# Patient Record
Sex: Male | Born: 1945 | Race: White | Hispanic: No | Marital: Married
Health system: Southern US, Community
[De-identification: ages and names within clinical notes are randomized; demographics above are authoritative.]

## PROBLEM LIST (undated history)

## (undated) DIAGNOSIS — N184 Chronic kidney disease, stage 4 (severe): Secondary | ICD-10-CM

## (undated) DIAGNOSIS — G4733 Obstructive sleep apnea (adult) (pediatric): Secondary | ICD-10-CM

## (undated) DIAGNOSIS — I2510 Atherosclerotic heart disease of native coronary artery without angina pectoris: Secondary | ICD-10-CM

## (undated) DIAGNOSIS — Z9989 Dependence on other enabling machines and devices: Secondary | ICD-10-CM

## (undated) DIAGNOSIS — I6529 Occlusion and stenosis of unspecified carotid artery: Secondary | ICD-10-CM

## (undated) DIAGNOSIS — I5032 Chronic diastolic (congestive) heart failure: Secondary | ICD-10-CM

## (undated) HISTORY — PX: CORONARY ARTERY BYPASS GRAFT: SHX141

## (undated) HISTORY — PX: TONSILLECTOMY: SUR1361

---

## 2015-02-28 DIAGNOSIS — H40013 Open angle with borderline findings, low risk, bilateral: Secondary | ICD-10-CM | POA: Diagnosis not present

## 2015-02-28 DIAGNOSIS — H47323 Drusen of optic disc, bilateral: Secondary | ICD-10-CM | POA: Diagnosis not present

## 2015-03-21 DIAGNOSIS — N189 Chronic kidney disease, unspecified: Secondary | ICD-10-CM | POA: Diagnosis not present

## 2015-03-21 DIAGNOSIS — G4733 Obstructive sleep apnea (adult) (pediatric): Secondary | ICD-10-CM | POA: Diagnosis not present

## 2015-03-21 DIAGNOSIS — Z6839 Body mass index (BMI) 39.0-39.9, adult: Secondary | ICD-10-CM | POA: Diagnosis not present

## 2015-03-21 DIAGNOSIS — I10 Essential (primary) hypertension: Secondary | ICD-10-CM | POA: Diagnosis not present

## 2015-03-21 DIAGNOSIS — R05 Cough: Secondary | ICD-10-CM | POA: Diagnosis not present

## 2015-04-13 DIAGNOSIS — Z79899 Other long term (current) drug therapy: Secondary | ICD-10-CM | POA: Diagnosis not present

## 2015-04-13 DIAGNOSIS — E7800 Pure hypercholesterolemia, unspecified: Secondary | ICD-10-CM | POA: Diagnosis not present

## 2015-04-13 DIAGNOSIS — J209 Acute bronchitis, unspecified: Secondary | ICD-10-CM | POA: Diagnosis not present

## 2015-04-13 DIAGNOSIS — Z7982 Long term (current) use of aspirin: Secondary | ICD-10-CM | POA: Diagnosis not present

## 2015-04-13 DIAGNOSIS — I10 Essential (primary) hypertension: Secondary | ICD-10-CM | POA: Diagnosis not present

## 2015-04-13 DIAGNOSIS — J40 Bronchitis, not specified as acute or chronic: Secondary | ICD-10-CM | POA: Diagnosis not present

## 2015-04-13 DIAGNOSIS — Z7901 Long term (current) use of anticoagulants: Secondary | ICD-10-CM | POA: Diagnosis not present

## 2015-04-13 DIAGNOSIS — R509 Fever, unspecified: Secondary | ICD-10-CM | POA: Diagnosis not present

## 2015-04-13 DIAGNOSIS — R05 Cough: Secondary | ICD-10-CM | POA: Diagnosis not present

## 2015-04-13 DIAGNOSIS — I252 Old myocardial infarction: Secondary | ICD-10-CM | POA: Diagnosis not present

## 2015-05-01 DIAGNOSIS — R9431 Abnormal electrocardiogram [ECG] [EKG]: Secondary | ICD-10-CM | POA: Diagnosis not present

## 2015-05-01 DIAGNOSIS — I2510 Atherosclerotic heart disease of native coronary artery without angina pectoris: Secondary | ICD-10-CM | POA: Diagnosis not present

## 2015-05-01 DIAGNOSIS — I209 Angina pectoris, unspecified: Secondary | ICD-10-CM | POA: Diagnosis not present

## 2015-05-01 DIAGNOSIS — I340 Nonrheumatic mitral (valve) insufficiency: Secondary | ICD-10-CM | POA: Diagnosis not present

## 2015-05-11 DIAGNOSIS — L400 Psoriasis vulgaris: Secondary | ICD-10-CM | POA: Diagnosis not present

## 2015-05-11 DIAGNOSIS — L4059 Other psoriatic arthropathy: Secondary | ICD-10-CM | POA: Diagnosis not present

## 2015-07-24 DIAGNOSIS — Z79899 Other long term (current) drug therapy: Secondary | ICD-10-CM | POA: Diagnosis not present

## 2015-07-24 DIAGNOSIS — E785 Hyperlipidemia, unspecified: Secondary | ICD-10-CM | POA: Diagnosis not present

## 2015-08-09 DIAGNOSIS — Z8669 Personal history of other diseases of the nervous system and sense organs: Secondary | ICD-10-CM | POA: Diagnosis not present

## 2015-08-09 DIAGNOSIS — L4059 Other psoriatic arthropathy: Secondary | ICD-10-CM | POA: Diagnosis not present

## 2015-08-09 DIAGNOSIS — H2513 Age-related nuclear cataract, bilateral: Secondary | ICD-10-CM | POA: Diagnosis not present

## 2015-08-09 DIAGNOSIS — H40013 Open angle with borderline findings, low risk, bilateral: Secondary | ICD-10-CM | POA: Diagnosis not present

## 2015-08-09 DIAGNOSIS — H47323 Drusen of optic disc, bilateral: Secondary | ICD-10-CM | POA: Diagnosis not present

## 2015-10-10 DIAGNOSIS — Z0000 Encounter for general adult medical examination without abnormal findings: Secondary | ICD-10-CM | POA: Diagnosis not present

## 2015-10-10 DIAGNOSIS — N184 Chronic kidney disease, stage 4 (severe): Secondary | ICD-10-CM | POA: Diagnosis not present

## 2015-10-10 DIAGNOSIS — I2510 Atherosclerotic heart disease of native coronary artery without angina pectoris: Secondary | ICD-10-CM | POA: Diagnosis not present

## 2015-10-10 DIAGNOSIS — I10 Essential (primary) hypertension: Secondary | ICD-10-CM | POA: Diagnosis not present

## 2015-10-17 DIAGNOSIS — N184 Chronic kidney disease, stage 4 (severe): Secondary | ICD-10-CM | POA: Diagnosis not present

## 2015-10-17 DIAGNOSIS — I129 Hypertensive chronic kidney disease with stage 1 through stage 4 chronic kidney disease, or unspecified chronic kidney disease: Secondary | ICD-10-CM | POA: Diagnosis not present

## 2015-10-17 DIAGNOSIS — R739 Hyperglycemia, unspecified: Secondary | ICD-10-CM | POA: Diagnosis not present

## 2015-10-17 DIAGNOSIS — I2510 Atherosclerotic heart disease of native coronary artery without angina pectoris: Secondary | ICD-10-CM | POA: Diagnosis not present

## 2015-11-05 DIAGNOSIS — R739 Hyperglycemia, unspecified: Secondary | ICD-10-CM | POA: Diagnosis not present

## 2015-11-05 DIAGNOSIS — N281 Cyst of kidney, acquired: Secondary | ICD-10-CM | POA: Diagnosis not present

## 2015-11-05 DIAGNOSIS — I10 Essential (primary) hypertension: Secondary | ICD-10-CM | POA: Diagnosis not present

## 2015-11-05 DIAGNOSIS — I2510 Atherosclerotic heart disease of native coronary artery without angina pectoris: Secondary | ICD-10-CM | POA: Diagnosis not present

## 2015-11-05 DIAGNOSIS — R339 Retention of urine, unspecified: Secondary | ICD-10-CM | POA: Diagnosis not present

## 2015-11-05 DIAGNOSIS — N184 Chronic kidney disease, stage 4 (severe): Secondary | ICD-10-CM | POA: Diagnosis not present

## 2015-11-05 DIAGNOSIS — Z0000 Encounter for general adult medical examination without abnormal findings: Secondary | ICD-10-CM | POA: Diagnosis not present

## 2015-11-06 DIAGNOSIS — B078 Other viral warts: Secondary | ICD-10-CM | POA: Diagnosis not present

## 2015-11-06 DIAGNOSIS — L538 Other specified erythematous conditions: Secondary | ICD-10-CM | POA: Diagnosis not present

## 2015-11-06 DIAGNOSIS — L821 Other seborrheic keratosis: Secondary | ICD-10-CM | POA: Diagnosis not present

## 2015-11-06 DIAGNOSIS — R238 Other skin changes: Secondary | ICD-10-CM | POA: Diagnosis not present

## 2015-11-06 DIAGNOSIS — L4059 Other psoriatic arthropathy: Secondary | ICD-10-CM | POA: Diagnosis not present

## 2015-11-07 DIAGNOSIS — N184 Chronic kidney disease, stage 4 (severe): Secondary | ICD-10-CM | POA: Diagnosis not present

## 2015-11-07 DIAGNOSIS — I2510 Atherosclerotic heart disease of native coronary artery without angina pectoris: Secondary | ICD-10-CM | POA: Diagnosis not present

## 2015-11-07 DIAGNOSIS — I129 Hypertensive chronic kidney disease with stage 1 through stage 4 chronic kidney disease, or unspecified chronic kidney disease: Secondary | ICD-10-CM | POA: Diagnosis not present

## 2015-11-30 DIAGNOSIS — N184 Chronic kidney disease, stage 4 (severe): Secondary | ICD-10-CM | POA: Diagnosis not present

## 2015-11-30 DIAGNOSIS — I10 Essential (primary) hypertension: Secondary | ICD-10-CM | POA: Diagnosis not present

## 2015-12-05 DIAGNOSIS — I10 Essential (primary) hypertension: Secondary | ICD-10-CM | POA: Diagnosis not present

## 2015-12-05 DIAGNOSIS — N184 Chronic kidney disease, stage 4 (severe): Secondary | ICD-10-CM | POA: Diagnosis not present

## 2015-12-05 DIAGNOSIS — I2510 Atherosclerotic heart disease of native coronary artery without angina pectoris: Secondary | ICD-10-CM | POA: Diagnosis not present

## 2015-12-16 DIAGNOSIS — Z951 Presence of aortocoronary bypass graft: Secondary | ICD-10-CM | POA: Diagnosis not present

## 2015-12-16 DIAGNOSIS — Z7902 Long term (current) use of antithrombotics/antiplatelets: Secondary | ICD-10-CM | POA: Diagnosis not present

## 2015-12-16 DIAGNOSIS — Z7982 Long term (current) use of aspirin: Secondary | ICD-10-CM | POA: Diagnosis not present

## 2015-12-16 DIAGNOSIS — I252 Old myocardial infarction: Secondary | ICD-10-CM | POA: Diagnosis not present

## 2015-12-16 DIAGNOSIS — Z79899 Other long term (current) drug therapy: Secondary | ICD-10-CM | POA: Diagnosis not present

## 2015-12-16 DIAGNOSIS — Z888 Allergy status to other drugs, medicaments and biological substances status: Secondary | ICD-10-CM | POA: Diagnosis not present

## 2015-12-16 DIAGNOSIS — Z8679 Personal history of other diseases of the circulatory system: Secondary | ICD-10-CM | POA: Diagnosis not present

## 2015-12-16 DIAGNOSIS — Z955 Presence of coronary angioplasty implant and graft: Secondary | ICD-10-CM | POA: Diagnosis not present

## 2015-12-16 DIAGNOSIS — F419 Anxiety disorder, unspecified: Secondary | ICD-10-CM | POA: Diagnosis not present

## 2015-12-16 DIAGNOSIS — E7800 Pure hypercholesterolemia, unspecified: Secondary | ICD-10-CM | POA: Diagnosis not present

## 2015-12-16 DIAGNOSIS — H578 Other specified disorders of eye and adnexa: Secondary | ICD-10-CM | POA: Diagnosis not present

## 2015-12-16 DIAGNOSIS — F329 Major depressive disorder, single episode, unspecified: Secondary | ICD-10-CM | POA: Diagnosis not present

## 2015-12-16 DIAGNOSIS — H1132 Conjunctival hemorrhage, left eye: Secondary | ICD-10-CM | POA: Diagnosis not present

## 2015-12-18 DIAGNOSIS — Z23 Encounter for immunization: Secondary | ICD-10-CM | POA: Diagnosis not present

## 2016-01-01 DIAGNOSIS — R9431 Abnormal electrocardiogram [ECG] [EKG]: Secondary | ICD-10-CM | POA: Diagnosis not present

## 2016-01-01 DIAGNOSIS — I25111 Atherosclerotic heart disease of native coronary artery with angina pectoris with documented spasm: Secondary | ICD-10-CM | POA: Diagnosis not present

## 2016-01-01 DIAGNOSIS — Z951 Presence of aortocoronary bypass graft: Secondary | ICD-10-CM | POA: Diagnosis not present

## 2016-01-01 DIAGNOSIS — I10 Essential (primary) hypertension: Secondary | ICD-10-CM | POA: Diagnosis not present

## 2016-01-22 DIAGNOSIS — R0989 Other specified symptoms and signs involving the circulatory and respiratory systems: Secondary | ICD-10-CM | POA: Diagnosis not present

## 2016-02-04 DIAGNOSIS — H2513 Age-related nuclear cataract, bilateral: Secondary | ICD-10-CM | POA: Diagnosis not present

## 2016-02-04 DIAGNOSIS — H47323 Drusen of optic disc, bilateral: Secondary | ICD-10-CM | POA: Diagnosis not present

## 2016-02-04 DIAGNOSIS — H40013 Open angle with borderline findings, low risk, bilateral: Secondary | ICD-10-CM | POA: Diagnosis not present

## 2016-02-04 DIAGNOSIS — H35039 Hypertensive retinopathy, unspecified eye: Secondary | ICD-10-CM | POA: Diagnosis not present

## 2016-02-07 DIAGNOSIS — N184 Chronic kidney disease, stage 4 (severe): Secondary | ICD-10-CM | POA: Diagnosis not present

## 2016-02-07 DIAGNOSIS — E782 Mixed hyperlipidemia: Secondary | ICD-10-CM | POA: Diagnosis not present

## 2016-02-07 DIAGNOSIS — I6523 Occlusion and stenosis of bilateral carotid arteries: Secondary | ICD-10-CM | POA: Diagnosis not present

## 2016-02-07 DIAGNOSIS — Z951 Presence of aortocoronary bypass graft: Secondary | ICD-10-CM | POA: Diagnosis not present

## 2016-02-22 DIAGNOSIS — Z951 Presence of aortocoronary bypass graft: Secondary | ICD-10-CM | POA: Diagnosis not present

## 2016-02-22 DIAGNOSIS — N184 Chronic kidney disease, stage 4 (severe): Secondary | ICD-10-CM | POA: Diagnosis not present

## 2016-02-22 DIAGNOSIS — I6523 Occlusion and stenosis of bilateral carotid arteries: Secondary | ICD-10-CM | POA: Diagnosis not present

## 2016-02-22 DIAGNOSIS — E782 Mixed hyperlipidemia: Secondary | ICD-10-CM | POA: Diagnosis not present

## 2016-02-27 DIAGNOSIS — I10 Essential (primary) hypertension: Secondary | ICD-10-CM | POA: Diagnosis not present

## 2016-03-05 DIAGNOSIS — I6529 Occlusion and stenosis of unspecified carotid artery: Secondary | ICD-10-CM | POA: Diagnosis not present

## 2016-03-05 DIAGNOSIS — R809 Proteinuria, unspecified: Secondary | ICD-10-CM | POA: Diagnosis not present

## 2016-03-05 DIAGNOSIS — I129 Hypertensive chronic kidney disease with stage 1 through stage 4 chronic kidney disease, or unspecified chronic kidney disease: Secondary | ICD-10-CM | POA: Diagnosis not present

## 2016-03-05 DIAGNOSIS — N184 Chronic kidney disease, stage 4 (severe): Secondary | ICD-10-CM | POA: Diagnosis not present

## 2016-03-05 DIAGNOSIS — I2510 Atherosclerotic heart disease of native coronary artery without angina pectoris: Secondary | ICD-10-CM | POA: Diagnosis not present

## 2016-03-18 DIAGNOSIS — L910 Hypertrophic scar: Secondary | ICD-10-CM | POA: Diagnosis not present

## 2016-03-18 DIAGNOSIS — L400 Psoriasis vulgaris: Secondary | ICD-10-CM | POA: Diagnosis not present

## 2016-05-14 DIAGNOSIS — Z7982 Long term (current) use of aspirin: Secondary | ICD-10-CM | POA: Diagnosis not present

## 2016-05-14 DIAGNOSIS — J069 Acute upper respiratory infection, unspecified: Secondary | ICD-10-CM | POA: Diagnosis not present

## 2016-05-14 DIAGNOSIS — N186 End stage renal disease: Secondary | ICD-10-CM | POA: Diagnosis not present

## 2016-05-14 DIAGNOSIS — R05 Cough: Secondary | ICD-10-CM | POA: Diagnosis not present

## 2016-05-14 DIAGNOSIS — Z7902 Long term (current) use of antithrombotics/antiplatelets: Secondary | ICD-10-CM | POA: Diagnosis not present

## 2016-05-14 DIAGNOSIS — Z888 Allergy status to other drugs, medicaments and biological substances status: Secondary | ICD-10-CM | POA: Diagnosis not present

## 2016-05-14 DIAGNOSIS — Z951 Presence of aortocoronary bypass graft: Secondary | ICD-10-CM | POA: Diagnosis not present

## 2016-05-14 DIAGNOSIS — I252 Old myocardial infarction: Secondary | ICD-10-CM | POA: Diagnosis not present

## 2016-05-14 DIAGNOSIS — Z955 Presence of coronary angioplasty implant and graft: Secondary | ICD-10-CM | POA: Diagnosis not present

## 2016-05-14 DIAGNOSIS — Z8679 Personal history of other diseases of the circulatory system: Secondary | ICD-10-CM | POA: Diagnosis not present

## 2016-05-14 DIAGNOSIS — I120 Hypertensive chronic kidney disease with stage 5 chronic kidney disease or end stage renal disease: Secondary | ICD-10-CM | POA: Diagnosis not present

## 2016-05-14 DIAGNOSIS — Z87891 Personal history of nicotine dependence: Secondary | ICD-10-CM | POA: Diagnosis not present

## 2016-05-14 DIAGNOSIS — Z79899 Other long term (current) drug therapy: Secondary | ICD-10-CM | POA: Diagnosis not present

## 2016-05-20 DIAGNOSIS — I2510 Atherosclerotic heart disease of native coronary artery without angina pectoris: Secondary | ICD-10-CM | POA: Diagnosis not present

## 2016-05-20 DIAGNOSIS — E785 Hyperlipidemia, unspecified: Secondary | ICD-10-CM | POA: Diagnosis not present

## 2016-05-20 DIAGNOSIS — I509 Heart failure, unspecified: Secondary | ICD-10-CM | POA: Diagnosis not present

## 2016-05-20 DIAGNOSIS — S299XXA Unspecified injury of thorax, initial encounter: Secondary | ICD-10-CM | POA: Diagnosis not present

## 2016-05-20 DIAGNOSIS — I252 Old myocardial infarction: Secondary | ICD-10-CM | POA: Diagnosis not present

## 2016-05-20 DIAGNOSIS — J069 Acute upper respiratory infection, unspecified: Secondary | ICD-10-CM | POA: Diagnosis not present

## 2016-05-20 DIAGNOSIS — R0600 Dyspnea, unspecified: Secondary | ICD-10-CM | POA: Diagnosis not present

## 2016-05-20 DIAGNOSIS — R05 Cough: Secondary | ICD-10-CM | POA: Diagnosis not present

## 2016-05-20 DIAGNOSIS — N189 Chronic kidney disease, unspecified: Secondary | ICD-10-CM | POA: Diagnosis not present

## 2016-05-20 DIAGNOSIS — I130 Hypertensive heart and chronic kidney disease with heart failure and stage 1 through stage 4 chronic kidney disease, or unspecified chronic kidney disease: Secondary | ICD-10-CM | POA: Diagnosis not present

## 2016-05-23 DIAGNOSIS — N184 Chronic kidney disease, stage 4 (severe): Secondary | ICD-10-CM | POA: Diagnosis not present

## 2016-05-28 DIAGNOSIS — Z7902 Long term (current) use of antithrombotics/antiplatelets: Secondary | ICD-10-CM | POA: Diagnosis not present

## 2016-05-28 DIAGNOSIS — R1012 Left upper quadrant pain: Secondary | ICD-10-CM | POA: Diagnosis not present

## 2016-05-28 DIAGNOSIS — R51 Headache: Secondary | ICD-10-CM | POA: Diagnosis not present

## 2016-05-28 DIAGNOSIS — Z79899 Other long term (current) drug therapy: Secondary | ICD-10-CM | POA: Diagnosis not present

## 2016-05-28 DIAGNOSIS — J40 Bronchitis, not specified as acute or chronic: Secondary | ICD-10-CM | POA: Diagnosis not present

## 2016-05-28 DIAGNOSIS — J90 Pleural effusion, not elsewhere classified: Secondary | ICD-10-CM | POA: Diagnosis not present

## 2016-05-28 DIAGNOSIS — Z7982 Long term (current) use of aspirin: Secondary | ICD-10-CM | POA: Diagnosis not present

## 2016-05-28 DIAGNOSIS — R05 Cough: Secondary | ICD-10-CM | POA: Diagnosis not present

## 2016-05-28 DIAGNOSIS — R1110 Vomiting, unspecified: Secondary | ICD-10-CM | POA: Diagnosis not present

## 2016-05-28 DIAGNOSIS — R0600 Dyspnea, unspecified: Secondary | ICD-10-CM | POA: Diagnosis not present

## 2016-06-09 DIAGNOSIS — J209 Acute bronchitis, unspecified: Secondary | ICD-10-CM | POA: Diagnosis not present

## 2016-06-10 DIAGNOSIS — I130 Hypertensive heart and chronic kidney disease with heart failure and stage 1 through stage 4 chronic kidney disease, or unspecified chronic kidney disease: Secondary | ICD-10-CM | POA: Diagnosis not present

## 2016-06-10 DIAGNOSIS — Z6841 Body Mass Index (BMI) 40.0 and over, adult: Secondary | ICD-10-CM | POA: Diagnosis not present

## 2016-06-10 DIAGNOSIS — I5022 Chronic systolic (congestive) heart failure: Secondary | ICD-10-CM | POA: Diagnosis not present

## 2016-06-10 DIAGNOSIS — I25700 Atherosclerosis of coronary artery bypass graft(s), unspecified, with unstable angina pectoris: Secondary | ICD-10-CM | POA: Diagnosis not present

## 2016-06-10 DIAGNOSIS — R079 Chest pain, unspecified: Secondary | ICD-10-CM | POA: Diagnosis not present

## 2016-06-10 DIAGNOSIS — I214 Non-ST elevation (NSTEMI) myocardial infarction: Secondary | ICD-10-CM | POA: Diagnosis not present

## 2016-06-10 DIAGNOSIS — I25110 Atherosclerotic heart disease of native coronary artery with unstable angina pectoris: Secondary | ICD-10-CM | POA: Diagnosis not present

## 2016-06-10 DIAGNOSIS — I2510 Atherosclerotic heart disease of native coronary artery without angina pectoris: Secondary | ICD-10-CM | POA: Diagnosis not present

## 2016-06-10 DIAGNOSIS — I10 Essential (primary) hypertension: Secondary | ICD-10-CM | POA: Diagnosis not present

## 2016-06-10 DIAGNOSIS — E782 Mixed hyperlipidemia: Secondary | ICD-10-CM | POA: Diagnosis not present

## 2016-06-10 DIAGNOSIS — N184 Chronic kidney disease, stage 4 (severe): Secondary | ICD-10-CM | POA: Diagnosis not present

## 2016-06-11 DIAGNOSIS — R002 Palpitations: Secondary | ICD-10-CM | POA: Diagnosis not present

## 2016-06-11 DIAGNOSIS — I10 Essential (primary) hypertension: Secondary | ICD-10-CM | POA: Diagnosis not present

## 2016-06-11 DIAGNOSIS — E669 Obesity, unspecified: Secondary | ICD-10-CM | POA: Diagnosis present

## 2016-06-11 DIAGNOSIS — I214 Non-ST elevation (NSTEMI) myocardial infarction: Secondary | ICD-10-CM | POA: Diagnosis present

## 2016-06-11 DIAGNOSIS — N184 Chronic kidney disease, stage 4 (severe): Secondary | ICD-10-CM | POA: Diagnosis present

## 2016-06-11 DIAGNOSIS — I25110 Atherosclerotic heart disease of native coronary artery with unstable angina pectoris: Secondary | ICD-10-CM | POA: Diagnosis not present

## 2016-06-11 DIAGNOSIS — J209 Acute bronchitis, unspecified: Secondary | ICD-10-CM | POA: Diagnosis present

## 2016-06-11 DIAGNOSIS — E785 Hyperlipidemia, unspecified: Secondary | ICD-10-CM | POA: Diagnosis present

## 2016-06-11 DIAGNOSIS — Z955 Presence of coronary angioplasty implant and graft: Secondary | ICD-10-CM | POA: Diagnosis not present

## 2016-06-11 DIAGNOSIS — I2510 Atherosclerotic heart disease of native coronary artery without angina pectoris: Secondary | ICD-10-CM | POA: Diagnosis present

## 2016-06-11 DIAGNOSIS — R001 Bradycardia, unspecified: Secondary | ICD-10-CM | POA: Diagnosis present

## 2016-06-11 DIAGNOSIS — I130 Hypertensive heart and chronic kidney disease with heart failure and stage 1 through stage 4 chronic kidney disease, or unspecified chronic kidney disease: Secondary | ICD-10-CM | POA: Diagnosis present

## 2016-06-11 DIAGNOSIS — G4733 Obstructive sleep apnea (adult) (pediatric): Secondary | ICD-10-CM | POA: Diagnosis present

## 2016-06-11 DIAGNOSIS — R771 Abnormality of globulin: Secondary | ICD-10-CM | POA: Diagnosis present

## 2016-06-11 DIAGNOSIS — E782 Mixed hyperlipidemia: Secondary | ICD-10-CM | POA: Diagnosis not present

## 2016-06-11 DIAGNOSIS — Z951 Presence of aortocoronary bypass graft: Secondary | ICD-10-CM | POA: Diagnosis not present

## 2016-06-11 DIAGNOSIS — R079 Chest pain, unspecified: Secondary | ICD-10-CM | POA: Diagnosis not present

## 2016-06-11 DIAGNOSIS — I5022 Chronic systolic (congestive) heart failure: Secondary | ICD-10-CM | POA: Diagnosis present

## 2016-06-11 DIAGNOSIS — I25700 Atherosclerosis of coronary artery bypass graft(s), unspecified, with unstable angina pectoris: Secondary | ICD-10-CM | POA: Diagnosis not present

## 2016-06-11 DIAGNOSIS — Z6841 Body Mass Index (BMI) 40.0 and over, adult: Secondary | ICD-10-CM | POA: Diagnosis not present

## 2016-06-19 DIAGNOSIS — Z6838 Body mass index (BMI) 38.0-38.9, adult: Secondary | ICD-10-CM | POA: Diagnosis not present

## 2016-06-19 DIAGNOSIS — I214 Non-ST elevation (NSTEMI) myocardial infarction: Secondary | ICD-10-CM | POA: Diagnosis not present

## 2016-06-23 DIAGNOSIS — I25110 Atherosclerotic heart disease of native coronary artery with unstable angina pectoris: Secondary | ICD-10-CM | POA: Diagnosis not present

## 2016-06-23 DIAGNOSIS — I068 Other rheumatic aortic valve diseases: Secondary | ICD-10-CM | POA: Diagnosis not present

## 2016-06-23 DIAGNOSIS — I340 Nonrheumatic mitral (valve) insufficiency: Secondary | ICD-10-CM | POA: Diagnosis not present

## 2016-06-27 DIAGNOSIS — I129 Hypertensive chronic kidney disease with stage 1 through stage 4 chronic kidney disease, or unspecified chronic kidney disease: Secondary | ICD-10-CM | POA: Diagnosis not present

## 2016-06-27 DIAGNOSIS — R739 Hyperglycemia, unspecified: Secondary | ICD-10-CM | POA: Diagnosis not present

## 2016-06-27 DIAGNOSIS — I6529 Occlusion and stenosis of unspecified carotid artery: Secondary | ICD-10-CM | POA: Diagnosis not present

## 2016-06-27 DIAGNOSIS — N184 Chronic kidney disease, stage 4 (severe): Secondary | ICD-10-CM | POA: Diagnosis not present

## 2016-06-27 DIAGNOSIS — I2510 Atherosclerotic heart disease of native coronary artery without angina pectoris: Secondary | ICD-10-CM | POA: Diagnosis not present

## 2016-07-01 DIAGNOSIS — E785 Hyperlipidemia, unspecified: Secondary | ICD-10-CM | POA: Diagnosis not present

## 2016-07-01 DIAGNOSIS — I10 Essential (primary) hypertension: Secondary | ICD-10-CM | POA: Diagnosis not present

## 2016-07-01 DIAGNOSIS — R600 Localized edema: Secondary | ICD-10-CM | POA: Diagnosis not present

## 2016-07-01 DIAGNOSIS — I252 Old myocardial infarction: Secondary | ICD-10-CM | POA: Diagnosis not present

## 2016-07-02 DIAGNOSIS — I2510 Atherosclerotic heart disease of native coronary artery without angina pectoris: Secondary | ICD-10-CM | POA: Diagnosis not present

## 2016-07-02 DIAGNOSIS — I10 Essential (primary) hypertension: Secondary | ICD-10-CM | POA: Diagnosis not present

## 2016-07-02 DIAGNOSIS — N184 Chronic kidney disease, stage 4 (severe): Secondary | ICD-10-CM | POA: Diagnosis not present

## 2016-07-02 DIAGNOSIS — I6529 Occlusion and stenosis of unspecified carotid artery: Secondary | ICD-10-CM | POA: Diagnosis not present

## 2016-07-02 DIAGNOSIS — E785 Hyperlipidemia, unspecified: Secondary | ICD-10-CM | POA: Diagnosis not present

## 2016-07-21 DIAGNOSIS — L309 Dermatitis, unspecified: Secondary | ICD-10-CM | POA: Diagnosis not present

## 2016-07-21 DIAGNOSIS — L4059 Other psoriatic arthropathy: Secondary | ICD-10-CM | POA: Diagnosis not present

## 2016-07-21 DIAGNOSIS — L400 Psoriasis vulgaris: Secondary | ICD-10-CM | POA: Diagnosis not present

## 2016-09-03 DIAGNOSIS — E782 Mixed hyperlipidemia: Secondary | ICD-10-CM | POA: Diagnosis not present

## 2016-09-03 DIAGNOSIS — Z951 Presence of aortocoronary bypass graft: Secondary | ICD-10-CM | POA: Diagnosis not present

## 2016-09-03 DIAGNOSIS — N184 Chronic kidney disease, stage 4 (severe): Secondary | ICD-10-CM | POA: Diagnosis not present

## 2016-09-03 DIAGNOSIS — I6523 Occlusion and stenosis of bilateral carotid arteries: Secondary | ICD-10-CM | POA: Diagnosis not present

## 2016-09-16 DIAGNOSIS — I6523 Occlusion and stenosis of bilateral carotid arteries: Secondary | ICD-10-CM | POA: Diagnosis not present

## 2016-09-17 HISTORY — PX: CAROTID ENDARTERECTOMY: SUR193

## 2016-09-24 DIAGNOSIS — E782 Mixed hyperlipidemia: Secondary | ICD-10-CM | POA: Diagnosis not present

## 2016-09-24 DIAGNOSIS — N184 Chronic kidney disease, stage 4 (severe): Secondary | ICD-10-CM | POA: Diagnosis not present

## 2016-09-24 DIAGNOSIS — I6523 Occlusion and stenosis of bilateral carotid arteries: Secondary | ICD-10-CM | POA: Diagnosis not present

## 2016-09-24 DIAGNOSIS — H40013 Open angle with borderline findings, low risk, bilateral: Secondary | ICD-10-CM | POA: Diagnosis not present

## 2016-09-24 DIAGNOSIS — H47323 Drusen of optic disc, bilateral: Secondary | ICD-10-CM | POA: Diagnosis not present

## 2016-09-24 DIAGNOSIS — Z951 Presence of aortocoronary bypass graft: Secondary | ICD-10-CM | POA: Diagnosis not present

## 2016-09-24 DIAGNOSIS — H40033 Anatomical narrow angle, bilateral: Secondary | ICD-10-CM | POA: Diagnosis not present

## 2016-10-07 DIAGNOSIS — F419 Anxiety disorder, unspecified: Secondary | ICD-10-CM | POA: Diagnosis not present

## 2016-10-07 DIAGNOSIS — Z951 Presence of aortocoronary bypass graft: Secondary | ICD-10-CM | POA: Diagnosis not present

## 2016-10-07 DIAGNOSIS — I129 Hypertensive chronic kidney disease with stage 1 through stage 4 chronic kidney disease, or unspecified chronic kidney disease: Secondary | ICD-10-CM | POA: Diagnosis not present

## 2016-10-07 DIAGNOSIS — Z8249 Family history of ischemic heart disease and other diseases of the circulatory system: Secondary | ICD-10-CM | POA: Diagnosis not present

## 2016-10-07 DIAGNOSIS — I2510 Atherosclerotic heart disease of native coronary artery without angina pectoris: Secondary | ICD-10-CM | POA: Diagnosis not present

## 2016-10-07 DIAGNOSIS — I6523 Occlusion and stenosis of bilateral carotid arteries: Secondary | ICD-10-CM | POA: Diagnosis not present

## 2016-10-07 DIAGNOSIS — N184 Chronic kidney disease, stage 4 (severe): Secondary | ICD-10-CM | POA: Diagnosis not present

## 2016-10-07 DIAGNOSIS — I25118 Atherosclerotic heart disease of native coronary artery with other forms of angina pectoris: Secondary | ICD-10-CM | POA: Diagnosis not present

## 2016-10-07 DIAGNOSIS — R0602 Shortness of breath: Secondary | ICD-10-CM | POA: Diagnosis not present

## 2016-10-07 DIAGNOSIS — L409 Psoriasis, unspecified: Secondary | ICD-10-CM | POA: Diagnosis not present

## 2016-10-07 DIAGNOSIS — Z7982 Long term (current) use of aspirin: Secondary | ICD-10-CM | POA: Diagnosis not present

## 2016-10-07 DIAGNOSIS — Z87891 Personal history of nicotine dependence: Secondary | ICD-10-CM | POA: Diagnosis not present

## 2016-10-07 DIAGNOSIS — M069 Rheumatoid arthritis, unspecified: Secondary | ICD-10-CM | POA: Diagnosis not present

## 2016-10-07 DIAGNOSIS — I6522 Occlusion and stenosis of left carotid artery: Secondary | ICD-10-CM | POA: Diagnosis not present

## 2016-10-07 DIAGNOSIS — I252 Old myocardial infarction: Secondary | ICD-10-CM | POA: Diagnosis not present

## 2016-10-07 DIAGNOSIS — F329 Major depressive disorder, single episode, unspecified: Secondary | ICD-10-CM | POA: Diagnosis not present

## 2016-10-07 DIAGNOSIS — Z8673 Personal history of transient ischemic attack (TIA), and cerebral infarction without residual deficits: Secondary | ICD-10-CM | POA: Diagnosis not present

## 2016-10-07 DIAGNOSIS — M109 Gout, unspecified: Secondary | ICD-10-CM | POA: Diagnosis not present

## 2016-10-07 DIAGNOSIS — E782 Mixed hyperlipidemia: Secondary | ICD-10-CM | POA: Diagnosis not present

## 2016-10-07 DIAGNOSIS — R001 Bradycardia, unspecified: Secondary | ICD-10-CM | POA: Diagnosis not present

## 2016-10-07 DIAGNOSIS — Z7902 Long term (current) use of antithrombotics/antiplatelets: Secondary | ICD-10-CM | POA: Diagnosis not present

## 2016-10-07 DIAGNOSIS — Z955 Presence of coronary angioplasty implant and graft: Secondary | ICD-10-CM | POA: Diagnosis not present

## 2016-10-14 DIAGNOSIS — Z7982 Long term (current) use of aspirin: Secondary | ICD-10-CM | POA: Diagnosis not present

## 2016-10-14 DIAGNOSIS — Z7902 Long term (current) use of antithrombotics/antiplatelets: Secondary | ICD-10-CM | POA: Diagnosis not present

## 2016-10-14 DIAGNOSIS — I2510 Atherosclerotic heart disease of native coronary artery without angina pectoris: Secondary | ICD-10-CM | POA: Diagnosis present

## 2016-10-14 DIAGNOSIS — Z87891 Personal history of nicotine dependence: Secondary | ICD-10-CM | POA: Diagnosis not present

## 2016-10-14 DIAGNOSIS — L409 Psoriasis, unspecified: Secondary | ICD-10-CM | POA: Diagnosis present

## 2016-10-14 DIAGNOSIS — N184 Chronic kidney disease, stage 4 (severe): Secondary | ICD-10-CM | POA: Diagnosis present

## 2016-10-14 DIAGNOSIS — M109 Gout, unspecified: Secondary | ICD-10-CM | POA: Diagnosis present

## 2016-10-14 DIAGNOSIS — I252 Old myocardial infarction: Secondary | ICD-10-CM | POA: Diagnosis not present

## 2016-10-14 DIAGNOSIS — I129 Hypertensive chronic kidney disease with stage 1 through stage 4 chronic kidney disease, or unspecified chronic kidney disease: Secondary | ICD-10-CM | POA: Diagnosis present

## 2016-10-14 DIAGNOSIS — M069 Rheumatoid arthritis, unspecified: Secondary | ICD-10-CM | POA: Diagnosis present

## 2016-10-14 DIAGNOSIS — F419 Anxiety disorder, unspecified: Secondary | ICD-10-CM | POA: Diagnosis present

## 2016-10-14 DIAGNOSIS — I6522 Occlusion and stenosis of left carotid artery: Secondary | ICD-10-CM | POA: Diagnosis present

## 2016-10-14 DIAGNOSIS — Z951 Presence of aortocoronary bypass graft: Secondary | ICD-10-CM | POA: Diagnosis not present

## 2016-10-14 DIAGNOSIS — Z955 Presence of coronary angioplasty implant and graft: Secondary | ICD-10-CM | POA: Diagnosis not present

## 2016-10-14 DIAGNOSIS — F329 Major depressive disorder, single episode, unspecified: Secondary | ICD-10-CM | POA: Diagnosis present

## 2016-10-14 DIAGNOSIS — E782 Mixed hyperlipidemia: Secondary | ICD-10-CM | POA: Diagnosis present

## 2016-10-14 DIAGNOSIS — Z8673 Personal history of transient ischemic attack (TIA), and cerebral infarction without residual deficits: Secondary | ICD-10-CM | POA: Diagnosis not present

## 2016-10-14 DIAGNOSIS — Z8249 Family history of ischemic heart disease and other diseases of the circulatory system: Secondary | ICD-10-CM | POA: Diagnosis not present

## 2016-10-22 DIAGNOSIS — N184 Chronic kidney disease, stage 4 (severe): Secondary | ICD-10-CM | POA: Diagnosis not present

## 2016-10-22 DIAGNOSIS — E782 Mixed hyperlipidemia: Secondary | ICD-10-CM | POA: Diagnosis not present

## 2016-10-22 DIAGNOSIS — I6523 Occlusion and stenosis of bilateral carotid arteries: Secondary | ICD-10-CM | POA: Diagnosis not present

## 2016-10-22 DIAGNOSIS — Z951 Presence of aortocoronary bypass graft: Secondary | ICD-10-CM | POA: Diagnosis not present

## 2016-10-29 DIAGNOSIS — I6529 Occlusion and stenosis of unspecified carotid artery: Secondary | ICD-10-CM | POA: Diagnosis not present

## 2016-10-29 DIAGNOSIS — I2510 Atherosclerotic heart disease of native coronary artery without angina pectoris: Secondary | ICD-10-CM | POA: Diagnosis not present

## 2016-10-29 DIAGNOSIS — N184 Chronic kidney disease, stage 4 (severe): Secondary | ICD-10-CM | POA: Diagnosis not present

## 2016-10-29 DIAGNOSIS — I10 Essential (primary) hypertension: Secondary | ICD-10-CM | POA: Diagnosis not present

## 2016-11-24 DIAGNOSIS — N184 Chronic kidney disease, stage 4 (severe): Secondary | ICD-10-CM | POA: Diagnosis not present

## 2016-11-24 DIAGNOSIS — I6523 Occlusion and stenosis of bilateral carotid arteries: Secondary | ICD-10-CM | POA: Diagnosis not present

## 2016-11-24 DIAGNOSIS — L400 Psoriasis vulgaris: Secondary | ICD-10-CM | POA: Diagnosis not present

## 2016-11-24 DIAGNOSIS — E782 Mixed hyperlipidemia: Secondary | ICD-10-CM | POA: Diagnosis not present

## 2016-11-24 DIAGNOSIS — L218 Other seborrheic dermatitis: Secondary | ICD-10-CM | POA: Diagnosis not present

## 2016-11-24 DIAGNOSIS — L4059 Other psoriatic arthropathy: Secondary | ICD-10-CM | POA: Diagnosis not present

## 2016-11-24 DIAGNOSIS — D692 Other nonthrombocytopenic purpura: Secondary | ICD-10-CM | POA: Diagnosis not present

## 2016-11-24 DIAGNOSIS — L84 Corns and callosities: Secondary | ICD-10-CM | POA: Diagnosis not present

## 2016-11-24 DIAGNOSIS — Z951 Presence of aortocoronary bypass graft: Secondary | ICD-10-CM | POA: Diagnosis not present

## 2016-12-02 DIAGNOSIS — R0602 Shortness of breath: Secondary | ICD-10-CM | POA: Diagnosis not present

## 2016-12-02 DIAGNOSIS — G4733 Obstructive sleep apnea (adult) (pediatric): Secondary | ICD-10-CM | POA: Diagnosis not present

## 2016-12-09 DIAGNOSIS — Z6839 Body mass index (BMI) 39.0-39.9, adult: Secondary | ICD-10-CM | POA: Diagnosis not present

## 2016-12-09 DIAGNOSIS — H81399 Other peripheral vertigo, unspecified ear: Secondary | ICD-10-CM | POA: Diagnosis not present

## 2016-12-18 DIAGNOSIS — R0602 Shortness of breath: Secondary | ICD-10-CM | POA: Diagnosis not present

## 2016-12-18 DIAGNOSIS — R0600 Dyspnea, unspecified: Secondary | ICD-10-CM | POA: Diagnosis not present

## 2017-01-13 DIAGNOSIS — E785 Hyperlipidemia, unspecified: Secondary | ICD-10-CM | POA: Diagnosis not present

## 2017-01-13 DIAGNOSIS — I340 Nonrheumatic mitral (valve) insufficiency: Secondary | ICD-10-CM | POA: Diagnosis not present

## 2017-01-13 DIAGNOSIS — I6523 Occlusion and stenosis of bilateral carotid arteries: Secondary | ICD-10-CM | POA: Diagnosis not present

## 2017-01-13 DIAGNOSIS — I10 Essential (primary) hypertension: Secondary | ICD-10-CM | POA: Diagnosis not present

## 2017-01-18 DIAGNOSIS — Z23 Encounter for immunization: Secondary | ICD-10-CM | POA: Diagnosis not present

## 2017-02-05 DIAGNOSIS — J431 Panlobular emphysema: Secondary | ICD-10-CM | POA: Diagnosis not present

## 2017-02-12 DIAGNOSIS — R739 Hyperglycemia, unspecified: Secondary | ICD-10-CM | POA: Diagnosis not present

## 2017-02-12 DIAGNOSIS — I2510 Atherosclerotic heart disease of native coronary artery without angina pectoris: Secondary | ICD-10-CM | POA: Diagnosis not present

## 2017-02-12 DIAGNOSIS — I10 Essential (primary) hypertension: Secondary | ICD-10-CM | POA: Diagnosis not present

## 2017-02-12 DIAGNOSIS — N184 Chronic kidney disease, stage 4 (severe): Secondary | ICD-10-CM | POA: Diagnosis not present

## 2017-02-12 DIAGNOSIS — I6529 Occlusion and stenosis of unspecified carotid artery: Secondary | ICD-10-CM | POA: Diagnosis not present

## 2017-02-12 DIAGNOSIS — Z0000 Encounter for general adult medical examination without abnormal findings: Secondary | ICD-10-CM | POA: Diagnosis not present

## 2017-02-25 DIAGNOSIS — I10 Essential (primary) hypertension: Secondary | ICD-10-CM | POA: Diagnosis not present

## 2017-02-25 DIAGNOSIS — N184 Chronic kidney disease, stage 4 (severe): Secondary | ICD-10-CM | POA: Diagnosis not present

## 2017-02-25 DIAGNOSIS — I2510 Atherosclerotic heart disease of native coronary artery without angina pectoris: Secondary | ICD-10-CM | POA: Diagnosis not present

## 2017-04-06 DIAGNOSIS — H2513 Age-related nuclear cataract, bilateral: Secondary | ICD-10-CM | POA: Diagnosis not present

## 2017-04-06 DIAGNOSIS — H35039 Hypertensive retinopathy, unspecified eye: Secondary | ICD-10-CM | POA: Diagnosis not present

## 2017-04-06 DIAGNOSIS — H40033 Anatomical narrow angle, bilateral: Secondary | ICD-10-CM | POA: Diagnosis not present

## 2017-04-06 DIAGNOSIS — H527 Unspecified disorder of refraction: Secondary | ICD-10-CM | POA: Diagnosis not present

## 2017-04-06 DIAGNOSIS — H5200 Hypermetropia, unspecified eye: Secondary | ICD-10-CM | POA: Diagnosis not present

## 2017-04-06 DIAGNOSIS — I2510 Atherosclerotic heart disease of native coronary artery without angina pectoris: Secondary | ICD-10-CM | POA: Diagnosis not present

## 2017-04-30 ENCOUNTER — Inpatient Hospital Stay (HOSPITAL_COMMUNITY)
Admission: EM | Admit: 2017-04-30 | Discharge: 2017-05-18 | DRG: 264 | Disposition: A | Discharge status: Home Health | Payer: Medicare Other | Attending: Family Medicine | Admitting: Family Medicine

## 2017-04-30 ENCOUNTER — Other Ambulatory Visit: Payer: Self-pay

## 2017-04-30 DIAGNOSIS — Z515 Encounter for palliative care: Secondary | ICD-10-CM

## 2017-04-30 DIAGNOSIS — N189 Chronic kidney disease, unspecified: Secondary | ICD-10-CM

## 2017-04-30 DIAGNOSIS — I248 Other forms of acute ischemic heart disease: Secondary | ICD-10-CM

## 2017-04-30 DIAGNOSIS — E876 Hypokalemia: Secondary | ICD-10-CM | POA: Diagnosis present

## 2017-04-30 DIAGNOSIS — Z7189 Other specified counseling: Secondary | ICD-10-CM

## 2017-04-30 DIAGNOSIS — J9601 Acute respiratory failure with hypoxia: Secondary | ICD-10-CM | POA: Diagnosis not present

## 2017-04-30 DIAGNOSIS — Z951 Presence of aortocoronary bypass graft: Secondary | ICD-10-CM

## 2017-04-30 DIAGNOSIS — Z9989 Dependence on other enabling machines and devices: Secondary | ICD-10-CM

## 2017-04-30 DIAGNOSIS — R079 Chest pain, unspecified: Secondary | ICD-10-CM | POA: Diagnosis not present

## 2017-04-30 DIAGNOSIS — N179 Acute kidney failure, unspecified: Secondary | ICD-10-CM | POA: Diagnosis present

## 2017-04-30 DIAGNOSIS — F329 Major depressive disorder, single episode, unspecified: Secondary | ICD-10-CM | POA: Diagnosis present

## 2017-04-30 DIAGNOSIS — Z6841 Body Mass Index (BMI) 40.0 and over, adult: Secondary | ICD-10-CM | POA: Diagnosis not present

## 2017-04-30 DIAGNOSIS — E785 Hyperlipidemia, unspecified: Secondary | ICD-10-CM | POA: Diagnosis present

## 2017-04-30 DIAGNOSIS — K5900 Constipation, unspecified: Secondary | ICD-10-CM | POA: Diagnosis present

## 2017-04-30 DIAGNOSIS — D696 Thrombocytopenia, unspecified: Secondary | ICD-10-CM | POA: Diagnosis present

## 2017-04-30 DIAGNOSIS — G4700 Insomnia, unspecified: Secondary | ICD-10-CM | POA: Diagnosis present

## 2017-04-30 DIAGNOSIS — I200 Unstable angina: Secondary | ICD-10-CM | POA: Diagnosis not present

## 2017-04-30 DIAGNOSIS — I5043 Acute on chronic combined systolic (congestive) and diastolic (congestive) heart failure: Secondary | ICD-10-CM | POA: Diagnosis not present

## 2017-04-30 DIAGNOSIS — E872 Acidosis: Secondary | ICD-10-CM | POA: Diagnosis present

## 2017-04-30 DIAGNOSIS — I480 Paroxysmal atrial fibrillation: Secondary | ICD-10-CM | POA: Diagnosis present

## 2017-04-30 DIAGNOSIS — R9389 Abnormal findings on diagnostic imaging of other specified body structures: Secondary | ICD-10-CM

## 2017-04-30 DIAGNOSIS — I25110 Atherosclerotic heart disease of native coronary artery with unstable angina pectoris: Principal | ICD-10-CM | POA: Diagnosis present

## 2017-04-30 DIAGNOSIS — N644 Mastodynia: Secondary | ICD-10-CM | POA: Diagnosis present

## 2017-04-30 DIAGNOSIS — R05 Cough: Secondary | ICD-10-CM

## 2017-04-30 DIAGNOSIS — F419 Anxiety disorder, unspecified: Secondary | ICD-10-CM

## 2017-04-30 DIAGNOSIS — F411 Generalized anxiety disorder: Secondary | ICD-10-CM | POA: Diagnosis present

## 2017-04-30 DIAGNOSIS — R0603 Acute respiratory distress: Secondary | ICD-10-CM

## 2017-04-30 DIAGNOSIS — N138 Other obstructive and reflux uropathy: Secondary | ICD-10-CM | POA: Diagnosis present

## 2017-04-30 DIAGNOSIS — I214 Non-ST elevation (NSTEMI) myocardial infarction: Secondary | ICD-10-CM

## 2017-04-30 DIAGNOSIS — I4892 Unspecified atrial flutter: Secondary | ICD-10-CM | POA: Diagnosis not present

## 2017-04-30 DIAGNOSIS — I5033 Acute on chronic diastolic (congestive) heart failure: Secondary | ICD-10-CM

## 2017-04-30 DIAGNOSIS — Z79899 Other long term (current) drug therapy: Secondary | ICD-10-CM

## 2017-04-30 DIAGNOSIS — J440 Chronic obstructive pulmonary disease with acute lower respiratory infection: Secondary | ICD-10-CM | POA: Diagnosis present

## 2017-04-30 DIAGNOSIS — N401 Enlarged prostate with lower urinary tract symptoms: Secondary | ICD-10-CM | POA: Diagnosis present

## 2017-04-30 DIAGNOSIS — I272 Pulmonary hypertension, unspecified: Secondary | ICD-10-CM | POA: Diagnosis present

## 2017-04-30 DIAGNOSIS — I130 Hypertensive heart and chronic kidney disease with heart failure and stage 1 through stage 4 chronic kidney disease, or unspecified chronic kidney disease: Secondary | ICD-10-CM | POA: Diagnosis not present

## 2017-04-30 DIAGNOSIS — D631 Anemia in chronic kidney disease: Secondary | ICD-10-CM | POA: Diagnosis present

## 2017-04-30 DIAGNOSIS — J101 Influenza due to other identified influenza virus with other respiratory manifestations: Secondary | ICD-10-CM

## 2017-04-30 DIAGNOSIS — I10 Essential (primary) hypertension: Secondary | ICD-10-CM | POA: Diagnosis present

## 2017-04-30 DIAGNOSIS — J1000 Influenza due to other identified influenza virus with unspecified type of pneumonia: Secondary | ICD-10-CM | POA: Diagnosis not present

## 2017-04-30 DIAGNOSIS — R059 Cough, unspecified: Secondary | ICD-10-CM

## 2017-04-30 DIAGNOSIS — Z7982 Long term (current) use of aspirin: Secondary | ICD-10-CM

## 2017-04-30 DIAGNOSIS — N184 Chronic kidney disease, stage 4 (severe): Secondary | ICD-10-CM | POA: Diagnosis present

## 2017-04-30 DIAGNOSIS — J811 Chronic pulmonary edema: Secondary | ICD-10-CM

## 2017-04-30 DIAGNOSIS — J209 Acute bronchitis, unspecified: Secondary | ICD-10-CM | POA: Diagnosis not present

## 2017-04-30 DIAGNOSIS — I252 Old myocardial infarction: Secondary | ICD-10-CM

## 2017-04-30 DIAGNOSIS — Z7902 Long term (current) use of antithrombotics/antiplatelets: Secondary | ICD-10-CM

## 2017-04-30 DIAGNOSIS — Z955 Presence of coronary angioplasty implant and graft: Secondary | ICD-10-CM

## 2017-04-30 DIAGNOSIS — G4733 Obstructive sleep apnea (adult) (pediatric): Secondary | ICD-10-CM | POA: Diagnosis present

## 2017-04-30 DIAGNOSIS — R0902 Hypoxemia: Secondary | ICD-10-CM

## 2017-04-30 HISTORY — DX: Obstructive sleep apnea (adult) (pediatric): G47.33

## 2017-04-30 HISTORY — DX: Chronic diastolic (congestive) heart failure: I50.32

## 2017-04-30 HISTORY — DX: Occlusion and stenosis of unspecified carotid artery: I65.29

## 2017-04-30 HISTORY — DX: Morbid (severe) obesity due to excess calories: E66.01

## 2017-04-30 HISTORY — DX: Chronic kidney disease, stage 4 (severe): N18.4

## 2017-04-30 HISTORY — DX: Atherosclerotic heart disease of native coronary artery without angina pectoris: I25.10

## 2017-04-30 HISTORY — DX: Dependence on other enabling machines and devices: Z99.89

## 2017-04-30 NOTE — ED Triage Notes (Signed)
Pt. From home via EMS reports left chest pain that feels like an ache that radiates up to jaw. Pt. Had 4 nitro at home and 1 en route. 324 aspirin.  Pt. Currently has no chest pain. A/O X4. Pt. Has had 2 open heart, 2 heart attacks- last one a year ago and multiple stents.

## 2017-05-01 ENCOUNTER — Other Ambulatory Visit: Payer: Self-pay

## 2017-05-01 ENCOUNTER — Inpatient Hospital Stay (HOSPITAL_COMMUNITY): Payer: Medicare Other

## 2017-05-01 ENCOUNTER — Encounter (HOSPITAL_COMMUNITY): Payer: Self-pay | Admitting: Emergency Medicine

## 2017-05-01 ENCOUNTER — Emergency Department (HOSPITAL_COMMUNITY): Payer: Medicare Other

## 2017-05-01 DIAGNOSIS — Z7982 Long term (current) use of aspirin: Secondary | ICD-10-CM | POA: Diagnosis not present

## 2017-05-01 DIAGNOSIS — I214 Non-ST elevation (NSTEMI) myocardial infarction: Secondary | ICD-10-CM

## 2017-05-01 DIAGNOSIS — I130 Hypertensive heart and chronic kidney disease with heart failure and stage 1 through stage 4 chronic kidney disease, or unspecified chronic kidney disease: Secondary | ICD-10-CM | POA: Diagnosis not present

## 2017-05-01 DIAGNOSIS — Z955 Presence of coronary angioplasty implant and graft: Secondary | ICD-10-CM | POA: Diagnosis not present

## 2017-05-01 DIAGNOSIS — N184 Chronic kidney disease, stage 4 (severe): Secondary | ICD-10-CM | POA: Diagnosis not present

## 2017-05-01 DIAGNOSIS — I200 Unstable angina: Secondary | ICD-10-CM

## 2017-05-01 DIAGNOSIS — Z7189 Other specified counseling: Secondary | ICD-10-CM | POA: Diagnosis not present

## 2017-05-01 DIAGNOSIS — J90 Pleural effusion, not elsewhere classified: Secondary | ICD-10-CM | POA: Diagnosis not present

## 2017-05-01 DIAGNOSIS — J811 Chronic pulmonary edema: Secondary | ICD-10-CM | POA: Diagnosis not present

## 2017-05-01 DIAGNOSIS — R0602 Shortness of breath: Secondary | ICD-10-CM | POA: Diagnosis not present

## 2017-05-01 DIAGNOSIS — I509 Heart failure, unspecified: Secondary | ICD-10-CM | POA: Diagnosis not present

## 2017-05-01 DIAGNOSIS — E872 Acidosis: Secondary | ICD-10-CM | POA: Diagnosis not present

## 2017-05-01 DIAGNOSIS — I10 Essential (primary) hypertension: Secondary | ICD-10-CM | POA: Diagnosis not present

## 2017-05-01 DIAGNOSIS — N189 Chronic kidney disease, unspecified: Secondary | ICD-10-CM | POA: Diagnosis not present

## 2017-05-01 DIAGNOSIS — N401 Enlarged prostate with lower urinary tract symptoms: Secondary | ICD-10-CM | POA: Diagnosis present

## 2017-05-01 DIAGNOSIS — J9601 Acute respiratory failure with hypoxia: Secondary | ICD-10-CM | POA: Diagnosis not present

## 2017-05-01 DIAGNOSIS — Z515 Encounter for palliative care: Secondary | ICD-10-CM | POA: Diagnosis not present

## 2017-05-01 DIAGNOSIS — Z9989 Dependence on other enabling machines and devices: Secondary | ICD-10-CM | POA: Diagnosis not present

## 2017-05-01 DIAGNOSIS — G4733 Obstructive sleep apnea (adult) (pediatric): Secondary | ICD-10-CM

## 2017-05-01 DIAGNOSIS — J440 Chronic obstructive pulmonary disease with acute lower respiratory infection: Secondary | ICD-10-CM | POA: Diagnosis not present

## 2017-05-01 DIAGNOSIS — E876 Hypokalemia: Secondary | ICD-10-CM | POA: Diagnosis present

## 2017-05-01 DIAGNOSIS — J810 Acute pulmonary edema: Secondary | ICD-10-CM | POA: Diagnosis not present

## 2017-05-01 DIAGNOSIS — N185 Chronic kidney disease, stage 5: Secondary | ICD-10-CM | POA: Diagnosis not present

## 2017-05-01 DIAGNOSIS — D631 Anemia in chronic kidney disease: Secondary | ICD-10-CM | POA: Diagnosis present

## 2017-05-01 DIAGNOSIS — E785 Hyperlipidemia, unspecified: Secondary | ICD-10-CM | POA: Diagnosis present

## 2017-05-01 DIAGNOSIS — Z7902 Long term (current) use of antithrombotics/antiplatelets: Secondary | ICD-10-CM | POA: Diagnosis not present

## 2017-05-01 DIAGNOSIS — R079 Chest pain, unspecified: Secondary | ICD-10-CM | POA: Diagnosis not present

## 2017-05-01 DIAGNOSIS — N179 Acute kidney failure, unspecified: Secondary | ICD-10-CM | POA: Diagnosis not present

## 2017-05-01 DIAGNOSIS — I21A1 Myocardial infarction type 2: Secondary | ICD-10-CM | POA: Diagnosis not present

## 2017-05-01 DIAGNOSIS — Z951 Presence of aortocoronary bypass graft: Secondary | ICD-10-CM | POA: Diagnosis not present

## 2017-05-01 DIAGNOSIS — Z6841 Body Mass Index (BMI) 40.0 and over, adult: Secondary | ICD-10-CM | POA: Diagnosis not present

## 2017-05-01 DIAGNOSIS — I6521 Occlusion and stenosis of right carotid artery: Secondary | ICD-10-CM | POA: Diagnosis not present

## 2017-05-01 DIAGNOSIS — J101 Influenza due to other identified influenza virus with other respiratory manifestations: Secondary | ICD-10-CM | POA: Diagnosis not present

## 2017-05-01 DIAGNOSIS — Z79899 Other long term (current) drug therapy: Secondary | ICD-10-CM | POA: Diagnosis not present

## 2017-05-01 DIAGNOSIS — Z01810 Encounter for preprocedural cardiovascular examination: Secondary | ICD-10-CM | POA: Diagnosis not present

## 2017-05-01 DIAGNOSIS — I252 Old myocardial infarction: Secondary | ICD-10-CM | POA: Diagnosis not present

## 2017-05-01 DIAGNOSIS — I5043 Acute on chronic combined systolic (congestive) and diastolic (congestive) heart failure: Secondary | ICD-10-CM | POA: Diagnosis not present

## 2017-05-01 DIAGNOSIS — I4892 Unspecified atrial flutter: Secondary | ICD-10-CM | POA: Diagnosis not present

## 2017-05-01 DIAGNOSIS — I480 Paroxysmal atrial fibrillation: Secondary | ICD-10-CM | POA: Diagnosis not present

## 2017-05-01 DIAGNOSIS — R05 Cough: Secondary | ICD-10-CM | POA: Diagnosis not present

## 2017-05-01 DIAGNOSIS — I361 Nonrheumatic tricuspid (valve) insufficiency: Secondary | ICD-10-CM | POA: Diagnosis not present

## 2017-05-01 DIAGNOSIS — F419 Anxiety disorder, unspecified: Secondary | ICD-10-CM | POA: Diagnosis not present

## 2017-05-01 DIAGNOSIS — N138 Other obstructive and reflux uropathy: Secondary | ICD-10-CM | POA: Diagnosis not present

## 2017-05-01 DIAGNOSIS — R9389 Abnormal findings on diagnostic imaging of other specified body structures: Secondary | ICD-10-CM | POA: Diagnosis not present

## 2017-05-01 DIAGNOSIS — I248 Other forms of acute ischemic heart disease: Secondary | ICD-10-CM | POA: Diagnosis not present

## 2017-05-01 DIAGNOSIS — I5033 Acute on chronic diastolic (congestive) heart failure: Secondary | ICD-10-CM | POA: Diagnosis not present

## 2017-05-01 DIAGNOSIS — J1000 Influenza due to other identified influenza virus with unspecified type of pneumonia: Secondary | ICD-10-CM | POA: Diagnosis not present

## 2017-05-01 DIAGNOSIS — I25110 Atherosclerotic heart disease of native coronary artery with unstable angina pectoris: Secondary | ICD-10-CM | POA: Diagnosis not present

## 2017-05-01 LAB — COMPREHENSIVE METABOLIC PANEL
ALT: 23 U/L (ref 17–63)
AST: 22 U/L (ref 15–41)
Albumin: 3.3 g/dL — ABNORMAL LOW (ref 3.5–5.0)
Alkaline Phosphatase: 32 U/L — ABNORMAL LOW (ref 38–126)
Anion gap: 9 (ref 5–15)
BUN: 65 mg/dL — ABNORMAL HIGH (ref 6–20)
CHLORIDE: 112 mmol/L — AB (ref 101–111)
CO2: 20 mmol/L — ABNORMAL LOW (ref 22–32)
Calcium: 8.5 mg/dL — ABNORMAL LOW (ref 8.9–10.3)
Creatinine, Ser: 2.84 mg/dL — ABNORMAL HIGH (ref 0.61–1.24)
GFR, EST AFRICAN AMERICAN: 24 mL/min — AB (ref >60)
GFR, EST NON AFRICAN AMERICAN: 21 mL/min — AB (ref >60)
Glucose, Bld: 116 mg/dL — ABNORMAL HIGH (ref 65–99)
Potassium: 3.4 mmol/L — ABNORMAL LOW (ref 3.5–5.1)
Sodium: 141 mmol/L (ref 135–145)
Total Bilirubin: 0.5 mg/dL (ref 0.3–1.2)
Total Protein: 6 g/dL — ABNORMAL LOW (ref 6.5–8.1)

## 2017-05-01 LAB — CBC WITH DIFFERENTIAL/PLATELET
Basophils Absolute: 0 10*3/uL (ref 0.0–0.1)
Basophils Relative: 0 %
EOS ABS: 0.5 10*3/uL (ref 0.0–0.7)
EOS PCT: 6 %
HCT: 37.2 % — ABNORMAL LOW (ref 39.0–52.0)
HEMOGLOBIN: 12.7 g/dL — AB (ref 13.0–17.0)
LYMPHS ABS: 1 10*3/uL (ref 0.7–4.0)
Lymphocytes Relative: 10 %
MCH: 33.3 pg (ref 26.0–34.0)
MCHC: 34.1 g/dL (ref 30.0–36.0)
MCV: 97.6 fL (ref 78.0–100.0)
MONOS PCT: 10 %
Monocytes Absolute: 0.9 10*3/uL (ref 0.1–1.0)
Neutro Abs: 7.1 10*3/uL (ref 1.7–7.7)
Neutrophils Relative %: 74 %
PLATELETS: 218 10*3/uL (ref 150–400)
RBC: 3.81 MIL/uL — ABNORMAL LOW (ref 4.22–5.81)
RDW: 13.8 % (ref 11.5–15.5)
WBC: 9.6 10*3/uL (ref 4.0–10.5)

## 2017-05-01 LAB — LIPID PANEL
CHOL/HDL RATIO: 7 ratio
CHOLESTEROL: 224 mg/dL — AB (ref 0–200)
Cholesterol: 232 mg/dL — ABNORMAL HIGH (ref 0–200)
HDL: 28 mg/dL — ABNORMAL LOW (ref >40)
HDL: 33 mg/dL — AB (ref >40)
LDL Cholesterol: 155 mg/dL — ABNORMAL HIGH (ref 0–99)
LDL Cholesterol: 170 mg/dL — ABNORMAL HIGH (ref 0–99)
TRIGLYCERIDES: 147 mg/dL (ref <150)
Total CHOL/HDL Ratio: 8 RATIO
Triglycerides: 207 mg/dL — ABNORMAL HIGH (ref <150)
VLDL: 41 mg/dL — ABNORMAL HIGH (ref 0–40)
VLDL: 29 mg/dL (ref 0–40)

## 2017-05-01 LAB — TROPONIN I
TROPONIN I: 0.04 ng/mL — AB (ref <0.03)
Troponin I: 1.57 ng/mL (ref <0.03)
Troponin I: 2.62 ng/mL (ref <0.03)

## 2017-05-01 LAB — I-STAT CHEM 8, ED
BUN: 60 mg/dL — AB (ref 6–20)
CALCIUM ION: 1.17 mmol/L (ref 1.15–1.40)
Chloride: 109 mmol/L (ref 101–111)
Creatinine, Ser: 3.4 mg/dL — ABNORMAL HIGH (ref 0.61–1.24)
GLUCOSE: 167 mg/dL — AB (ref 65–99)
HCT: 37 % — ABNORMAL LOW (ref 39.0–52.0)
Hemoglobin: 12.6 g/dL — ABNORMAL LOW (ref 13.0–17.0)
Potassium: 3.9 mmol/L (ref 3.5–5.1)
SODIUM: 143 mmol/L (ref 135–145)
TCO2: 22 mmol/L (ref 22–32)

## 2017-05-01 LAB — PROTIME-INR
INR: 1.18
PROTHROMBIN TIME: 14.9 s (ref 11.4–15.2)

## 2017-05-01 LAB — I-STAT TROPONIN, ED: Troponin i, poc: 0 ng/mL (ref 0.00–0.08)

## 2017-05-01 LAB — BRAIN NATRIURETIC PEPTIDE: B NATRIURETIC PEPTIDE 5: 366.5 pg/mL — AB (ref 0.0–100.0)

## 2017-05-01 LAB — MAGNESIUM: MAGNESIUM: 2.1 mg/dL (ref 1.7–2.4)

## 2017-05-01 LAB — HEPARIN LEVEL (UNFRACTIONATED): Heparin Unfractionated: 0.19 IU/mL — ABNORMAL LOW (ref 0.30–0.70)

## 2017-05-01 MED ORDER — CLOPIDOGREL BISULFATE 75 MG PO TABS
75.0000 mg | ORAL_TABLET | Freq: Every day | ORAL | Status: DC
  Administered 2017-05-01 – 2017-05-18 (×17): 75 mg via ORAL
  Filled 2017-05-01 (×18): qty 1

## 2017-05-01 MED ORDER — ISOSORBIDE MONONITRATE ER 30 MG PO TB24: 120.0000 mg | ORAL_TABLET | Freq: Every day | ORAL | Status: DC

## 2017-05-01 MED ORDER — NITROGLYCERIN IN D5W 200-5 MCG/ML-% IV SOLN
0.0000 ug/min | Freq: Once | INTRAVENOUS | Status: AC
  Administered 2017-05-01: 5 ug/min via INTRAVENOUS
  Filled 2017-05-01: qty 250

## 2017-05-01 MED ORDER — ALLOPURINOL 100 MG PO TABS
100.0000 mg | ORAL_TABLET | Freq: Every day | ORAL | Status: DC
  Administered 2017-05-01 – 2017-05-18 (×18): 100 mg via ORAL
  Filled 2017-05-01 (×20): qty 1

## 2017-05-01 MED ORDER — HYDRALAZINE HCL 25 MG PO TABS: 50.0000 mg | ORAL_TABLET | Freq: Two times a day (BID) | ORAL | Status: DC

## 2017-05-01 MED ORDER — POTASSIUM CHLORIDE CRYS ER 20 MEQ PO TBCR
20.0000 meq | EXTENDED_RELEASE_TABLET | Freq: Once | ORAL | Status: AC
  Administered 2017-05-01: 20 meq via ORAL
  Filled 2017-05-01: qty 1

## 2017-05-01 MED ORDER — FUROSEMIDE 20 MG PO TABS: 40.0000 mg | ORAL_TABLET | Freq: Every day | ORAL | Status: DC

## 2017-05-01 MED ORDER — HYDRALAZINE HCL 50 MG PO TABS
75.0000 mg | ORAL_TABLET | Freq: Two times a day (BID) | ORAL | Status: DC
  Administered 2017-05-01 – 2017-05-18 (×32): 75 mg via ORAL
  Filled 2017-05-01 (×32): qty 1

## 2017-05-01 MED ORDER — HEPARIN BOLUS VIA INFUSION
4000.0000 [IU] | Freq: Once | INTRAVENOUS | Status: AC
  Administered 2017-05-01: 4000 [IU] via INTRAVENOUS
  Filled 2017-05-01: qty 4000

## 2017-05-01 MED ORDER — ACETAMINOPHEN 325 MG PO TABS
650.0000 mg | ORAL_TABLET | ORAL | Status: DC | PRN
  Administered 2017-05-01 – 2017-05-05 (×5): 650 mg via ORAL
  Filled 2017-05-01 (×5): qty 2

## 2017-05-01 MED ORDER — ASPIRIN EC 81 MG PO TBEC
81.0000 mg | DELAYED_RELEASE_TABLET | Freq: Every day | ORAL | Status: DC
  Administered 2017-05-01 – 2017-05-14 (×14): 81 mg via ORAL
  Filled 2017-05-01 (×14): qty 1

## 2017-05-01 MED ORDER — DIPHENHYDRAMINE HCL 25 MG PO CAPS
25.0000 mg | ORAL_CAPSULE | Freq: Every evening | ORAL | Status: DC | PRN
  Administered 2017-05-03 – 2017-05-05 (×3): 25 mg via ORAL
  Filled 2017-05-01 (×3): qty 1

## 2017-05-01 MED ORDER — AMLODIPINE BESYLATE 5 MG PO TABS: 5.0000 mg | ORAL_TABLET | Freq: Every day | ORAL | Status: DC

## 2017-05-01 MED ORDER — SODIUM CHLORIDE 0.9 % IV SOLN
1.0000 g | INTRAVENOUS | Status: DC
  Administered 2017-05-02 – 2017-05-03 (×3): 1 g via INTRAVENOUS
  Filled 2017-05-01 (×4): qty 10

## 2017-05-01 MED ORDER — MORPHINE SULFATE (PF) 4 MG/ML IV SOLN
2.0000 mg | INTRAVENOUS | Status: DC | PRN
  Administered 2017-05-01 – 2017-05-07 (×9): 2 mg via INTRAVENOUS
  Filled 2017-05-01 (×9): qty 1

## 2017-05-01 MED ORDER — HEPARIN (PORCINE) IN NACL 100-0.45 UNIT/ML-% IJ SOLN
2100.0000 [IU]/h | INTRAMUSCULAR | Status: DC
  Administered 2017-05-01: 1200 [IU]/h via INTRAVENOUS
  Administered 2017-05-01: 1550 [IU]/h via INTRAVENOUS
  Administered 2017-05-02 – 2017-05-04 (×4): 1750 [IU]/h via INTRAVENOUS
  Administered 2017-05-05: 1800 [IU]/h via INTRAVENOUS
  Administered 2017-05-05: 1750 [IU]/h via INTRAVENOUS
  Administered 2017-05-06: 1800 [IU]/h via INTRAVENOUS
  Filled 2017-05-01 (×10): qty 250

## 2017-05-01 MED ORDER — CEFTRIAXONE SODIUM 1 G IJ SOLR: 1.0000 g | INTRAMUSCULAR | Status: DC

## 2017-05-01 MED ORDER — FLUOXETINE HCL 20 MG PO CAPS
20.0000 mg | ORAL_CAPSULE | Freq: Every day | ORAL | Status: DC
  Administered 2017-05-02 – 2017-05-18 (×17): 20 mg via ORAL
  Filled 2017-05-01 (×18): qty 1

## 2017-05-01 MED ORDER — MELATONIN 3 MG PO TABS
3.0000 mg | ORAL_TABLET | Freq: Every day | ORAL | Status: DC
  Administered 2017-05-01 – 2017-05-17 (×14): 3 mg via ORAL
  Filled 2017-05-01 (×20): qty 1

## 2017-05-01 MED ORDER — SODIUM CHLORIDE 0.9 % IV SOLN
INTRAVENOUS | Status: AC
  Administered 2017-05-01: 18:00:00 via INTRAVENOUS

## 2017-05-01 MED ORDER — FENOFIBRATE 160 MG PO TABS
160.0000 mg | ORAL_TABLET | Freq: Every day | ORAL | Status: DC
  Administered 2017-05-02 – 2017-05-13 (×12): 160 mg via ORAL
  Filled 2017-05-01 (×14): qty 1

## 2017-05-01 MED ORDER — DOXAZOSIN MESYLATE 2 MG PO TABS
4.0000 mg | ORAL_TABLET | Freq: Every day | ORAL | Status: DC
  Administered 2017-05-02 – 2017-05-18 (×17): 4 mg via ORAL
  Filled 2017-05-01 (×2): qty 1
  Filled 2017-05-01: qty 2
  Filled 2017-05-01 (×13): qty 1
  Filled 2017-05-01: qty 2
  Filled 2017-05-01: qty 1
  Filled 2017-05-01: qty 2

## 2017-05-01 MED ORDER — NITROGLYCERIN IN D5W 200-5 MCG/ML-% IV SOLN
0.0000 ug/min | INTRAVENOUS | Status: DC
  Administered 2017-05-01: 5 ug/min via INTRAVENOUS
  Administered 2017-05-02: 15 ug/min via INTRAVENOUS
  Filled 2017-05-01: qty 250

## 2017-05-01 MED ORDER — AMLODIPINE BESYLATE 10 MG PO TABS
10.0000 mg | ORAL_TABLET | Freq: Every day | ORAL | Status: DC
  Administered 2017-05-02 – 2017-05-16 (×15): 10 mg via ORAL
  Filled 2017-05-01 (×15): qty 1

## 2017-05-01 MED ORDER — HYDRALAZINE HCL 20 MG/ML IJ SOLN
10.0000 mg | INTRAMUSCULAR | Status: DC | PRN
Start: 2017-05-01 — End: 2017-05-18
  Administered 2017-05-01: 10 mg via INTRAVENOUS
  Filled 2017-05-01 (×2): qty 1

## 2017-05-01 MED ORDER — SODIUM CHLORIDE 0.9 % IV SOLN
500.0000 mg | INTRAVENOUS | Status: AC
  Administered 2017-05-02 – 2017-05-05 (×5): 500 mg via INTRAVENOUS
  Filled 2017-05-01 (×5): qty 500

## 2017-05-01 MED ORDER — EZETIMIBE 10 MG PO TABS
10.0000 mg | ORAL_TABLET | Freq: Every day | ORAL | Status: DC
  Administered 2017-05-02 – 2017-05-13 (×12): 10 mg via ORAL
  Filled 2017-05-01 (×13): qty 1

## 2017-05-01 MED ORDER — BRIMONIDINE TARTRATE 0.2 % OP SOLN
1.0000 [drp] | OPHTHALMIC | Status: DC
  Administered 2017-05-03 – 2017-05-17 (×6): 1 [drp] via OPHTHALMIC
  Filled 2017-05-01 (×5): qty 5

## 2017-05-01 MED ORDER — METOPROLOL TARTRATE 50 MG PO TABS
50.0000 mg | ORAL_TABLET | Freq: Two times a day (BID) | ORAL | Status: DC
  Administered 2017-05-01 – 2017-05-12 (×22): 50 mg via ORAL
  Filled 2017-05-01 (×21): qty 1
  Filled 2017-05-01: qty 2
  Filled 2017-05-01: qty 1

## 2017-05-01 MED ORDER — HEPARIN BOLUS VIA INFUSION
2500.0000 [IU] | Freq: Once | INTRAVENOUS | Status: AC
  Administered 2017-05-01: 2500 [IU] via INTRAVENOUS
  Filled 2017-05-01: qty 2500

## 2017-05-01 MED ORDER — ONDANSETRON HCL 4 MG/2ML IJ SOLN
4.0000 mg | Freq: Four times a day (QID) | INTRAMUSCULAR | Status: DC | PRN
  Administered 2017-05-06: 4 mg via INTRAVENOUS
  Filled 2017-05-01: qty 2

## 2017-05-01 MED ORDER — GI COCKTAIL ~~LOC~~: 30.0000 mL | Freq: Four times a day (QID) | ORAL | Status: DC | PRN

## 2017-05-01 NOTE — Progress Notes (Signed)
NTG to 20 mcg/min

## 2017-05-01 NOTE — ED Notes (Signed)
ORDERED DIET TRAY FOR PT  

## 2017-05-01 NOTE — ED Notes (Signed)
Cardiology at bedside.

## 2017-05-01 NOTE — Progress Notes (Signed)
Cards PA arrived, EKG repeated.

## 2017-05-01 NOTE — Progress Notes (Signed)
Chest pain continues though somewhat improved. Reports pain as 2.5/10. NTG increased to 15 mcg/min.

## 2017-05-01 NOTE — Progress Notes (Signed)
Patient arrived in room [id number] from the ED. Placed on unit monitoring system. Central tele called.

## 2017-05-01 NOTE — Progress Notes (Signed)
Stat EKG completed.  NTG titrated up to 10 mcg/min at 1725.  MD paged and events reported.

## 2017-05-01 NOTE — Progress Notes (Signed)
The patient has declined the CPAP for tonight stating that he will have his family bring his home CPAP for use.

## 2017-05-01 NOTE — ED Notes (Addendum)
Spoke with Dr. Tamala Julian about continuing nitro drip and recent troponin result. Was told cardizem drip should be titrated down to 5 mcg and to be continued. Will continue to monitor.

## 2017-05-01 NOTE — Progress Notes (Signed)
Patient C/O 3-4/10 which he reports as worse than the pain he presented with in the ED. Morphine 2 mg given IV and oxygen at 2 l/m per cannula begun.  Stat EKG requested.

## 2017-05-01 NOTE — ED Notes (Signed)
Pt placed on hospital bed

## 2017-05-01 NOTE — ED Notes (Signed)
Admitting informed of increased trop, EKG repeated, Cards paged.

## 2017-05-01 NOTE — Progress Notes (Signed)
Chest pain continues at 2/10.  NTG increased to 17.5 mcg/min

## 2017-05-01 NOTE — H&P (Signed)
History and Physical    Nathan Wolfe ZOX:[id number] DOB: [birth year] DOA: 04/30/2017  Referring MD/NP/PA: Dr. Denese Killings PCP: No primary care provider on file.  Patient coming from: home via EMS  Chief Complaint: Chest pain  I have personally briefly reviewed patient's old medical records in [place]   HPI: Nathan Wolfe is a 72 y.o. 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  Patient reports symptoms did not feel like acid reflux.  Reports pain radiating into his jaw similar to previous heart attacks.  Patient reports taking approximately 4 nitros with just temporary relief of symptoms before return of pain symptoms.  En route with EMS patient was given 324 mg.   Patient is from [place] but visiting his daughter for the last week and a half.  They traveled from [place] via car noting driving approximately 6 hours then resting one night and driving additional 6 hours to get here.  He has received all of his medical care at [facility] and is seen by Dr. Darcey Nora of cardiology.  He reports that he is unable to have any more cardiac caths due to poor kidney function and the fact that he does not want to be placed on dialysis.  Also reports that his cardiologist no longer does stress testing because they are always positive.  Since being here patient notes having some burning sensation in his left leg, like stiffness.  Associated symptoms include nonproductive cough and dyspnea on exertion.  He was seen at urgent care recently for his persistent cough and was started on azithromycin.   ED Course: Upon admission into the emergency  department patient was seen to be afebrile, pulse 68-69, respirations 20-26, blood pressure 147/83-177/82, O2 saturations 96% on room air.  Labs revealed WBC 9.6, hemoglobin 12.6, BUN 60, creatinine 3.4, glucose 167, and troponin 0.  Due to recurrent pain patient was started on nitroglycerin drip.  EKG was noted to have significant ST depressions and signs of ischemia.  Cardiology evaluated and started him on a heparin drip.  Patient currently reports being chest pain-free.  TRH called to admit.   Review of Systems  Constitutional: Positive for malaise/fatigue. Negative for fever.       Denies weight gain  HENT: Negative for congestion and sinus pain.   Eyes: Negative for pain and discharge.  Respiratory: Positive for cough, shortness of breath and wheezing. Negative for sputum production.   Cardiovascular: Positive for chest pain and leg swelling.  Gastrointestinal: Negative for abdominal pain, constipation, diarrhea, nausea and vomiting.  Genitourinary: Negative for dysuria and flank pain.  Musculoskeletal: Negative for joint pain and myalgias.  Skin: Negative for itching and rash.  Neurological: Negative for sensory change and seizures.  Psychiatric/Behavioral: Negative for substance abuse. The patient is not nervous/anxious.     Past Medical History:  Diagnosis Date  . CAD (coronary artery disease)   . Carotid artery stenosis   . Chronic kidney disease (CKD), stage IV (severe) ([place])   . Morbid obesity ([place])     Past Surgical History:  Procedure Laterality Date  . CAROTID ENDARTERECTOMY Left 09/2016  . CORONARY ARTERY BYPASS GRAFT       has no tobacco,  alcohol, and drug history on file.  Allergies  Allergen Reactions  . Statins Other (See Comments)    Full body muscle stuffiness/pain    Family history positive for heart disease  No current facility-administered medications on file prior to encounter.    Current Outpatient Medications on File Prior to Encounter    Medication Sig Dispense Refill  . allopurinol (ZYLOPRIM) 100 MG tablet Take 100 mg by mouth daily.    Marland Kitchen amLODipine (NORVASC) 5 MG tablet Take 5 mg by mouth daily.    Marland Kitchen aspirin EC 81 MG tablet Take 81 mg by mouth at bedtime.    Marland Kitchen azithromycin (ZITHROMAX) 250 MG tablet Take 250-500 mg by mouth daily. Take 500 mg for the first day then 250 mg for the next four days    . brimonidine (ALPHAGAN) 0.2 % ophthalmic solution Place 1 drop into both eyes every other day.    . clopidogrel (PLAVIX) 75 MG tablet Take 75 mg by mouth daily.    . diphenhydrAMINE (BENADRYL) 25 MG tablet Take 25 mg by mouth at bedtime.    Marland Kitchen doxazosin (CARDURA) 4 MG tablet Take 4 mg by mouth daily.    . fenofibrate (TRICOR) 145 MG tablet Take 145 mg by mouth daily.    Marland Kitchen FLUoxetine (PROZAC) 20 MG tablet Take 20 mg by mouth daily.    . furosemide (LASIX) 20 MG tablet Take 40 mg by mouth daily.    . hydrALAZINE (APRESOLINE) 50 MG tablet Take 50 mg by mouth 2 (two) times daily.    . isosorbide mononitrate (IMDUR) 60 MG 24 hr tablet Take 120 mg by mouth daily.    . Melatonin 5 MG TABS Take 5 mg by mouth at bedtime.    . metoprolol tartrate (LOPRESSOR) 50 MG tablet Take 50 mg by mouth 2 (two) times daily.    . nitroGLYCERIN (NITROSTAT) 0.4 MG SL tablet Place 0.4 mg under the tongue every 5 (five) minutes as needed for chest pain.    Marland Kitchen omega-3 acid ethyl esters (LOVAZA) 1 g capsule Take 1 g by mouth daily.    . Secukinumab (COSENTYX) 150 MG/ML SOSY Inject 300 mg into the skin every 30 (thirty) days.      Physical Exam:  Constitutional: Obese male in NAD, calm, comfortable Vitals:   04/30/17 2359  BP: (!) 177/82  Pulse: 68  Resp: 20  Temp: 99.2 F (37.3 C)  TempSrc: Oral  SpO2: 96%   Eyes: PERRL, lids and conjunctivae normal ENMT: Mucous membranes are moist. Posterior pharynx clear of any exudate or lesions.   Neck: normal, supple, no masses, no thyromegaly Respiratory: No significant wheezes noted mild crackles noted at  the bases.  Patient able to talk in complete sentences. Cardiovascular: Regular rate and rhythm, positive systolic murmurs.  No rubs / gallops.  Positive trace bilateral lower extremity edema. 2+ pedal pulses. No carotid bruits.  Abdomen: Protuberant abdomen, no tenderness, no masses palpated. No hepatosplenomegaly. Bowel sounds positive.  Musculoskeletal: no clubbing / cyanosis. No joint deformity upper and lower extremities. Good ROM, no contractures. Normal muscle tone.  Skin: no rashes, lesions, ulcers. No induration Neurologic: CN 2-12 grossly intact. Sensation intact, DTR normal. Strength 5/5 in all 4.  Psychiatric: Normal judgment and insight. Alert and oriented x 3. Normal mood.     Labs on Admission: I have personally reviewed following labs and imaging studies  CBC: Recent Labs  Lab 05/01/17 0004 05/01/17 0006  WBC 9.6  --   NEUTROABS 7.1  --  HGB 12.7* 12.6*  HCT 37.2* 37.0*  MCV 97.6  --   PLT 218  --    Basic Metabolic Panel: Recent Labs  Lab 05/01/17 0006  NA 143  K 3.9  CL 109  GLUCOSE 167*  BUN 60*  CREATININE 3.40*   GFR: CrCl cannot be calculated (Unknown ideal weight.). Liver Function Tests: No results for input(s): AST, ALT, ALKPHOS, BILITOT, PROT, ALBUMIN in the last 168 hours. No results for input(s): LIPASE, AMYLASE in the last 168 hours. No results for input(s): AMMONIA in the last 168 hours. Coagulation Profile: No results for input(s): INR, PROTIME in the last 168 hours. Cardiac Enzymes: No results for input(s): CKTOTAL, CKMB, CKMBINDEX, TROPONINI in the last 168 hours. BNP (last 3 results) No results for input(s): PROBNP in the last 8760 hours. HbA1C: No results for input(s): HGBA1C in the last 72 hours. CBG: No results for input(s): GLUCAP in the last 168 hours. Lipid Profile: No results for input(s): CHOL, HDL, LDLCALC, TRIG, CHOLHDL, LDLDIRECT in the last 72 hours. Thyroid Function Tests: No results for input(s): TSH, T4TOTAL,  FREET4, T3FREE, THYROIDAB in the last 72 hours. Anemia Panel: No results for input(s): VITAMINB12, FOLATE, FERRITIN, TIBC, IRON, RETICCTPCT in the last 72 hours. Urine analysis: No results found for: COLORURINE, APPEARANCEUR, LABSPEC, PHURINE, GLUCOSEU, HGBUR, BILIRUBINUR, KETONESUR, PROTEINUR, UROBILINOGEN, NITRITE, LEUKOCYTESUR Sepsis Labs: No results found for this or any previous visit (from the past 240 hour(s)).   Radiological Exams on Admission:  No results found.  EKG: Independently reviewed.  Sinus rhythm at 70 bpm with significant ST depressions concerning for ischemia.  Assessment/Plan Unstable angina: Acute.  Patient presents with left-sided chest pain radiating up to his trauma.  Known history of coronary artery disease.  Patient received 324 mg aspirin in route.  EKG showing significant signs of ischemia.  Cardiology consulted and started patient on heparin drip. - Admit to stepdown bed - Nitroglycerin drip - Heparin drip per pharmacy - Check chest x-ray - Trend cardiac troponins - Recheck EKG in a.m. - Will need to try and obtain records from previous cardiac catheterization - Appreciate cardiology consultative services will follow-up for further recommendations  Essential hypertension - Continue Coreg, hydralazine, isosorbide mononitrate, amlodipine, and furosemide  CKD stage IV: Patient presents with creatinine of 3.4 with BUN 60 is not exactly sure of his baseline creatinine, but notes that his GFR last was somewhere around 23.  He is followed by nephrology but does not want to be on dialysis. - Recheck kidney function in a.m.  Anemia of chronic disease: hemoglobin appears stable at 12.6. - Continue to monitor  BPH - Continue Cardura  Dyslipidemia - Continue home medication  OSA on CPAP - RT to supply CPAP at night  DVT prophylaxis: Heparin Code Status: full Family Communication: Discussed plan of care with the patient family present at  bedside Disposition Plan: To be determined Consults called: Cardiology Admission status: Observation  Norval Morton MD [facility] Pager [phone]   If 7PM-7AM, please contact night-coverage [email or web address] Password [facility]  05/01/2017, 12:56 AM

## 2017-05-01 NOTE — Progress Notes (Signed)
PROGRESS NOTE    Nathan Wolfe  OXB:[id number] DOB: October 31, 1945 DOA: 04/30/2017 PCP: Patient, No Pcp Per   Brief Narrative:  Nathan Wolfe is a 72 y.o. 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  Patient reports symptoms did not feel like acid reflux.  Reports pain radiating into his jaw similar to previous heart attacks.  Patient reports taking approximately 4 nitros with just temporary relief of symptoms before return of pain symptoms.  En route with EMS patient was given 324 mg.   Assessment & Plan:   Active Problems:   Unstable angina (HCC)   Anemia due to chronic kidney disease   Essential hypertension   OSA on CPAP   Unstable angina  NSTEMI:  Acute.  Patient presents with left-sided chest pain radiating up to his trauma.  Known history of coronary artery disease.  Patient received 324 mg aspirin in route.  EKG showing significant signs of ischemia.  Cardiology consulted and started patient on heparin drip. - Nitroglycerin drip - Heparin drip per pharmacy - Check chest x-ray (atelectasis) - Trend cardiac troponins (trending up) - Continue to follow repeat EKG's prn  - Will need to try and obtain records from previous cardiac catheterization - Appreciate cardiology consultative services will follow-up for further recommendations - Of note, pt developed worsening CP during transport.  Cardiology was called and evaluated pt.  Pt with resolved CP at time of their evaluation.  He had ST depression in lateral leads which improved when CP resolved. Recommending to continue uptitrate nitro until pt CP free and transfer patient to 63M.  Discussed  possible transfer to cardiology as primary.    Essential hypertension - Continue Coreg, hydralazine, amlodipine - hold lasix and isosorbide  CKD stage IV: Patient presents with creatinine of 3.4 with BUN 60 is not exactly sure of his baseline creatinine, but notes that his GFR last was somewhere around 23.  He is followed by nephrology but does not want to be on dialysis. - gentle hydration in preparation for cath  Anemia of chronic disease: hemoglobin appears stable at 12.6. - Continue to monitor  BPH - Continue Cardura  Dyslipidemia - Continue home medication  OSA on CPAP - RT to supply CPAP at night  DVT prophylaxis: heparin gtt Code Status: full  Family Communication: none at bedside Disposition Plan: pending cardiology w/u   Consultants:   cardiology  Procedures:   none  Antimicrobials:  none    Subjective: Pain resolved after nitro gtt.  Had L sided constant CP that was partially relieved with nitro x4 attempts then came to ED.  Some radiation to jaw and arm as well, but not as bad as he's had before. Coughing past few days.   Objective: Vitals:   05/01/17 0400 05/01/17 0430 05/01/17 0630 05/01/17 0700  BP: (!) 136/56 (!) 142/61 (!) 177/72 (!) 174/75  Pulse: 62 64 63 63  Resp: 20 19 (!) 23 19  Temp:      TempSrc:      SpO2: 97% 94% 95% 98%    Intake/Output Summary (Last 24 hours) at 05/01/2017 0748 Last data filed at 05/01/2017 0344 Gross per 24 hour  Intake -  Output 450 ml  Net -450 ml   There were no vitals filed for this visit.  Examination:  General exam: Appears calm and comfortable  Respiratory system: Clear to auscultation. Respiratory effort normal. Cardiovascular system: S1 & S2 heard, RRR. No JVD, murmurs, rubs, gallops or clicks. Sternotomy scar.  Gastrointestinal system: Abdomen is nondistended, soft and nontender. No organomegaly or masses felt. Normal bowel sounds heard. Central nervous system: Alert and oriented. No  focal neurological deficits. Extremities: trace edema Skin: No rashes, lesions or ulcers Psychiatry: Judgement and insight appear normal. Mood & affect appropriate.     Data Reviewed: I have personally reviewed following labs and imaging studies  CBC: Recent Labs  Lab 05/01/17 0004 05/01/17 0006  WBC 9.6  --   NEUTROABS 7.1  --   HGB 12.7* 12.6*  HCT 37.2* 37.0*  MCV 97.6  --   PLT 218  --    Basic Metabolic Panel: Recent Labs  Lab 05/01/17 0006 05/01/17 0316  NA 143 141  K 3.9 3.4*  CL 109 112*  CO2  --  20*  GLUCOSE 167* 116*  BUN 60* 65*  CREATININE 3.40* 2.84*  CALCIUM  --  8.5*   GFR: CrCl cannot be calculated (Unknown ideal weight.). Liver Function Tests: Recent Labs  Lab 05/01/17 0316  AST 22  ALT 23  ALKPHOS 32*  BILITOT 0.5  PROT 6.0*  ALBUMIN 3.3*   No results for input(s): LIPASE, AMYLASE in the last 168 hours. No results for input(s): AMMONIA in the last 168 hours. Coagulation Profile: Recent Labs  Lab 05/01/17 0157  INR 1.18   Cardiac Enzymes: Recent Labs  Lab 05/01/17 0316  TROPONINI 0.04*   BNP (last 3 results) No results for input(s): PROBNP in the last 8760 hours. HbA1C: No results for input(s): HGBA1C in the last 72 hours. CBG: No results for input(s): GLUCAP in the last 168 hours. Lipid Profile: Recent Labs    05/01/17 0316  CHOL 224*  HDL 28*  LDLCALC 155*  TRIG 207*  CHOLHDL 8.0   Thyroid Function Tests: No results for input(s): TSH, T4TOTAL, FREET4, T3FREE, THYROIDAB in the last 72 hours. Anemia Panel: No results for input(s): VITAMINB12, FOLATE, FERRITIN, TIBC, IRON, RETICCTPCT in the last 72 hours. Sepsis Labs: No results for input(s): PROCALCITON, LATICACIDVEN in the last 168 hours.  No results found for this or any previous visit (from the past 240 hour(s)).       Radiology Studies: Dg Chest 2 View  Result Date: 05/01/2017 CLINICAL DATA:  Acute onset of left-sided chest pain, radiating to the jaw.  EXAM: CHEST - 2 VIEW COMPARISON:  None. FINDINGS: The lungs are well-aerated and clear. There is no evidence of focal opacification, pleural effusion or pneumothorax. The heart is borderline enlarged. The patient is status post median sternotomy. No acute osseous abnormalities are seen. Chronic left-sided rib deformities are noted. IMPRESSION: Borderline cardiomegaly.  Lungs remain grossly clear. Electronically Signed   By: Garald Balding M.D.   On: 05/01/2017 01:04   Dg Chest Port 1 View  Result Date: 05/01/2017 CLINICAL DATA:  Acute onset of left central chest pain. EXAM: PORTABLE CHEST 1 VIEW COMPARISON:  Chest radiograph performed earlier today at 12:23 a.m. FINDINGS: The lungs are well-aerated. Mild bibasilar atelectasis is noted. There is no evidence of pleural effusion or pneumothorax. The cardiomediastinal silhouette is borderline normal in size. The patient is status post median sternotomy. No acute osseous abnormalities are seen. Chronic left-sided rib deformities are noted. IMPRESSION: Mild bibasilar atelectasis.  Lungs otherwise clear. Electronically Signed   By: Garald Balding M.D.   On: 05/01/2017 05:27        Scheduled Meds: . allopurinol  100 mg Oral Daily  . amLODipine  5 mg Oral Daily  . aspirin EC  81 mg Oral QHS  . brimonidine  1 drop Both Eyes QODAY  . clopidogrel  75 mg Oral Daily  . doxazosin  4 mg Oral Daily  . fenofibrate  160 mg Oral Daily  . FLUoxetine  20 mg Oral Daily  . hydrALAZINE  50 mg Oral BID  . Melatonin  3 mg Oral QHS  . metoprolol tartrate  50 mg Oral BID   Continuous Infusions: . sodium chloride    . heparin 1,200 Units/hr (05/01/17 0309)  . nitroGLYCERIN       LOS: 0 days    Time spent: over 30 min    Fayrene Helper, MD [facility] Pager [phone]   If 7PM-7AM, please contact night-coverage [email or web address] Password [facility] 05/01/2017, 7:48 AM

## 2017-05-01 NOTE — Progress Notes (Signed)
Upon arrival to unit pt expressing difficulty going to restroom.  Dr. Rhae Hammock at bedside with orders to place foley catheter.  49 French foley catheter placed without difficulty.  350 ml out immediately upon placement.  [place] notified of axillary temp of 101.7.  Orders received.

## 2017-05-01 NOTE — ED Notes (Signed)
Pt. To XRAY via stretcher. 

## 2017-05-01 NOTE — Progress Notes (Addendum)
Progress Note  Patient Name: Nathan Wolfe Date of Encounter: 05/01/2017  Primary Cardiologist: No primary care provider on file.  Subjective   No chest pain this morning, remains on IV nitro drip.   Inpatient Medications    Scheduled Meds: . allopurinol  100 mg Oral Daily  . amLODipine  5 mg Oral Daily  . aspirin EC  81 mg Oral QHS  . brimonidine  1 drop Both Eyes QODAY  . clopidogrel  75 mg Oral Daily  . doxazosin  4 mg Oral Daily  . fenofibrate  160 mg Oral Daily  . FLUoxetine  20 mg Oral Daily  . hydrALAZINE  50 mg Oral BID  . Melatonin  3 mg Oral QHS  . metoprolol tartrate  50 mg Oral BID  . potassium chloride  20 mEq Oral Once   Continuous Infusions: . sodium chloride    . heparin 1,200 Units/hr (05/01/17 0309)  . nitroGLYCERIN 5 mcg/min (05/01/17 0821)   PRN Meds: acetaminophen, diphenhydrAMINE, gi cocktail, hydrALAZINE, morphine injection, ondansetron (ZOFRAN) IV   Vital Signs    Vitals:   05/01/17 0730 05/01/17 0800 05/01/17 0830 05/01/17 0831  BP: (!) 174/83 (!) 180/86 (!) 179/74   Pulse: 64 66 66 61  Resp: (!) 25 (!) 21 (!) 23 (!) 24  Temp:      TempSrc:      SpO2: 96% 96%  99%    Intake/Output Summary (Last 24 hours) at 05/01/2017 1038 Last data filed at 05/01/2017 0344 Gross per 24 hour  Intake -  Output 450 ml  Net -450 ml   There were no vitals filed for this visit.  Telemetry    SR - Personally Reviewed  ECG    SR with ST depression in lateral leads (no old to compare) - Personally Reviewed  Physical Exam   General: Well developed, well nourished, obese older W male appearing in no acute distress. Head: Normocephalic, atraumatic.  Neck: Supple, no JVD. Lungs:  Resp regular and unlabored, CTA. Heart: RRR, S1, S2, no S3, S4, or murmur; no rub. Abdomen: Soft, non-tender, non-distended with normoactive bowel sounds.  Extremities: No clubbing, cyanosis, edema. Distal pedal pulses are 2+ bilaterally. Neuro: Alert and oriented X 3.  Moves all extremities spontaneously. Psych: Normal affect.  Labs    Chemistry Recent Labs  Lab 05/01/17 0006 05/01/17 0316  NA 143 141  K 3.9 3.4*  CL 109 112*  CO2  --  20*  GLUCOSE 167* 116*  BUN 60* 65*  CREATININE 3.40* 2.84*  CALCIUM  --  8.5*  PROT  --  6.0*  ALBUMIN  --  3.3*  AST  --  22  ALT  --  23  ALKPHOS  --  32*  BILITOT  --  0.5  GFRNONAA  --  21*  GFRAA  --  24*  ANIONGAP  --  9     Hematology Recent Labs  Lab 05/01/17 0004 05/01/17 0006  WBC 9.6  --   RBC 3.81*  --   HGB 12.7* 12.6*  HCT 37.2* 37.0*  MCV 97.6  --   MCH 33.3  --   MCHC 34.1  --   RDW 13.8  --   PLT 218  --     Cardiac Enzymes Recent Labs  Lab 05/01/17 0316 05/01/17 0803  TROPONINI 0.04* 1.57*    Recent Labs  Lab 05/01/17 0004  TROPIPOC 0.00     BNP Recent Labs  Lab 05/01/17 0316  BNP 366.5*  DDimer No results for input(s): DDIMER in the last 168 hours.    Radiology    Dg Chest 2 View  Result Date: 05/01/2017 CLINICAL DATA:  Acute onset of left-sided chest pain, radiating to the jaw. EXAM: CHEST - 2 VIEW COMPARISON:  None. FINDINGS: The lungs are well-aerated and clear. There is no evidence of focal opacification, pleural effusion or pneumothorax. The heart is borderline enlarged. The patient is status post median sternotomy. No acute osseous abnormalities are seen. Chronic left-sided rib deformities are noted. IMPRESSION: Borderline cardiomegaly.  Lungs remain grossly clear. Electronically Signed   By: Garald Balding M.D.   On: 05/01/2017 01:04   Dg Chest Port 1 View  Result Date: 05/01/2017 CLINICAL DATA:  Acute onset of left central chest pain. EXAM: PORTABLE CHEST 1 VIEW COMPARISON:  Chest radiograph performed earlier today at 12:23 a.m. FINDINGS: The lungs are well-aerated. Mild bibasilar atelectasis is noted. There is no evidence of pleural effusion or pneumothorax. The cardiomediastinal silhouette is borderline normal in size. The patient is status  post median sternotomy. No acute osseous abnormalities are seen. Chronic left-sided rib deformities are noted. IMPRESSION: Mild bibasilar atelectasis.  Lungs otherwise clear. Electronically Signed   By: Garald Balding M.D.   On: 05/01/2017 05:27    Cardiac Studies   N/a   Patient Profile     72 y.o. male with PMH of CAD s/p 3v CABG ('00), then redo bypass 2011, with multiple PCIs, CKD stage IV who presented with unstable angina and positive troponin.   Assessment & Plan    1. Unstable Angina/NSTEMI: he has a very complicated cardiac hx with 3v CABG in 2000 then redo in 2011. Reports his last cath was about 3 years ago and had a stent placed at that time. States he was told only one of his grafts was working at that time. His Cardiologist Dr. Jori Moll stated he was not going to do any more testing on him, and told he would have to have another bypass. He had onset of chest pain last night around 10pm with 3L nitro taken without relief. Called EMS given ASA. Did finally have relief with IV nitro. Now currently pain free. Situation further complicated by his CKD stage IV. Trop 0.04>>1.57.  -- check echo -- continue to cycle troponins -- will need to get records given his complex hx. Hesitant to proceed with invasive procedure given his renal function.  -- continue IV nitro, and heparin for now as he is pain free  2. HTN: Blood pressure is uncontrolled. States he has had significant issues with this in past.  -- further increase amlodipine, and hydralazine--> consider increasing BB if HR remains stable.   3. HL: has not been on statin 2/2 myalgia in the past. LDL 170. Suspect he would be a good candidate for PCSK9s as an outpatient. -- add Zetia  4. CKD stage IV: Cr 3.40>>2.84 today. Unclear baseline. Hopefully able to determine from records.  -- follow BMET  5. Hypokalemia: 3.4 this morning. Repleted per primary   Signed, Reino Bellis, NP  05/01/2017, 10:38 AM  Pager # [phone]   For  questions or updates, please contact La[facility] Please consult [email or web address] for contact info under Cardiology/STEMI.   Agree with note by Reino Bellis NP-C  Nathan Wolfe was admitted last night with unstable angina/acute coronary syndrome/non-STEMI. 3 pieces of pizza. He is visiting his family from [place]. He has a complex past medical history remarkable for remote bypass surgery and subsequent redo  surgery with intervention since that time. He has chronic kidney disease stage IV with a creatinine in the mid 3 range. He had also chest pain requiring forcep and glycerin with improvement in symptoms and a return of chest pain. His troponins have risen to 1.57.His EKG shows lateral ST segment depression. A long discussion regarding cardiac catheterization and potential radiocontrast nephropathy. I have actually spoken to his cardiologist in [place], Dr. Darcey Nora [phone]) and agreed with performing cardiac catheterization to define his anatomy given his current clinical situation. I have discussed the risk of acute renal failure despite using minimal contrast. We will admit him, cycle his enzymes, continue IV heparin and nitroglycerin and hydrate him. We'll follow his renal function closely arranged for him to undergo cardiac catheterization on Monday.   Last catheterization was March 2016. His EF is normal. He has a patent LIMA to the LAD with an occluded LAD beyond LIMA insertion. He has a patent vein graft to the right status post stenting of the PDA with a 2.5 mm Synergy drug-eluting stent to the vein graft and a diseased ramus branch was with not intervened on.   Lorretta Harp, M.D., [place], [facility], [place] [place] [address]. [place], [place]  [id number]  [phone] 05/01/2017 11:40 AM

## 2017-05-01 NOTE — Consult Note (Signed)
Cardiology Consultation Note    Patient ID: Keldan Eplin, MRN: [id number], DOB/AGE: 1945/10/18 72 y.o. Admit date: 04/30/2017   Date of Consult: 05/01/2017 Primary Physician: No primary care provider on file.  Chief Complaint: Chest pain  Requesting MD: Dr. Randal Buba  HPI: Nathan Wolfe is a 72 y.o. male with a history of complex coronary artery disease, status post three-vessel bypass in 2000 and a redo bypass surgery in 2011, multiple PCIs, CKD stage IV, who presents with chest pain at rest this evening.  The patient lives in [place] with his wife, but is here visiting his daughter.  Over the last 1-2 nights, he developed a cough and some associated sputum production that he was unable to expectorate fully.  He went to an urgent care earlier today and was prescribed a Z-Pak; a chest x-ray was done and showed no evidence of an ill infiltrate.  Otherwise, he was doing well until this evening following dinner, when he was sitting down watching TV and noticed the onset of substernal chest pressure with mild radiation to the jaw and down the left arm.  Over the course of the next hour or so the patient took 4 nitroglycerin with improvement but not complete resolution of the pain following the first 3 doses.  After he took the fourth dose, his pain resolved.  However, given the stuttering symptoms he presented to the [facility] ED for further evaluation.  In the ED, his initial vital signs were notable for significant hypertension but were otherwise within normal limits.  Initial ED ECG showed 1-2 mm horizontal ST depressions in V4 through V6 (no baseline for comparison).  Labs are notable for creatinine of 3.4, and normal initial troponin.  Chest x-ray showed no evidence of infiltrate or other acute abnormalities.  The patient had been chest pain-free throughout most of his stay in the ED, however following return from x-ray he did notice some chest discomfort with movement that is  persistent upon my interview at about 1 out of 10.  IV nitroglycerin drip was ordered but not yet started at the time I saw the patient.  History reviewed. No pertinent past medical history.    Surgical History: History reviewed. No pertinent surgical history.   Home Meds: Prior to Admission medications   Not on File  Pertinent medications reviewed and include furosemide (uncertain dose), Plavix 75 mg daily, aspirin 81 mg daily, isosorbide mononitrate 120 mg daily.  Full medication list to follow-up on pharmacy review.  Inpatient Medications:   . nitroGLYCERIN      Allergies: Not on File  Social History   Socioeconomic History  . Marital status: Not on file    Spouse name: Not on file  . Number of children: Not on file  . Years of education: Not on file  . Highest education level: Not on file  Social Needs  . Financial resource strain: Not on file  . Food insecurity - worry: Not on file  . Food insecurity - inability: Not on file  . Transportation needs - medical: Not on file  . Transportation needs - non-medical: Not on file  Occupational History  . Not on file  Tobacco Use  . Smoking status: Not on file  Substance and Sexual Activity  . Alcohol use: Not on file  . Drug use: Not on file  . Sexual activity: Not on file  Other Topics Concern  . Not on file  Social History Narrative  . Not on file  No family history on file.   Review of Systems: All other systems reviewed and are otherwise negative except as noted above.  Labs: No results for input(s): CKTOTAL, CKMB, TROPONINI in the last 72 hours. Lab Results  Component Value Date   WBC 9.6 05/01/2017   HGB 12.6 (L) 05/01/2017   HCT 37.0 (L) 05/01/2017   MCV 97.6 05/01/2017   PLT 218 05/01/2017    Recent Labs  Lab 05/01/17 0006  NA 143  K 3.9  CL 109  BUN 60*  CREATININE 3.40*  GLUCOSE 167*   No results found for: CHOL, HDL, LDLCALC, TRIG No results found for: DDIMER  Radiology/Studies:  Dg  Chest 2 View  Result Date: 05/01/2017 CLINICAL DATA:  Acute onset of left-sided chest pain, radiating to the jaw. EXAM: CHEST - 2 VIEW COMPARISON:  None. FINDINGS: The lungs are well-aerated and clear. There is no evidence of focal opacification, pleural effusion or pneumothorax. The heart is borderline enlarged. The patient is status post median sternotomy. No acute osseous abnormalities are seen. Chronic left-sided rib deformities are noted. IMPRESSION: Borderline cardiomegaly.  Lungs remain grossly clear. Electronically Signed   By: Garald Balding M.D.   On: 05/01/2017 01:04    Wt Readings from Last 3 Encounters:  No data found for Wt    EKG: Normal sinus rhythm, horizontal 1-2 mm ST depressions in V4 through V6.  Physical Exam: Blood pressure (!) 147/83, pulse 69, temperature 99.2 F (37.3 C), temperature source Oral, resp. rate (!) 26, SpO2 96 %. There is no height or weight on file to calculate BMI. General: Well developed, well nourished, in no acute distress. Head: Normocephalic, atraumatic, sclera non-icteric, no xanthomas, nares are without discharge.  Neck: JVD not elevated. Lungs: Clear bilaterally to auscultation without wheezes, rales, or rhonchi. Breathing is unlabored. Heart: RRR with S1 S2. No murmurs, rubs, or gallops appreciated. Abdomen: Soft, non-tender, non-distended with normoactive bowel sounds. No hepatomegaly. No rebound/guarding. No obvious abdominal masses. Msk:  Strength and tone appear normal for age. Extremities: No clubbing or cyanosis. Trace edema.  Distal pedal pulses are 2+ and equal bilaterally. Neuro: Alert and oriented X 3. No facial asymmetry. No focal deficit. Moves all extremities spontaneously. Psych:  Responds to questions appropriately with a normal affect.     Assessment and Plan  72 year old male with a complex coronary history, and his anatomy is not known at present as all of his prior care was at an outside hospital in [place].  He  presents this evening with symptoms concerning for unstable angina.  His initial troponin was within normal limits, however his ECG shows ischemic changes although there is no baseline for comparison.  He continues to endorse mild chest discomfort upon my interview.  Given his advanced CKD and complex anatomy, it would be important to obtain his most recent cath films prior to proceeding with coronary angiography.  For now, would recommend IV heparin drip and IV nitroglycerin drip for management of presumed unstable coronary syndrome.  Plan to continue home aspirin and Plavix, and hold home isosorbide while he is on a nitroglycerin infusion.  Given his advanced CKD, would recommend hydration with gentle IV fluids and holding his furosemide for now.  Would plan to keep him n.p.o. in anticipation of possible cath, although it will be vital to obtain prior cath films before proceeding, especially if his biomarkers remain negative and his pain is well controlled.  If he has chest pain refractory to medical management and rising  biomarkers, would plan to proceed more urgently to the Cath Lab.  Cardiology will continue to follow.  Signed, Doylene Canning, MD 05/01/2017, 1:13 AM

## 2017-05-01 NOTE — Progress Notes (Signed)
ANTICOAGULATION CONSULT NOTE  Pharmacy Consult:  Heparin  Indication: chest pain/ACS  Allergies  Allergen Reactions  . Statins Other (See Comments)    Full body muscle stuffiness/pain    Patient Measurements: Weight = 118.8 kg Height = 67 inches  Vital Signs: BP: 180/85 (03/15 1602) Pulse Rate: 73 (03/15 1630)  Labs: Recent Labs    05/01/17 0004 05/01/17 0006 05/01/17 0157 05/01/17 0316 05/01/17 0803 05/01/17 1055  HGB 12.7* 12.6*  --   --   --   --   HCT 37.2* 37.0*  --   --   --   --   PLT 218  --   --   --   --   --   LABPROT  --   --  14.9  --   --   --   INR  --   --  1.18  --   --   --   HEPARINUNFRC  --   --   --   --   --  0.19*  CREATININE  --  3.40*  --  2.84*  --   --   TROPONINI  --   --   --  0.04* 1.57*  --     Assessment: 71 YOM continues on IV heparin for ACS.  Heparin level was sub-therapeutic; no bleeding reported.   Goal of Therapy:  Heparin level 0.3-0.7 units/ml Monitor platelets by anticoagulation protocol: Yes    Plan:  Rebolus with heparin 2500 units, then Increase heparin gtt to 1550 units/hr Check 8 hr heparin level Daily heparin level and CBC   Alleigh Mollica D. Mina Marble, PharmD, BCPS Pager:  [phone] 05/01/2017, 5:35 PM

## 2017-05-01 NOTE — Progress Notes (Signed)
Patient pain free. Fell asleep.

## 2017-05-01 NOTE — Progress Notes (Signed)
ANTICOAGULATION CONSULT NOTE - Initial Consult  Pharmacy Consult for Heparin  Indication: chest pain/ACS  Allergies  Allergen Reactions  . Statins Other (See Comments)    Full body muscle stuffiness/pain    Patient Measurements: ~120 kg (per pt)  Vital Signs: Temp: 99.2 F (37.3 C) (03/14 2359) Temp Source: Oral (03/14 2359) BP: 162/56 (03/15 0124) Pulse Rate: 67 (03/15 0124)  Labs: Recent Labs    05/01/17 0004 05/01/17 0006  HGB 12.7* 12.6*  HCT 37.2* 37.0*  PLT 218  --   CREATININE  --  3.40*   Assessment: 72 y/o M with chest pain to start heparin, Hgb 12.6, plts OK, noted renal dysfunction, PTA meds reviewed.   Goal of Therapy:  Heparin level 0.3-0.7 units/ml Monitor platelets by anticoagulation protocol: Yes   Plan:  Heparin 4000 units BOLUS Start heparin drip at 1200 units/hr 1100 HL Daily CBC/HL Monitor for bleeding   Narda Bonds 05/01/2017,2:23 AM

## 2017-05-01 NOTE — Progress Notes (Signed)
Patient has home CPAP unit at bedside on home settings. Water chamber filled with sterile water. Patient tolerating well at this time.

## 2017-05-01 NOTE — ED Notes (Signed)
Cardiologist at bedside.  

## 2017-05-01 NOTE — Progress Notes (Signed)
Seen the patient, had 1 hour of chest pain during transportation to [place]. EKG obtained on 5:42 PM showed ST depression in lateral leads. By the time I saw the patient, he was chest pain free. 0/10 chest pain. Advised to increase IV nitro. Repeat EKG showed improving ST depression in lateral leads. Reviewed with Dr. Radford Pax as well. As long as he is chest pain free, would delay cath given stage IV CKD and risk of contrast induced nephropathy. Continue uptitration of IV nitro. Continue BB and IV heparin  Discussed with Hospitalist group, plan to transfer the patient to cardiac ICU. Low threshold for urgent cath.    Note, hospitalist prefer cardiology service to take over as primary service, will defer the decision to rounding cardiologist in AM.   Signed, Almyra Deforest PA Pager: [phone]

## 2017-05-01 NOTE — ED Provider Notes (Addendum)
[place] EMERGENCY DEPARTMENT Provider Note   CSN: [id number] Arrival date & time: 04/30/17  2349     History   Chief Complaint Chief Complaint  Patient presents with  . Chest Pain    HPI Nathan Wolfe is a 72 y.o. male.  The history is provided by the patient.  Chest Pain   This is a recurrent problem. The current episode started 1 to 2 hours ago. The problem occurs constantly. The problem has been resolved (and returning). The pain is associated with rest. Pain location: left of center. The pain is at a severity of 1/10. The pain is mild. Quality: aching. The pain radiates to the left jaw. Pertinent negatives include no abdominal pain, no fever and no palpitations. He has tried nitroglycerin for the symptoms. The treatment provided significant relief. Risk factors include being elderly, male gender and obesity.  His past medical history is significant for MI.  Pertinent negatives for family medical history include: no Marfan's syndrome.  Procedure history is positive for cardiac catheterization and echocardiogram.    History reviewed. No pertinent past medical history.  There are no active problems to display for this patient.   History reviewed. No pertinent surgical history.     Home Medications    Prior to Admission medications   Not on File    Family History No family history on file.  Social History Social History   Tobacco Use  . Smoking status: Not on file  Substance Use Topics  . Alcohol use: Not on file  . Drug use: Not on file     Allergies   Patient has no allergy information on record.   Review of Systems Review of Systems  Constitutional: Negative for fever.  HENT: Negative for ear pain.   Cardiovascular: Positive for chest pain. Negative for palpitations and leg swelling.  Gastrointestinal: Negative for abdominal pain.  All other systems reviewed and are negative.    Physical Exam Updated Vital Signs BP  (!) 177/82 (BP Location: Left Arm)   Pulse 68   Temp 99.2 F (37.3 C) (Oral)   Resp 20   SpO2 96%   Physical Exam  Constitutional: He is oriented to person, place, and time. He appears well-developed and well-nourished. No distress.  HENT:  Head: Normocephalic and atraumatic.  Mouth/Throat: No oropharyngeal exudate.  Eyes: Conjunctivae are normal. Pupils are equal, round, and reactive to light.  Neck: Normal range of motion.  Cardiovascular: Normal rate, regular rhythm, normal heart sounds and intact distal pulses.  Pulmonary/Chest: Effort normal and breath sounds normal. No stridor. He has no wheezes. He has no rales.  Abdominal: Soft. Bowel sounds are normal. He exhibits no mass. There is no tenderness. There is no rebound and no guarding.  Musculoskeletal: Normal range of motion. He exhibits no deformity.  Lymphadenopathy:    He has no cervical adenopathy.  Neurological: He is alert and oriented to person, place, and time. He displays normal reflexes.  Skin: Skin is warm. Capillary refill takes less than 2 seconds.     ED Treatments / Results  Labs (all labs ordered are listed, but only abnormal results are displayed)  Results for orders placed or performed during the hospital encounter of 04/30/17  CBC with Differential/Platelet  Result Value Ref Range   WBC 9.6 4.0 - 10.5 K/uL   RBC 3.81 (L) 4.22 - 5.81 MIL/uL   Hemoglobin 12.7 (L) 13.0 - 17.0 g/dL   HCT 37.2 (L) 39.0 - 52.0 %  MCV 97.6 78.0 - 100.0 fL   MCH 33.3 26.0 - 34.0 pg   MCHC 34.1 30.0 - 36.0 g/dL   RDW 13.8 11.5 - 15.5 %   Platelets 218 150 - 400 K/uL   Neutrophils Relative % 74 %   Neutro Abs 7.1 1.7 - 7.7 K/uL   Lymphocytes Relative 10 %   Lymphs Abs 1.0 0.7 - 4.0 K/uL   Monocytes Relative 10 %   Monocytes Absolute 0.9 0.1 - 1.0 K/uL   Eosinophils Relative 6 %   Eosinophils Absolute 0.5 0.0 - 0.7 K/uL   Basophils Relative 0 %   Basophils Absolute 0.0 0.0 - 0.1 K/uL  I-Stat Chem 8, ED  Result Value  Ref Range   Sodium 143 135 - 145 mmol/L   Potassium 3.9 3.5 - 5.1 mmol/L   Chloride 109 101 - 111 mmol/L   BUN 60 (H) 6 - 20 mg/dL   Creatinine, Ser 3.40 (H) 0.61 - 1.24 mg/dL   Glucose, Bld 167 (H) 65 - 99 mg/dL   Calcium, Ion 1.17 1.15 - 1.40 mmol/L   TCO2 22 22 - 32 mmol/L   Hemoglobin 12.6 (L) 13.0 - 17.0 g/dL   HCT 37.0 (L) 39.0 - 52.0 %  I-stat troponin, ED  Result Value Ref Range   Troponin i, poc 0.00 0.00 - 0.08 ng/mL   Comment 3           No results found.  EKG  EKG Interpretation  Date/Time:  Thursday April 30 2017 23:54:03 EDT Ventricular Rate:  70 PR Interval:  166 QRS Duration: 96 QT Interval:  444 QTC Calculation: 479 R Axis:   29 Text Interpretation:  Normal sinus rhythm ST depression, consider subendocardial injury Nonspecific T wave abnormality Confirmed by Randal Buba, Joetta Delprado ([id number]) on 04/30/2017 11:57:32 PM       Radiology No results found.  Procedures Procedures (including critical care time)  Medications Ordered in ED Medications  nitroGLYCERIN 50 mg in dextrose 5 % 250 mL (0.2 mg/mL) infusion (not administered)     Final Clinical Impressions(s) / ED Diagnoses    Given patient's heart score will require inpatient admission  Seen by cardiology in the ED    Kaeleigh Westendorf, MD 05/01/17 9326

## 2017-05-02 DIAGNOSIS — N184 Chronic kidney disease, stage 4 (severe): Secondary | ICD-10-CM

## 2017-05-02 DIAGNOSIS — Z9989 Dependence on other enabling machines and devices: Secondary | ICD-10-CM

## 2017-05-02 DIAGNOSIS — G4733 Obstructive sleep apnea (adult) (pediatric): Secondary | ICD-10-CM

## 2017-05-02 LAB — MAGNESIUM: Magnesium: 1.9 mg/dL (ref 1.7–2.4)

## 2017-05-02 LAB — BASIC METABOLIC PANEL
Anion gap: 13 (ref 5–15)
BUN: 54 mg/dL — AB (ref 6–20)
CHLORIDE: 108 mmol/L (ref 101–111)
CO2: 17 mmol/L — AB (ref 22–32)
CREATININE: 2.97 mg/dL — AB (ref 0.61–1.24)
Calcium: 9.2 mg/dL (ref 8.9–10.3)
GFR calc Af Amer: 23 mL/min — ABNORMAL LOW (ref >60)
GFR calc non Af Amer: 19 mL/min — ABNORMAL LOW (ref >60)
Glucose, Bld: 128 mg/dL — ABNORMAL HIGH (ref 65–99)
POTASSIUM: 4.2 mmol/L (ref 3.5–5.1)
Sodium: 138 mmol/L (ref 135–145)

## 2017-05-02 LAB — CBC
HCT: 35 % — ABNORMAL LOW (ref 39.0–52.0)
HEMOGLOBIN: 11.6 g/dL — AB (ref 13.0–17.0)
MCH: 32.8 pg (ref 26.0–34.0)
MCHC: 33.1 g/dL (ref 30.0–36.0)
MCV: 98.9 fL (ref 78.0–100.0)
Platelets: 172 10*3/uL (ref 150–400)
RBC: 3.54 MIL/uL — AB (ref 4.22–5.81)
RDW: 14.4 % (ref 11.5–15.5)
WBC: 9 10*3/uL (ref 4.0–10.5)

## 2017-05-02 LAB — TROPONIN I
Troponin I: 2.61 ng/mL (ref <0.03)
Troponin I: 3.21 ng/mL (ref <0.03)
Troponin I: 2.8 ng/mL (ref <0.03)
Troponin I: 2.63 ng/mL (ref <0.03)

## 2017-05-02 LAB — INFLUENZA PANEL BY PCR (TYPE A & B)
INFLBPCR: NEGATIVE
Influenza A By PCR: POSITIVE — AB

## 2017-05-02 LAB — GLUCOSE, CAPILLARY: Glucose-Capillary: 133 mg/dL — ABNORMAL HIGH (ref 65–99)

## 2017-05-02 LAB — MRSA PCR SCREENING: MRSA BY PCR: NEGATIVE

## 2017-05-02 LAB — HEPARIN LEVEL (UNFRACTIONATED)
Heparin Unfractionated: 0.31 IU/mL (ref 0.30–0.70)
Heparin Unfractionated: 0.24 IU/mL — ABNORMAL LOW (ref 0.30–0.70)

## 2017-05-02 MED ORDER — WHITE PETROLATUM EX OINT
TOPICAL_OINTMENT | CUTANEOUS | Status: AC
  Administered 2017-05-02: 10:00:00
  Filled 2017-05-02: qty 28.35

## 2017-05-02 MED ORDER — OSELTAMIVIR PHOSPHATE 30 MG PO CAPS
30.0000 mg | ORAL_CAPSULE | Freq: Every day | ORAL | Status: AC
  Administered 2017-05-02 – 2017-05-06 (×5): 30 mg via ORAL
  Filled 2017-05-02 (×5): qty 1

## 2017-05-02 MED ORDER — BENZONATATE 100 MG PO CAPS
100.0000 mg | ORAL_CAPSULE | Freq: Three times a day (TID) | ORAL | Status: DC | PRN
  Administered 2017-05-02 – 2017-05-08 (×5): 100 mg via ORAL
  Filled 2017-05-02 (×5): qty 1

## 2017-05-02 MED ORDER — IPRATROPIUM-ALBUTEROL 0.5-2.5 (3) MG/3ML IN SOLN
3.0000 mL | RESPIRATORY_TRACT | Status: DC | PRN
  Administered 2017-05-02 – 2017-05-03 (×4): 3 mL via RESPIRATORY_TRACT
  Filled 2017-05-02 (×4): qty 3

## 2017-05-02 MED ORDER — SODIUM CHLORIDE 0.9 % IV SOLN
INTRAVENOUS | Status: DC
  Administered 2017-05-02 – 2017-05-03 (×3): via INTRAVENOUS

## 2017-05-02 MED ORDER — ORAL CARE MOUTH RINSE
15.0000 mL | Freq: Two times a day (BID) | OROMUCOSAL | Status: DC
  Administered 2017-05-02 – 2017-05-18 (×16): 15 mL via OROMUCOSAL

## 2017-05-02 MED ORDER — CHLORHEXIDINE GLUCONATE 0.12 % MT SOLN
15.0000 mL | Freq: Two times a day (BID) | OROMUCOSAL | Status: DC
  Administered 2017-05-02 – 2017-05-16 (×24): 15 mL via OROMUCOSAL
  Filled 2017-05-02 (×23): qty 15

## 2017-05-02 MED ORDER — DOCUSATE SODIUM 100 MG PO CAPS
100.0000 mg | ORAL_CAPSULE | Freq: Every day | ORAL | Status: DC
  Administered 2017-05-02 – 2017-05-07 (×6): 100 mg via ORAL
  Filled 2017-05-02 (×6): qty 1

## 2017-05-02 NOTE — Progress Notes (Signed)
ANTICOAGULATION CONSULT NOTE  Pharmacy Consult: Heparin  Indication: chest pain/ACS  Allergies  Allergen Reactions  . Statins Other (See Comments)    Full body muscle stuffiness/pain    Patient Measurements: Weight = 118.8 kg Height = 67 inches  Vital Signs: Temp: 100.5 F (38.1 C) (03/16 1207) Temp Source: Oral (03/16 1207) BP: 91/36 (03/16 1207)  Labs: Recent Labs    05/01/17 0004 05/01/17 0006 05/01/17 0157 05/01/17 0316  05/01/17 1055  05/02/17 0106 05/02/17 0236 05/02/17 4656 05/02/17 1102  HGB 12.7* 12.6*  --   --   --   --   --   --   --  11.6*  --   HCT 37.2* 37.0*  --   --   --   --   --   --   --  35.0*  --   PLT 218  --   --   --   --   --   --   --   --  172  --   LABPROT  --   --  14.9  --   --   --   --   --   --   --   --   INR  --   --  1.18  --   --   --   --   --   --   --   --   HEPARINUNFRC  --   --   --   --   --  0.19*  --   --  0.31  --  0.24*  CREATININE  --  3.40*  --  2.84*  --   --   --   --   --  2.97*  --   TROPONINI  --   --   --  0.04*   < >  --    < > 2.61*  --  3.21* 2.80*   < > = values in this interval not displayed.    Assessment: 4 YOM continues on IV heparin for ACS.   Heparin level low this morning at 0.24, no issues noted, hgb down to 11.6. Will increase rate and recheck in am.  Goal of Therapy:  Heparin level 0.3-0.7 units/ml Monitor platelets by anticoagulation protocol: Yes   Plan:  Increase heparin to 1750 units/hr Daily heparin level, cbc  Erin Hearing PharmD., BCPS Clinical Pharmacist 05/02/2017 1:12 PM

## 2017-05-02 NOTE — Progress Notes (Signed)
Progress Note  Patient Name: Nathan Wolfe Date of Encounter: 05/02/2017  Primary Cardiologist: No primary care provider on file.   Subjective   Feeling OK.  No chest pain since transferring units yesterday.  Continues to cough heavily.   Inpatient Medications    Scheduled Meds: . allopurinol  100 mg Oral Daily  . amLODipine  10 mg Oral Daily  . aspirin EC  81 mg Oral QHS  . brimonidine  1 drop Both Eyes QODAY  . chlorhexidine  15 mL Mouth Rinse BID  . clopidogrel  75 mg Oral Daily  . doxazosin  4 mg Oral Daily  . ezetimibe  10 mg Oral Daily  . fenofibrate  160 mg Oral Daily  . FLUoxetine  20 mg Oral Daily  . hydrALAZINE  75 mg Oral BID  . mouth rinse  15 mL Mouth Rinse q12n4p  . Melatonin  3 mg Oral QHS  . metoprolol tartrate  50 mg Oral BID  . oseltamivir  30 mg Oral Daily   Continuous Infusions: . azithromycin Stopped (05/02/17 0339)  . cefTRIAXone (ROCEPHIN)  IV Stopped (05/02/17 0504)  . heparin 1,550 Units/hr (05/02/17 0500)  . nitroGLYCERIN 10 mcg/min (05/02/17 0715)   PRN Meds: acetaminophen, diphenhydrAMINE, gi cocktail, hydrALAZINE, ipratropium-albuterol, morphine injection, ondansetron (ZOFRAN) IV   Vital Signs    Vitals:   05/02/17 0600 05/02/17 0700 05/02/17 0807 05/02/17 0919  BP: (!) 109/39 (!) 124/39 (!) 113/53   Pulse:      Resp: (!) 22 20 17    Temp:   (!) 100.7 F (38.2 C)   TempSrc:   Oral   SpO2:   100% 92%  Weight:      Height:        Intake/Output Summary (Last 24 hours) at 05/02/2017 0958 Last data filed at 05/02/2017 0800 Gross per 24 hour  Intake 1255.73 ml  Output 2375 ml  Net -1119.27 ml   Filed Weights   05/01/17 1700 05/02/17 0231  Weight: 262 lb (118.8 kg) 261 lb 14.5 oz (118.8 kg)    Telemetry    Sinus rhythm.  Up to 3 beats of NSVT.  PVCs.- Personally Reviewed  ECG    Sinus rhythm.  Rate 67 bpm.  Lateral ST depression.- Personally Reviewed  Physical Exam   VS:  BP (!) 113/53 (BP Location: Right Arm)    Pulse 78   Temp (!) 100.7 F (38.2 C) (Oral) Comment: Pt had 5 blankets on him--removed  Resp 17   Ht 5\' 7"  (1.702 m)   Wt 261 lb 14.5 oz (118.8 kg)   SpO2 92%   BMI 41.02 kg/m   , BMI Body mass index is 41.02 kg/m. GENERAL:  Ill-appearing.  Warm to touch.  Frequent cough HEENT: Pupils equal round and reactive, fundi not visualized, oral mucosa unremarkable NECK:  No jugular venous distention, waveform within normal limits, carotid upstroke brisk and symmetric, no bruits LUNGS: Scattered rhonchi bilaterally HEART:  RRR.  PMI not displaced or sustained,S1 and S2 within normal limits, no S3, no S4, no clicks, no rubs, no murmurs ABD:  Flat, positive bowel sounds normal in frequency in pitch, no bruits, no rebound, no guarding, no midline pulsatile mass, no hepatomegaly, no splenomegaly EXT:  2 plus pulses throughout, no edema, no cyanosis no clubbing SKIN:  No rashes no nodules NEURO:  Cranial nerves II through XII grossly intact, motor grossly intact throughout PSYCH:  Cognitively intact, oriented to person place and time   Labs  Chemistry Recent Labs  Lab 05/01/17 0006 05/01/17 0316  NA 143 141  K 3.9 3.4*  CL 109 112*  CO2  --  20*  GLUCOSE 167* 116*  BUN 60* 65*  CREATININE 3.40* 2.84*  CALCIUM  --  8.5*  PROT  --  6.0*  ALBUMIN  --  3.3*  AST  --  22  ALT  --  23  ALKPHOS  --  32*  BILITOT  --  0.5  GFRNONAA  --  21*  GFRAA  --  24*  ANIONGAP  --  9     Hematology Recent Labs  Lab 05/01/17 0004 05/01/17 0006 05/02/17 0642  WBC 9.6  --  9.0  RBC 3.81*  --  3.54*  HGB 12.7* 12.6* 11.6*  HCT 37.2* 37.0* 35.0*  MCV 97.6  --  98.9  MCH 33.3  --  32.8  MCHC 34.1  --  33.1  RDW 13.8  --  14.4  PLT 218  --  172    Cardiac Enzymes Recent Labs  Lab 05/01/17 0803 05/01/17 1918 05/02/17 0106 05/02/17 0642  TROPONINI 1.57* 2.62* 2.61* 3.21*    Recent Labs  Lab 05/01/17 0004  TROPIPOC 0.00     BNP Recent Labs  Lab 05/01/17 0316  BNP 366.5*      DDimer No results for input(s): DDIMER in the last 168 hours.   Radiology    Dg Chest 2 View  Result Date: 05/01/2017 CLINICAL DATA:  Acute onset of left-sided chest pain, radiating to the jaw. EXAM: CHEST - 2 VIEW COMPARISON:  None. FINDINGS: The lungs are well-aerated and clear. There is no evidence of focal opacification, pleural effusion or pneumothorax. The heart is borderline enlarged. The patient is status post median sternotomy. No acute osseous abnormalities are seen. Chronic left-sided rib deformities are noted. IMPRESSION: Borderline cardiomegaly.  Lungs remain grossly clear. Electronically Signed   By: Garald Balding M.D.   On: 05/01/2017 01:04   Dg Chest Port 1 View  Result Date: 05/01/2017 CLINICAL DATA:  Acute onset of left central chest pain. EXAM: PORTABLE CHEST 1 VIEW COMPARISON:  Chest radiograph performed earlier today at 12:23 a.m. FINDINGS: The lungs are well-aerated. Mild bibasilar atelectasis is noted. There is no evidence of pleural effusion or pneumothorax. The cardiomediastinal silhouette is borderline normal in size. The patient is status post median sternotomy. No acute osseous abnormalities are seen. Chronic left-sided rib deformities are noted. IMPRESSION: Mild bibasilar atelectasis.  Lungs otherwise clear. Electronically Signed   By: Garald Balding M.D.   On: 05/01/2017 05:27    Cardiac Studies   Echo pending  Patient Profile     72 y.o. male with CAD s/p CABG and PCI, hypertension, hyperlipidemia, carotid stenosis s/p L CEA 09/2016, CKD IV, OSA on CPAP, morbid obesity, here with NSTEMI and influenza.    Assessment & Plan    # NSTEMI: # CAD s/p CABG/PCI: # Hyperlipidemia:  Status post CABG in 2000 and redo in 2011.  Troponin up to 3.21 today.  Lateral ST depressions on EKG.  He reports chest pain with exertion.  Continue aspirin, clopidogrel, heparin, nitro, and metoprolol.  Continue fenofibrate and ezetimibe. Consider PCSK9 inhibitor at discharge.   Cath  Monday.  He is very nervous about the possible need for dialysis but willing to undergo cath.  # Influenza:  # Possible CAP: Still with low grade fever on tamiflu, ceftriazone and azithromycin  # Hypertension: Blood pressure controlled.  Continue metoprolol, amlodipine, hydralazine, and  doxazosin.  # CKD IV: Creatinine stable.  Baseline unknown.  Possible need for HD after cath as above.  For questions or updates, please contact [place] Please consult [email or web address] for contact info under Cardiology/STEMI.      Signed, Skeet Latch, MD  05/02/2017, 9:58 AM

## 2017-05-02 NOTE — Progress Notes (Signed)
Pt continues to have paroxysmal coughing fits which leave him exhausted and significantly decrease his SPO2. Dr. Marcelline Deist paged to discuss treatment options.

## 2017-05-02 NOTE — Progress Notes (Signed)
ANTICOAGULATION CONSULT NOTE  Pharmacy Consult: Heparin  Indication: chest pain/ACS  Allergies  Allergen Reactions  . Statins Other (See Comments)    Full body muscle stuffiness/pain    Patient Measurements: Weight = 118.8 kg Height = 67 inches  Vital Signs: Temp: 99.4 F (37.4 C) (03/16 0100) Temp Source: Axillary (03/16 0100) BP: 112/58 (03/16 0200) Pulse Rate: 78 (03/15 2300)  Labs: Recent Labs    05/01/17 0004 05/01/17 0006 05/01/17 0157  05/01/17 0316 05/01/17 0803 05/01/17 1055 05/01/17 1918 05/02/17 0106 05/02/17 0236  HGB 12.7* 12.6*  --   --   --   --   --   --   --   --   HCT 37.2* 37.0*  --   --   --   --   --   --   --   --   PLT 218  --   --   --   --   --   --   --   --   --   LABPROT  --   --  14.9  --   --   --   --   --   --   --   INR  --   --  1.18  --   --   --   --   --   --   --   HEPARINUNFRC  --   --   --   --   --   --  0.19*  --   --  0.31  CREATININE  --  3.40*  --   --  2.84*  --   --   --   --   --   TROPONINI  --   --   --    < > 0.04* 1.57*  --  2.62* 2.61*  --    < > = values in this interval not displayed.    Assessment: 30 YOM continues on IV heparin for ACS.  Heparin level therapeutic x 1 after rate increase  Goal of Therapy:  Heparin level 0.3-0.7 units/ml Monitor platelets by anticoagulation protocol: Yes   Plan:  Cont heparin 1550 units/hr 1000 HL  Narda Bonds, PharmD, BCPS Clinical Pharmacist Phone: [phone]

## 2017-05-02 NOTE — Progress Notes (Signed)
PROGRESS NOTE    Nathan Wolfe  VOH:[id number] DOB: [birth year] DOA: 04/30/2017 PCP: Patient, No Pcp Per   Brief Narrative:  Nathan Wolfe is Nathan Wolfe 72 y.o. 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  Patient reports symptoms did not feel like acid reflux.  Reports pain radiating into his jaw similar to previous heart attacks.  Patient reports taking approximately 4 nitros with just temporary relief of symptoms before return of pain symptoms.  En route with EMS patient was given 324 mg.   Assessment & Plan:   Active Problems:   Unstable angina (HCC)   Anemia due to chronic kidney disease   Essential hypertension   OSA on CPAP   Unstable angina  NSTEMI:   Known history of coronary artery disease.  Patient received 324 mg aspirin in route.  EKG showing signs of ischemia (ST depression in lateral leads).  Cardiology consulted and started patient on heparin drip. Last CP occurred last night when he was changing rooms.  No CP since.   EKG this AM with persistent ST depression in lateral leads (improved from yesterday when he had worsening CP) - Nitroglycerin drip (currently at 10) - Heparin drip per pharmacy - Check chest x-ray (atelectasis) - Trend cardiac troponins (trending up) - Echo pending - Continue to follow repeat EKG's prn  - Dr. Gwenlyn Found spoke to cardiologist Dr. Darcey Nora (see note regarding cath hx) - Appreciate cardiology consultative services will follow-up for further recommendations - planning for cath Monday  Influenza Payten Beaumier  Fever:  Likely reason for fever and cough.  Started on tamiflu last night as well as ceftriazone and azithromycin  for possible CAP.   Follow blood cx x 2 pending CXR with mild biasilar atelectasis    Essential hypertension - Continue metoprolol (50 BID), hydralazine (increased to 75 BID), amlodipine (increased to 10) - hold lasix and isosorbide  CKD stage IV: Patient presents with creatinine of 3.4 with BUN 60 is not exactly sure of his baseline creatinine, but notes that his GFR last was somewhere around 23.  He is followed by nephrology but does not want to be on dialysis. - gentle hydration in preparation for cath, ctm closely  Anemia of chronic disease: downtrending slightly with hydration - Continue to monitor  BPH - Continue Cardura - foley placed last night, will plan for trial of void tomorrow if possible   Dyslipidemia - Continue home medication - zetia added - consider PCSK9 as outpatient  OSA on CPAP - RT to supply CPAP at night  DVT prophylaxis: heparin gtt Code Status: full  Family Communication: daugthers at bedside Disposition Plan: pending cardiology w/u   Consultants:   cardiology  Procedures:   none  Antimicrobials:  none    Subjective: Frustrated and concerned. Nervous about potential for dialysis. Frustrated about long stay in ED and changing rooms. No CP currently, last last night when moved last. Persistent cough.   Objective: Vitals:   05/02/17 0520 05/02/17 0600 05/02/17 0700 05/02/17 0807  BP:  (!) 109/39 (!) 124/39 (!) 113/53  Pulse:      Resp:  (!) 22 20 17   Temp:    (!) 100.7 F (38.2 C)  TempSrc:  Oral  SpO2: 93%   100%  Weight:      Height:        Intake/Output Summary (Last 24 hours) at 05/02/2017 0827 Last data filed at 05/02/2017 0800 Gross per 24 hour  Intake 1255.73 ml  Output 2375 ml  Net -1119.27 ml   Filed Weights   05/01/17 1700 05/02/17 0231  Weight: 118.8 kg (262 lb) 118.8 kg (261 lb 14.5 oz)    Examination:  General: No acute distress. Cardiovascular: Heart sounds show Kylena Mole regular rate, and rhythm. No  gallops or rubs. No murmurs. No JVD. Lungs: Clear to auscultation bilaterally with good air movement. No rales, rhonchi or wheezes.  Frequent cough.  Abdomen: Soft, nontender, nondistended with normal active bowel sounds. No masses. No hepatosplenomegaly. Neurological: Alert and oriented 3. Moves all extremities 4. Cranial nerves II through XII grossly intact. Skin: Warm and dry. No rashes or lesions. Extremities: trace edema to LE, some noted to ue as well Psychiatric: Mood and affect are normal. Insight and judgment are appropriate.    Data Reviewed: I have personally reviewed following labs and imaging studies  CBC: Recent Labs  Lab 05/01/17 0004 05/01/17 0006 05/02/17 0642  WBC 9.6  --  9.0  NEUTROABS 7.1  --   --   HGB 12.7* 12.6* 11.6*  HCT 37.2* 37.0* 35.0*  MCV 97.6  --  98.9  PLT 218  --  536   Basic Metabolic Panel: Recent Labs  Lab 05/01/17 0006 05/01/17 0316 05/01/17 0803  NA 143 141  --   K 3.9 3.4*  --   CL 109 112*  --   CO2  --  20*  --   GLUCOSE 167* 116*  --   BUN 60* 65*  --   CREATININE 3.40* 2.84*  --   CALCIUM  --  8.5*  --   MG  --   --  2.1   GFR: Estimated Creatinine Clearance: 28.6 mL/min (Kerrin Markman) (by C-G formula based on SCr of 2.84 mg/dL (H)). Liver Function Tests: Recent Labs  Lab 05/01/17 0316  AST 22  ALT 23  ALKPHOS 32*  BILITOT 0.5  PROT 6.0*  ALBUMIN 3.3*   No results for input(s): LIPASE, AMYLASE in the last 168 hours. No results for input(s): AMMONIA in the last 168 hours. Coagulation Profile: Recent Labs  Lab 05/01/17 0157  INR 1.18   Cardiac Enzymes: Recent Labs  Lab 05/01/17 0316 05/01/17 0803 05/01/17 1918 05/02/17 0106 05/02/17 0642  TROPONINI 0.04* 1.57* 2.62* 2.61* 3.21*   BNP (last 3 results) No results for input(s): PROBNP in the last 8760 hours. HbA1C: No results for input(s): HGBA1C in the last 72 hours. CBG: Recent Labs  Lab 05/02/17 0755  GLUCAP 133*   Lipid Profile: Recent Labs     05/01/17 0316 05/01/17 0803  CHOL 224* 232*  HDL 28* 33*  LDLCALC 155* 170*  TRIG 207* 147  CHOLHDL 8.0 7.0   Thyroid Function Tests: No results for input(s): TSH, T4TOTAL, FREET4, T3FREE, THYROIDAB in the last 72 hours. Anemia Panel: No results for input(s): VITAMINB12, FOLATE, FERRITIN, TIBC, IRON, RETICCTPCT in the last 72 hours. Sepsis Labs: No results for input(s): PROCALCITON, LATICACIDVEN in the last 168 hours.  No results found for this or any previous visit (from the past 240 hour(s)).       Radiology Studies: Dg Chest 2 View  Result Date: 05/01/2017 CLINICAL DATA:  Acute onset of left-sided chest pain, radiating to the jaw. EXAM: CHEST -  2 VIEW COMPARISON:  None. FINDINGS: The lungs are well-aerated and clear. There is no evidence of focal opacification, pleural effusion or pneumothorax. The heart is borderline enlarged. The patient is status post median sternotomy. No acute osseous abnormalities are seen. Chronic left-sided rib deformities are noted. IMPRESSION: Borderline cardiomegaly.  Lungs remain grossly clear. Electronically Signed   By: Garald Balding M.D.   On: 05/01/2017 01:04   Dg Chest Port 1 View  Result Date: 05/01/2017 CLINICAL DATA:  Acute onset of left central chest pain. EXAM: PORTABLE CHEST 1 VIEW COMPARISON:  Chest radiograph performed earlier today at 12:23 Valborg Friar.m. FINDINGS: The lungs are well-aerated. Mild bibasilar atelectasis is noted. There is no evidence of pleural effusion or pneumothorax. The cardiomediastinal silhouette is borderline normal in size. The patient is status post median sternotomy. No acute osseous abnormalities are seen. Chronic left-sided rib deformities are noted. IMPRESSION: Mild bibasilar atelectasis.  Lungs otherwise clear. Electronically Signed   By: Garald Balding M.D.   On: 05/01/2017 05:27        Scheduled Meds: . allopurinol  100 mg Oral Daily  . amLODipine  10 mg Oral Daily  . aspirin EC  81 mg Oral QHS  . brimonidine   1 drop Both Eyes QODAY  . clopidogrel  75 mg Oral Daily  . doxazosin  4 mg Oral Daily  . ezetimibe  10 mg Oral Daily  . fenofibrate  160 mg Oral Daily  . FLUoxetine  20 mg Oral Daily  . hydrALAZINE  75 mg Oral BID  . Melatonin  3 mg Oral QHS  . metoprolol tartrate  50 mg Oral BID  . oseltamivir  30 mg Oral Daily   Continuous Infusions: . azithromycin Stopped (05/02/17 0339)  . cefTRIAXone (ROCEPHIN)  IV Stopped (05/02/17 0504)  . heparin 1,550 Units/hr (05/02/17 0500)  . nitroGLYCERIN 10 mcg/min (05/02/17 0715)     LOS: 1 day    Time spent: over 32 min    Fayrene Helper, MD [facility] Pager [phone]   If 7PM-7AM, please contact night-coverage [email or web address] Password [facility] 05/02/2017, 8:27 AM

## 2017-05-03 ENCOUNTER — Inpatient Hospital Stay (HOSPITAL_COMMUNITY): Payer: Medicare Other

## 2017-05-03 DIAGNOSIS — D631 Anemia in chronic kidney disease: Secondary | ICD-10-CM

## 2017-05-03 DIAGNOSIS — I361 Nonrheumatic tricuspid (valve) insufficiency: Secondary | ICD-10-CM

## 2017-05-03 LAB — BASIC METABOLIC PANEL
ANION GAP: 11 (ref 5–15)
BUN: 57 mg/dL — AB (ref 6–20)
CO2: 18 mmol/L — ABNORMAL LOW (ref 22–32)
Calcium: 8.4 mg/dL — ABNORMAL LOW (ref 8.9–10.3)
Chloride: 104 mmol/L (ref 101–111)
Creatinine, Ser: 2.94 mg/dL — ABNORMAL HIGH (ref 0.61–1.24)
GFR calc Af Amer: 23 mL/min — ABNORMAL LOW (ref >60)
GFR calc non Af Amer: 20 mL/min — ABNORMAL LOW (ref >60)
Glucose, Bld: 130 mg/dL — ABNORMAL HIGH (ref 65–99)
POTASSIUM: 4.1 mmol/L (ref 3.5–5.1)
SODIUM: 133 mmol/L — AB (ref 135–145)

## 2017-05-03 LAB — MAGNESIUM: MAGNESIUM: 2 mg/dL (ref 1.7–2.4)

## 2017-05-03 LAB — CBC
HCT: 33.1 % — ABNORMAL LOW (ref 39.0–52.0)
HEMOGLOBIN: 10.7 g/dL — AB (ref 13.0–17.0)
MCH: 31.9 pg (ref 26.0–34.0)
MCHC: 32.3 g/dL (ref 30.0–36.0)
MCV: 98.8 fL (ref 78.0–100.0)
Platelets: 161 10*3/uL (ref 150–400)
RBC: 3.35 MIL/uL — AB (ref 4.22–5.81)
RDW: 14.5 % (ref 11.5–15.5)
WBC: 7.9 10*3/uL (ref 4.0–10.5)

## 2017-05-03 LAB — HEPARIN LEVEL (UNFRACTIONATED): Heparin Unfractionated: 0.4 IU/mL (ref 0.30–0.70)

## 2017-05-03 LAB — TROPONIN I: Troponin I: 2.97 ng/mL (ref <0.03)

## 2017-05-03 MED ORDER — SODIUM CHLORIDE 0.9 % IV SOLN: INTRAVENOUS | Status: DC

## 2017-05-03 MED ORDER — SODIUM CHLORIDE 0.9 % IV SOLN: 250.0000 mL | INTRAVENOUS | Status: DC | PRN

## 2017-05-03 MED ORDER — SODIUM CHLORIDE 0.9% FLUSH: 3.0000 mL | INTRAVENOUS | Status: DC | PRN

## 2017-05-03 MED ORDER — FLUOXETINE HCL 20 MG PO CAPS: 20.0000 mg | ORAL_CAPSULE | Freq: Every day | ORAL | Status: DC

## 2017-05-03 MED ORDER — ASPIRIN 81 MG PO CHEW
81.0000 mg | CHEWABLE_TABLET | ORAL | Status: AC
  Administered 2017-05-04: 81 mg via ORAL
  Filled 2017-05-03: qty 1

## 2017-05-03 MED ORDER — SODIUM CHLORIDE 0.9% FLUSH
3.0000 mL | Freq: Two times a day (BID) | INTRAVENOUS | Status: DC
  Administered 2017-05-04 – 2017-05-13 (×15): 3 mL via INTRAVENOUS

## 2017-05-03 MED ORDER — POLYETHYLENE GLYCOL 3350 17 G PO PACK
17.0000 g | PACK | Freq: Two times a day (BID) | ORAL | Status: DC
  Administered 2017-05-04 – 2017-05-18 (×22): 17 g via ORAL
  Filled 2017-05-03 (×26): qty 1

## 2017-05-03 MED ORDER — ALPRAZOLAM 0.25 MG PO TABS
0.2500 mg | ORAL_TABLET | Freq: Every day | ORAL | Status: DC | PRN
  Administered 2017-05-03 (×2): 0.25 mg via ORAL
  Filled 2017-05-03 (×3): qty 1

## 2017-05-03 NOTE — Plan of Care (Signed)
Patient has progressed well today. He has been up to the bedside commode and urinated post foley removal as well as having a bowel movement. His pain level has remained a zero throughout the day. The only PRN medication that he has needed has been the tessalon pearls for his cough which have been effective. Patient is currently sleeping with CPAP on.

## 2017-05-03 NOTE — Progress Notes (Signed)
Lab attempting to draw pt blood work at this time. Pt increasingly agitated at multiple attempts to get blood. Morphine given. Pt asked multiple times for phlebotomist to stop. Pt educated on importance of lab draw. Lab unable to draw 0000 labs, told RN they will try again on their morning rounds.

## 2017-05-03 NOTE — Progress Notes (Signed)
ANTICOAGULATION CONSULT NOTE  Pharmacy Consult: Heparin  Indication: chest pain/ACS  Allergies  Allergen Reactions  . Statins Other (See Comments)    Full body muscle stuffiness/pain    Patient Measurements: Weight = 118.8 kg Height = 67 inches  Vital Signs: Temp: 99.8 F (37.7 C) (03/17 0543) Temp Source: Oral (03/17 0543) BP: 123/47 (03/17 0700)  Labs: Recent Labs    05/01/17 0004  05/01/17 0006 05/01/17 0157 05/01/17 0316  05/02/17 0236 05/02/17 1610 05/02/17 1102 05/02/17 1852 05/03/17 0255  HGB 12.7*  --  12.6*  --   --   --   --  11.6*  --   --  10.7*  HCT 37.2*  --  37.0*  --   --   --   --  35.0*  --   --  33.1*  PLT 218  --   --   --   --   --   --  172  --   --  161  LABPROT  --   --   --  14.9  --   --   --   --   --   --   --   INR  --   --   --  1.18  --   --   --   --   --   --   --   HEPARINUNFRC  --   --   --   --   --    < > 0.31  --  0.24*  --  0.40  CREATININE  --    < > 3.40*  --  2.84*  --   --  2.97*  --   --  2.94*  TROPONINI  --   --   --   --  0.04*   < >  --  3.21* 2.80* 2.63* 2.97*   < > = values in this interval not displayed.    Assessment: 43 YOM continues on IV heparin for ACS.   Heparin level low this morning at goal, no issues noted, hgb down to 10.7. No rate changes indicated, will recheck levels in the morning.   Goal of Therapy:  Heparin level 0.3-0.7 units/ml Monitor platelets by anticoagulation protocol: Yes   Plan:  Continue heparin at 1750 units/hr Daily heparin level, cbc  Erin Hearing PharmD., BCPS Clinical Pharmacist 05/03/2017 7:49 AM

## 2017-05-03 NOTE — Progress Notes (Signed)
Progress Note  Patient Name: Nathan Wolfe Date of Encounter: 05/03/2017  Primary Cardiologist: Quay Burow, MD   Subjective   Feeling OK.  Had chest pain 30 minutes overnight.  None today despite stopping nitroglycerin drip.   Inpatient Medications    Scheduled Meds: . allopurinol  100 mg Oral Daily  . amLODipine  10 mg Oral Daily  . aspirin EC  81 mg Oral QHS  . brimonidine  1 drop Both Eyes QODAY  . chlorhexidine  15 mL Mouth Rinse BID  . clopidogrel  75 mg Oral Daily  . docusate sodium  100 mg Oral Daily  . doxazosin  4 mg Oral Daily  . ezetimibe  10 mg Oral Daily  . fenofibrate  160 mg Oral Daily  . FLUoxetine  20 mg Oral Daily  . hydrALAZINE  75 mg Oral BID  . mouth rinse  15 mL Mouth Rinse q12n4p  . Melatonin  3 mg Oral QHS  . metoprolol tartrate  50 mg Oral BID  . oseltamivir  30 mg Oral Daily   Continuous Infusions: . sodium chloride 75 mL/hr at 05/03/17 0700  . azithromycin Stopped (05/03/17 0128)  . cefTRIAXone (ROCEPHIN)  IV Stopped (05/03/17 0128)  . heparin 1,750 Units/hr (05/03/17 0600)  . nitroGLYCERIN 4 mcg/min (05/03/17 1125)   PRN Meds: acetaminophen, ALPRAZolam, benzonatate, diphenhydrAMINE, gi cocktail, hydrALAZINE, ipratropium-albuterol, morphine injection, ondansetron (ZOFRAN) IV   Vital Signs    Vitals:   05/03/17 1000 05/03/17 1100 05/03/17 1119 05/03/17 1200  BP: 108/76 (!) 96/39 (!) 112/47 (!) 111/48  Pulse:      Resp: 18 18 16 18   Temp:      TempSrc:      SpO2:      Weight:      Height:        Intake/Output Summary (Last 24 hours) at 05/03/2017 1231 Last data filed at 05/03/2017 1200 Gross per 24 hour  Intake 2097.96 ml  Output 2100 ml  Net -2.04 ml   Filed Weights   05/01/17 1700 05/02/17 0231 05/03/17 0500  Weight: 262 lb (118.8 kg) 261 lb 14.5 oz (118.8 kg) 273 lb 5.9 oz (124 kg)    Telemetry    Sinus rhythm.  Up to 3 beats of NSVT.  PVCs.- Personally Reviewed  ECG    Sinus rhythm.  Rate 67 bpm.  Lateral ST  depression.- Personally Reviewed  Physical Exam   VS:  BP (!) 111/48   Pulse 78   Temp 98.6 F (37 C) (Oral)   Resp 18   Ht 5\' 7"  (1.702 m)   Wt 273 lb 5.9 oz (124 kg)   SpO2 97%   BMI 42.82 kg/m  , BMI Body mass index is 42.82 kg/m. GENERAL:  Well-appearing. No acute distress.  HEENT: Pupils equal round and reactive, fundi not visualized, oral mucosa unremarkable NECK:  No jugular venous distention, waveform within normal limits, carotid upstroke brisk and symmetric, no bruits LUNGS: CTAB.  No crackles, wheezes or rhonchi.  HEART:  RRR.  PMI not displaced or sustained,S1 and S2 within normal limits, no S3, no S4, no clicks, no rubs, no murmurs ABD:  Flat, positive bowel sounds normal in frequency in pitch, no bruits, no rebound, no guarding, no midline pulsatile mass, no hepatomegaly, no splenomegaly EXT:  2 plus pulses throughout, no edema, no cyanosis no clubbing SKIN:  No rashes no nodules NEURO:  Cranial nerves II through XII grossly intact, motor grossly intact throughout PSYCH:  Cognitively intact, oriented  to person place and time   Labs    Chemistry Recent Labs  Lab 05/01/17 0316 05/02/17 0642 05/03/17 0255  NA 141 138 133*  K 3.4* 4.2 4.1  CL 112* 108 104  CO2 20* 17* 18*  GLUCOSE 116* 128* 130*  BUN 65* 54* 57*  CREATININE 2.84* 2.97* 2.94*  CALCIUM 8.5* 9.2 8.4*  PROT 6.0*  --   --   ALBUMIN 3.3*  --   --   AST 22  --   --   ALT 23  --   --   ALKPHOS 32*  --   --   BILITOT 0.5  --   --   GFRNONAA 21* 19* 20*  GFRAA 24* 23* 23*  ANIONGAP 9 13 11      Hematology Recent Labs  Lab 05/01/17 0004 05/01/17 0006 05/02/17 0642 05/03/17 0255  WBC 9.6  --  9.0 7.9  RBC 3.81*  --  3.54* 3.35*  HGB 12.7* 12.6* 11.6* 10.7*  HCT 37.2* 37.0* 35.0* 33.1*  MCV 97.6  --  98.9 98.8  MCH 33.3  --  32.8 31.9  MCHC 34.1  --  33.1 32.3  RDW 13.8  --  14.4 14.5  PLT 218  --  172 161    Cardiac Enzymes Recent Labs  Lab 05/02/17 0642 05/02/17 1102  05/02/17 1852 05/03/17 0255  TROPONINI 3.21* 2.80* 2.63* 2.97*    Recent Labs  Lab 05/01/17 0004  TROPIPOC 0.00     BNP Recent Labs  Lab 05/01/17 0316  BNP 366.5*     DDimer No results for input(s): DDIMER in the last 168 hours.   Radiology    No results found.  Cardiac Studies   Echo 05/03/17: Study Conclusions  - Left ventricle: The cavity size was normal. Wall thickness was   increased in a pattern of mild LVH. Features are consistent with   a pseudonormal left ventricular filling pattern, with concomitant   abnormal relaxation and increased filling pressure (grade 2   diastolic dysfunction). Doppler parameters are consistent with   high ventricular filling pressure. - Aortic valve: Transvalvular velocity was within the normal range.   There was no stenosis. There was no regurgitation. - Mitral valve: Transvalvular velocity was within the normal range.   There was no evidence for stenosis. There was trivial   regurgitation. - Left atrium: The atrium was severely dilated. - Right ventricle: The cavity size was normal. Wall thickness was   normal. Systolic function was normal. - Atrial septum: No defect or patent foramen ovale was identified   by color flow Doppler. - Tricuspid valve: There was mild regurgitation. - Pulmonary arteries: Systolic pressure was moderately to severely   increased. PA peak pressure: 59 mm Hg (S).   Patient Profile     72 y.o. male with CAD s/p CABG and PCI, hypertension, hyperlipidemia, carotid stenosis s/p L CEA 09/2016, CKD IV, OSA on CPAP, morbid obesity, here with NSTEMI and influenza.    Assessment & Plan    # NSTEMI: # CAD s/p CABG/PCI: # Hyperlipidemia:  Status post CABG in 2000 and redo in 2011.  Troponin peaked at 3.21, went down to 2.6 then up to 2.97.  He had an episodes of chest pain overnight.  Nitroglycerin drip was weaned.  He continues to have dynamic lateral EKG changes.   Continue aspirin, clopidogrel, heparin,  and metoprolol.  Continue fenofibrate and ezetimibe. Consider PCSK9 inhibitor at discharge.   Cath Monday with Dr. Gwenlyn Found.  He  understand the risk of worsening renal function and HD.    # Influenza:  # Possible CAP: Still with low grade fever on tamiflu, ceftriazone and azithromycin.  # Hypertension: Blood pressure controlled.  Continue metoprolol, amlodipine, hydralazine, and doxazosin.  # CKD IV: Creatinine stable.  Baseline unknown.  Possible need for HD after cath as above.  For questions or updates, please contact [place] Please consult [email or web address] for contact info under Cardiology/STEMI.      Signed, Skeet Latch, MD  05/03/2017, 12:31 PM

## 2017-05-03 NOTE — Progress Notes (Addendum)
PROGRESS NOTE    Nathan Wolfe  VQM:[id number] DOB: [birth year] DOA: 04/30/2017 PCP: Patient, No Pcp Per   Brief Narrative:  Nathan Wolfe is Nathan Wolfe 72 y.o. 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  Patient reports symptoms did not feel like acid reflux.  Reports pain radiating into his jaw similar to previous heart attacks.  Patient reports taking approximately 4 nitros with just temporary relief of symptoms before return of pain symptoms.  En route with EMS patient was given 324 mg.   Assessment & Plan:   Active Problems:   Unstable angina (HCC)   Anemia due to chronic kidney disease   Essential hypertension   OSA on CPAP   Unstable angina  NSTEMI:   Known history of coronary artery disease.  Patient received 324 mg aspirin in route.  EKG showing signs of ischemia (ST depression in lateral leads).  Cardiology consulted and started patient on heparin drip. Last CP occurred last night after eating.  No CP since.   EKG yesterday poor quality, but persistent ST depressions  - Nitroglycerin drip off - Heparin drip per pharmacy - Check chest x-ray (atelectasis) - Trend cardiac troponins (trending up again this AM) - Echo with grade 2 diastolic dysfunction (see report) - Continue to follow repeat EKG's prn  - Dr. Gwenlyn Found spoke to cardiologist Dr. Darcey Nora (see note regarding cath hx) - Appreciate cardiology consultative services will follow-up for further recommendations - planning for cath Monday  Influenza Nathan Wolfe  Fever:  Likely reason for fever and cough.  Started on tamiflu 3/15 PM as well as ceftriazone and azithromycin for possible CAP.   Follow  blood cx x 2 pending CXR with mild biasilar atelectasis    Essential hypertension - Continue metoprolol (50 BID), hydralazine (increased to 75 BID), amlodipine (increased to 10) - hold lasix and isosorbide  CKD stage IV: Patient presents with creatinine of 3.4 with BUN 60 is not exactly sure of his baseline creatinine, but notes that his GFR last was somewhere around 23.  He is followed by nephrology but does not want to be on dialysis. - creatinine 2.94 today - gentle hydration in preparation for cath, ctm closely  Anemia of chronic disease: downtrending slightly with hydration - Continue to monitor  BPH - Continue Cardura - foley placed last night, will plan for trial of void today   Dyslipidemia - Continue home medication - zetia added - consider PCSK9 as outpatient  OSA on CPAP - RT to supply CPAP at night  NAGMA: mild, continue to monitor   Constipation - miralax BID  DVT prophylaxis: heparin gtt Code Status: full  Family Communication: daugthers at bedside Disposition Plan: pending cardiology w/u   Consultants:   cardiology  Procedures:  Study Conclusions  - Left ventricle: The cavity size was normal. Wall thickness was   increased in Nathan Wolfe pattern of mild LVH. Features are consistent with   Nathan Wolfe left ventricular filling pattern, with concomitant   abnormal relaxation and increased filling pressure (grade 2   diastolic dysfunction). Doppler parameters are consistent with   high ventricular filling pressure. - Aortic valve: Transvalvular velocity was within the normal range.   There was no stenosis. There  was no regurgitation. - Mitral valve: Transvalvular velocity was within the normal range.   There was no evidence for stenosis. There was trivial   regurgitation. - Left atrium: The atrium was severely dilated. - Right ventricle: The cavity size was normal. Wall thickness was   normal. Systolic function was normal. - Atrial septum: No defect  or patent foramen ovale was identified   by color flow Doppler. - Tricuspid valve: There was mild regurgitation. - Pulmonary arteries: Systolic pressure was moderately to severely   increased. PA peak pressure: 59 mm Hg (S).   Antimicrobials:  none    Subjective: Coughing better. CP last night after eating. None since.  Constipated.  Objective: Vitals:   05/03/17 1119 05/03/17 1200 05/03/17 1245 05/03/17 1300  BP: (!) 112/47 (!) 111/48  (!) 108/49  Pulse:      Resp: 16 18  (!) 21  Temp:   98.1 F (36.7 C)   TempSrc:   Oral   SpO2:      Weight:      Height:        Intake/Output Summary (Last 24 hours) at 05/03/2017 1405 Last data filed at 05/03/2017 1300 Gross per 24 hour  Intake 2417.26 ml  Output 2100 ml  Net 317.26 ml   Filed Weights   05/01/17 1700 05/02/17 0231 05/03/17 0500  Weight: 118.8 kg (262 lb) 118.8 kg (261 lb 14.5 oz) 124 kg (273 lb 5.9 oz)    Examination:  General: No acute distress. Cardiovascular: Heart sounds show Nathan Wolfe regular rate, and rhythm. No gallops or rubs. No murmurs. No JVD. Lungs: Clear to auscultation bilaterally with good air movement. No rales, rhonchi or wheezes. Abdomen: Soft, nontender, nondistended with normal active bowel sounds. No masses. No hepatosplenomegaly. Neurological: Alert and oriented 3. Moves all extremities 4. Cranial nerves II through XII grossly intact. Skin: Warm and dry. No rashes or lesions. Extremities: No clubbing or cyanosis. No edema.  Psychiatric: Mood and affect are normal. Insight and judgment are appropriate.     Data Reviewed: I have personally reviewed following labs and imaging studies  CBC: Recent Labs  Lab 05/01/17 0004 05/01/17 0006 05/02/17 0642 05/03/17 0255  WBC 9.6  --  9.0 7.9  NEUTROABS 7.1  --   --   --   HGB 12.7* 12.6* 11.6* 10.7*  HCT 37.2* 37.0* 35.0* 33.1*  MCV 97.6  --  98.9 98.8  PLT 218  --  172 710   Basic Metabolic Panel: Recent Labs  Lab 05/01/17 0006  05/01/17 0316 05/01/17 0803 05/02/17 0642 05/02/17 1102 05/03/17 0255  NA 143 141  --  138  --  133*  K 3.9 3.4*  --  4.2  --  4.1  CL 109 112*  --  108  --  104  CO2  --  20*  --  17*  --  18*  GLUCOSE 167* 116*  --  128*  --  130*  BUN 60* 65*  --  54*  --  57*  CREATININE 3.40* 2.84*  --  2.97*  --  2.94*  CALCIUM  --  8.5*  --  9.2  --  8.4*  MG  --   --  2.1  --  1.9 2.0   GFR: Estimated Creatinine Clearance: 28.3 mL/min (Cerys Winget) (by C-G formula based on SCr of 2.94 mg/dL (H)). Liver Function Tests: Recent Labs  Lab 05/01/17 0316  AST 22  ALT 23  ALKPHOS 32*  BILITOT 0.5  PROT 6.0*  ALBUMIN 3.3*   No results for input(s): LIPASE, AMYLASE in the last 168 hours. No results for input(s): AMMONIA in the last 168 hours. Coagulation Profile: Recent Labs  Lab 05/01/17 0157  INR 1.18   Cardiac Enzymes: Recent Labs  Lab 05/02/17 0106 05/02/17 0642 05/02/17 1102 05/02/17 1852 05/03/17 0255  TROPONINI 2.61* 3.21* 2.80* 2.63* 2.97*   BNP (last 3 results) No results for input(s): PROBNP in the last 8760 hours. HbA1C: No results for input(s): HGBA1C in the last 72 hours. CBG: Recent Labs  Lab 05/02/17 0755  GLUCAP 133*   Lipid Profile: Recent Labs    05/01/17 0316 05/01/17 0803  CHOL 224* 232*  HDL 28* 33*  LDLCALC 155* 170*  TRIG 207* 147  CHOLHDL 8.0 7.0   Thyroid Function Tests: No results for input(s): TSH, T4TOTAL, FREET4, T3FREE, THYROIDAB in the last 72 hours. Anemia Panel: No results for input(s): VITAMINB12, FOLATE, FERRITIN, TIBC, IRON, RETICCTPCT in the last 72 hours. Sepsis Labs: No results for input(s): PROCALCITON, LATICACIDVEN in the last 168 hours.  Recent Results (from the past 240 hour(s))  MRSA PCR Screening     Status: None   Collection Time: 05/02/17  9:45 AM  Result Value Ref Range Status   MRSA by PCR NEGATIVE NEGATIVE Final    Comment:        The GeneXpert MRSA Assay (FDA approved for NASAL specimens only), is one component  of [facility] comprehensive MRSA colonization surveillance program. It is not intended to diagnose MRSA infection nor to guide or monitor treatment for MRSA infections. Performed at [facility], [place] [address], [place], [place] [address]          Radiology Studies: No results found.      Scheduled Meds: . allopurinol  100 mg Oral Daily  . amLODipine  10 mg Oral Daily  . aspirin EC  81 mg Oral QHS  . brimonidine  1 drop Both Eyes QODAY  . chlorhexidine  15 mL Mouth Rinse BID  . clopidogrel  75 mg Oral Daily  . docusate sodium  100 mg Oral Daily  . doxazosin  4 mg Oral Daily  . ezetimibe  10 mg Oral Daily  . fenofibrate  160 mg Oral Daily  . FLUoxetine  20 mg Oral Daily  . hydrALAZINE  75 mg Oral BID  . mouth rinse  15 mL Mouth Rinse q12n4p  . Melatonin  3 mg Oral QHS  . metoprolol tartrate  50 mg Oral BID  . oseltamivir  30 mg Oral Daily  . polyethylene glycol  17 g Oral BID   Continuous Infusions: . sodium chloride 75 mL/hr at 05/03/17 0700  . azithromycin Stopped (05/03/17 0128)  . cefTRIAXone (ROCEPHIN)  IV Stopped (05/03/17 0128)  . heparin 1,750 Units/hr (05/03/17 0600)  . nitroGLYCERIN Stopped (05/03/17 1200)     LOS: 2 days    Time spent: over 30 min    Fayrene Helper, MD [facility] Pager [phone]   If 7PM-7AM, please contact night-coverage [email or web address] Password [email or web address] 05/03/2017, 2:05 PM

## 2017-05-03 NOTE — Progress Notes (Signed)
  Echocardiogram 2D Echocardiogram has been performed.  Nathan Wolfe 05/03/2017, 8:53 AM

## 2017-05-04 ENCOUNTER — Encounter (HOSPITAL_COMMUNITY)
Admission: EM | Disposition: A | Discharge status: Home Health | Payer: Self-pay | Source: Home / Self Care | Attending: Family Medicine

## 2017-05-04 ENCOUNTER — Inpatient Hospital Stay (HOSPITAL_COMMUNITY): Payer: Medicare Other

## 2017-05-04 DIAGNOSIS — R05 Cough: Secondary | ICD-10-CM

## 2017-05-04 DIAGNOSIS — I248 Other forms of acute ischemic heart disease: Secondary | ICD-10-CM

## 2017-05-04 DIAGNOSIS — J101 Influenza due to other identified influenza virus with other respiratory manifestations: Secondary | ICD-10-CM

## 2017-05-04 LAB — CBC
HCT: 34.4 % — ABNORMAL LOW (ref 39.0–52.0)
HEMOGLOBIN: 11.4 g/dL — AB (ref 13.0–17.0)
MCH: 32.7 pg (ref 26.0–34.0)
MCHC: 33.1 g/dL (ref 30.0–36.0)
MCV: 98.6 fL (ref 78.0–100.0)
PLATELETS: 144 10*3/uL — AB (ref 150–400)
RBC: 3.49 MIL/uL — AB (ref 4.22–5.81)
RDW: 14.4 % (ref 11.5–15.5)
WBC: 6.9 10*3/uL (ref 4.0–10.5)

## 2017-05-04 LAB — COMPREHENSIVE METABOLIC PANEL
ALT: 29 U/L (ref 17–63)
ANION GAP: 12 (ref 5–15)
AST: 42 U/L — ABNORMAL HIGH (ref 15–41)
Albumin: 3.1 g/dL — ABNORMAL LOW (ref 3.5–5.0)
Alkaline Phosphatase: 24 U/L — ABNORMAL LOW (ref 38–126)
BUN: 54 mg/dL — ABNORMAL HIGH (ref 6–20)
CHLORIDE: 107 mmol/L (ref 101–111)
CO2: 16 mmol/L — AB (ref 22–32)
Calcium: 8.8 mg/dL — ABNORMAL LOW (ref 8.9–10.3)
Creatinine, Ser: 2.72 mg/dL — ABNORMAL HIGH (ref 0.61–1.24)
GFR calc non Af Amer: 22 mL/min — ABNORMAL LOW (ref >60)
GFR, EST AFRICAN AMERICAN: 25 mL/min — AB (ref >60)
GLUCOSE: 105 mg/dL — AB (ref 65–99)
Potassium: 4.8 mmol/L (ref 3.5–5.1)
SODIUM: 135 mmol/L (ref 135–145)
Total Bilirubin: 0.9 mg/dL (ref 0.3–1.2)
Total Protein: 6.5 g/dL (ref 6.5–8.1)

## 2017-05-04 LAB — EXPECTORATED SPUTUM ASSESSMENT W REFEX TO RESP CULTURE

## 2017-05-04 LAB — EXPECTORATED SPUTUM ASSESSMENT W GRAM STAIN, RFLX TO RESP C

## 2017-05-04 LAB — HEPARIN LEVEL (UNFRACTIONATED): HEPARIN UNFRACTIONATED: 0.42 [IU]/mL (ref 0.30–0.70)

## 2017-05-04 LAB — MAGNESIUM: Magnesium: 2.1 mg/dL (ref 1.7–2.4)

## 2017-05-04 LAB — MRSA PCR SCREENING: MRSA by PCR: NEGATIVE

## 2017-05-04 LAB — TROPONIN I: TROPONIN I: 1.95 ng/mL — AB (ref <0.03)

## 2017-05-04 SURGERY — LEFT HEART CATH AND CORS/GRAFTS ANGIOGRAPHY
Anesthesia: LOCAL

## 2017-05-04 MED ORDER — IPRATROPIUM-ALBUTEROL 0.5-2.5 (3) MG/3ML IN SOLN: 3.0000 mL | RESPIRATORY_TRACT | Status: DC

## 2017-05-04 MED ORDER — IPRATROPIUM-ALBUTEROL 0.5-2.5 (3) MG/3ML IN SOLN
3.0000 mL | RESPIRATORY_TRACT | Status: DC
  Administered 2017-05-04 – 2017-05-06 (×10): 3 mL via RESPIRATORY_TRACT
  Filled 2017-05-04: qty 9
  Filled 2017-05-04 (×8): qty 3

## 2017-05-04 MED ORDER — FUROSEMIDE 10 MG/ML IJ SOLN
40.0000 mg | Freq: Two times a day (BID) | INTRAMUSCULAR | Status: DC
  Administered 2017-05-04 – 2017-05-09 (×10): 40 mg via INTRAVENOUS
  Filled 2017-05-04 (×10): qty 4

## 2017-05-04 MED ORDER — VANCOMYCIN HCL 10 G IV SOLR
2000.0000 mg | Freq: Once | INTRAVENOUS | Status: AC
  Administered 2017-05-04: 2000 mg via INTRAVENOUS
  Filled 2017-05-04: qty 2000

## 2017-05-04 MED ORDER — ALPRAZOLAM 0.5 MG PO TABS
0.5000 mg | ORAL_TABLET | Freq: Three times a day (TID) | ORAL | Status: DC | PRN
  Administered 2017-05-04 – 2017-05-18 (×21): 0.5 mg via ORAL
  Filled 2017-05-04 (×22): qty 1

## 2017-05-04 MED ORDER — SODIUM CHLORIDE 0.9 % IV SOLN: 1750.0000 mg | INTRAVENOUS | Status: DC

## 2017-05-04 MED ORDER — NITROGLYCERIN IN D5W 200-5 MCG/ML-% IV SOLN
INTRAVENOUS | Status: AC
  Filled 2017-05-04: qty 250

## 2017-05-04 MED ORDER — IPRATROPIUM-ALBUTEROL 0.5-2.5 (3) MG/3ML IN SOLN
3.0000 mL | Freq: Four times a day (QID) | RESPIRATORY_TRACT | Status: DC | PRN
  Administered 2017-05-06: 3 mL via RESPIRATORY_TRACT
  Filled 2017-05-04: qty 3

## 2017-05-04 MED ORDER — CEFEPIME-DEXTROSE 2-5 GM-%(50ML) IV SOLR
2.0000 g | INTRAVENOUS | Status: DC
  Filled 2017-05-04: qty 50

## 2017-05-04 MED ORDER — SODIUM CHLORIDE 0.9 % IV SOLN
2.0000 g | INTRAVENOUS | Status: DC
  Administered 2017-05-04 – 2017-05-05 (×2): 2 g via INTRAVENOUS
  Filled 2017-05-04 (×3): qty 2

## 2017-05-04 MED ORDER — ISOSORBIDE MONONITRATE ER 60 MG PO TB24
120.0000 mg | ORAL_TABLET | Freq: Every day | ORAL | Status: DC
  Administered 2017-05-04 – 2017-05-18 (×15): 120 mg via ORAL
  Filled 2017-05-04 (×15): qty 2

## 2017-05-04 MED ORDER — LACTATED RINGERS IV SOLN
INTRAVENOUS | Status: DC
Start: 2017-05-04 — End: 2017-05-04
  Administered 2017-05-04: 75 mL/h via INTRAVENOUS

## 2017-05-04 MED ORDER — NITROGLYCERIN IN D5W 200-5 MCG/ML-% IV SOLN
2.0000 ug/min | INTRAVENOUS | Status: DC
  Administered 2017-05-04: 5 ug/min via INTRAVENOUS

## 2017-05-04 MED ORDER — NITROGLYCERIN 0.4 MG SL SUBL
0.4000 mg | SUBLINGUAL_TABLET | SUBLINGUAL | Status: DC | PRN
  Administered 2017-05-05 – 2017-05-15 (×8): 0.4 mg via SUBLINGUAL
  Filled 2017-05-04 (×2): qty 1

## 2017-05-04 NOTE — Progress Notes (Signed)
Progress Note  Patient Name: Nathan Wolfe Date of Encounter: 05/04/2017  Primary Cardiologist: Quay Burow, MD   Subjective   The patient was lying in his bed today upon entering the room.  He stated that he was unable to sleep last night due to the amount of anxiety with the potential heart cath.  In addition, he has been coughing and has experienced generalized discomfort to the bed lack of pillow on his initial presentation.  Inpatient Medications    Scheduled Meds: . allopurinol  100 mg Oral Daily  . amLODipine  10 mg Oral Daily  . aspirin EC  81 mg Oral QHS  . brimonidine  1 drop Both Eyes QODAY  . chlorhexidine  15 mL Mouth Rinse BID  . clopidogrel  75 mg Oral Daily  . docusate sodium  100 mg Oral Daily  . doxazosin  4 mg Oral Daily  . ezetimibe  10 mg Oral Daily  . fenofibrate  160 mg Oral Daily  . FLUoxetine  20 mg Oral Daily  . hydrALAZINE  75 mg Oral BID  . ipratropium-albuterol  3 mL Nebulization Q4H  . isosorbide mononitrate  120 mg Oral Daily  . mouth rinse  15 mL Mouth Rinse q12n4p  . Melatonin  3 mg Oral QHS  . metoprolol tartrate  50 mg Oral BID  . oseltamivir  30 mg Oral Daily  . polyethylene glycol  17 g Oral BID  . sodium chloride flush  3 mL Intravenous Q12H   Continuous Infusions: . sodium chloride 75 mL/hr at 05/03/17 1900  . sodium chloride    . sodium chloride    . azithromycin 500 mg (05/03/17 2341)  . cefTRIAXone (ROCEPHIN)  IV Stopped (05/03/17 2319)  . heparin 1,750 Units/hr (05/04/17 0930)   PRN Meds: sodium chloride, acetaminophen, ALPRAZolam, benzonatate, diphenhydrAMINE, gi cocktail, hydrALAZINE, morphine injection, ondansetron (ZOFRAN) IV, sodium chloride flush   Vital Signs    Vitals:   05/04/17 0600 05/04/17 0700 05/04/17 0800 05/04/17 0806  BP:  (!) 155/54 (!) 141/57   Pulse:      Resp: (!) 22 (!) 21 (!) 21   Temp:    98.4 F (36.9 C)  TempSrc:    Oral  SpO2:      Weight:      Height:        Intake/Output  Summary (Last 24 hours) at 05/04/2017 1035 Last data filed at 05/04/2017 0800 Gross per 24 hour  Intake 2753.01 ml  Output 1486 ml  Net 1267.01 ml   Filed Weights   05/02/17 0231 05/03/17 0500 05/04/17 0456  Weight: 118.8 kg (261 lb 14.5 oz) 124 kg (273 lb 5.9 oz) 124.1 kg (273 lb 9.5 oz)    Telemetry    NSR- Personally Reviewed  ECG    Normal sinus rhythm, missing with 1, 2, and 3.  There is T wave inversion in leads V3 through V6-possibly consistent with anterolateral ischemia.  Personally Reviewed  Physical Exam   GEN: No acute distress.   Neck: No JVD. Unable to auscultate the right carotid. No bruit in left Cardiac: RRR, no murmurs, rubs, or gallops auscultated.  Distal pulses intact Respiratory:  Mild wheezing in the upper lung fields.  No rhonchi or rales appreciated GI: Soft, nontender, non-distended  MS: No edema; No deformity. Neuro:  Nonfocal  Psych: Normal affect   Labs    Chemistry Recent Labs  Lab 05/01/17 0316 05/02/17 0642 05/03/17 0255 05/04/17 0303  NA 141 138 133* 135  K 3.4* 4.2 4.1 4.8  CL 112* 108 104 107  CO2 20* 17* 18* 16*  GLUCOSE 116* 128* 130* 105*  BUN 65* 54* 57* 54*  CREATININE 2.84* 2.97* 2.94* 2.72*  CALCIUM 8.5* 9.2 8.4* 8.8*  PROT 6.0*  --   --  6.5  ALBUMIN 3.3*  --   --  3.1*  AST 22  --   --  42*  ALT 23  --   --  29  ALKPHOS 32*  --   --  24*  BILITOT 0.5  --   --  0.9  GFRNONAA 21* 19* 20* 22*  GFRAA 24* 23* 23* 25*  ANIONGAP 9 13 11 12      Hematology Recent Labs  Lab 05/02/17 0642 05/03/17 0255 05/04/17 0303  WBC 9.0 7.9 6.9  RBC 3.54* 3.35* 3.49*  HGB 11.6* 10.7* 11.4*  HCT 35.0* 33.1* 34.4*  MCV 98.9 98.8 98.6  MCH 32.8 31.9 32.7  MCHC 33.1 32.3 33.1  RDW 14.4 14.5 14.4  PLT 172 161 144*    Cardiac Enzymes Recent Labs  Lab 05/02/17 1102 05/02/17 1852 05/03/17 0255 05/04/17 0303  TROPONINI 2.80* 2.63* 2.97* 1.95*    Recent Labs  Lab 05/01/17 0004  TROPIPOC 0.00     BNP Recent Labs  Lab  05/01/17 0316  BNP 366.5*     DDimer No results for input(s): DDIMER in the last 168 hours.   Radiology    No results found.  Cardiac Studies   Echo 05/03/17: Study Conclusions  - Left ventricle: The cavity size was normal. Wall thickness was increased in a pattern of mild LVH. Features are consistent with a pseudonormal left ventricular filling pattern, with concomitant abnormal relaxation and increased filling pressure (grade 2 diastolic dysfunction). Doppler parameters are consistent with high ventricular filling pressure. - Aortic valve: Transvalvular velocity was within the normal range. There was no stenosis. There was no regurgitation. - Mitral valve: Transvalvular velocity was within the normal range. There was no evidence for stenosis. There was trivial regurgitation. - Left atrium: The atrium was severely dilated. - Right ventricle: The cavity size was normal. Wall thickness was normal. Systolic function was normal. - Atrial septum: No defect or patent foramen ovale was identified by color flow Doppler. - Tricuspid valve: There was mild regurgitation. - Pulmonary arteries: Systolic pressure was moderately to severely increased. PA peak pressure: 59 mm Hg (S).  Cardiac cath in March 2016 as per chart review from outside facility.  Last catheterization was March 2016. His EF is normal. He has a patent LIMA to the LAD with an occluded LAD beyond LIMA insertion. He has a patent vein graft to the right status post stenting of the PDA with a 2.5 mm Synergy drug-eluting stent to the vein graft and a diseased ramus branch was with not intervened on.  Patient Profile     72 y.o. male with past medical history notable for CAD status post three-vessel CABG in 2000 with redo bypass in 2011 with multiple PCI's, CKD stage IV.  He presented with unstable angina, cough was noted to have an elevated troponin without corresponding EKG changes.  Patient is  being treated for influenza A vs CAP pneumonia with associated cough, dyspnea, and generalized malaise.   Assessment & Plan    Unstable change angina/NSTEMI: The patient has a complex cardiac history with 2 CABGs in the past as well as multiple PCI's with stent placement.  His last cath was reported approximately 3 years prior  with stent placement at that time he was informed by his primary cardiologist Dr. Jori Moll that he was not a candidate for additional CABG nor stent placement at that time.  He was told for any intervention was necessary that he would require an additional bypass but did not believe he was a candidate for such.  Upon the conversation between the on-call STEMI cardiologist and the patient's primary cardiologist it was determined that heart cath may be needed given elevated troponins from 0.04-1.57 eventually 2.97 but this was complicated by his underlying CKD.   It is likely that the patient's underlying condition of influenza may be inducing his current symptoms.  As the patient's renal function is of concern with potential need for HD following a cath and as his chest pain has resolved times 36 hours with medical management, it is recommended that the patient undergo treatment for his influenza cardiac condition and reevaluation for potential cardiac catheterization at a later date. Echocardiogram indicating normal left ventricular cavity size with mildly increased wall thickness.  Grade 2 diastolic dysfunction no EF noted. -Troponins peaked at 2.97 back down to 1.95 no need to continue to trend at this time -Discontinue IV nitroglycerin -Initiate home dose Imdur 120 daily -Continue metoprolol tartrate 50 mg twice daily -Continue ASA, and Plavix  Hypertension: Patient is mildly hypertensive 141/57. This appears to be consistent with his pressures over there past few days. -Continue amlodipine 10mg  daily -Continue hydralazine 75mg  BID -Continue beta-blocker and  doxazosin  Influenza/ CAP: Continue Tamiflu, ceftriaxone, azithromycin.  Rest as per medical team.  Initial CXR on 3/15 failed to demonstrate focal opacity or pleural edema.  CKD 4:  Creatinine is slightly improved today with IV hydration.  We will continue this in anticipation of potential cardiac cath if patient's test fails to improve. -BMP in a.m.   For questions or updates, please contact [place] Please consult [email or web address] for contact info under Cardiology/STEMI.   Please see attending note/attestation for current assessment and plan.  Signed, Kathi Ludwig, MD  05/04/2017, 10:35 AM

## 2017-05-04 NOTE — Progress Notes (Addendum)
RT accessed patient per RT protocol. Scheduled nebulizer treatments not indicated per protocol. Upper airway wheeze noted. RT changed patient to PRN treatments. RT called to place patient on scheduled treatments per patient's family request. RT will continue to monitor.

## 2017-05-04 NOTE — Progress Notes (Signed)
ANTICOAGULATION CONSULT NOTE  Pharmacy Consult: Heparin  Indication: chest pain/ACS  Allergies  Allergen Reactions  . Statins Other (See Comments)    Full body muscle stuffiness/pain    Patient Measurements: Weight = 118.8 kg Height = 67 inches  Vital Signs: Temp: 98.4 F (36.9 C) (03/18 0806) Temp Source: Oral (03/18 0806) BP: 141/57 (03/18 0800)  Labs: Recent Labs    05/02/17 0642 05/02/17 1102 05/02/17 1852 05/03/17 0255 05/04/17 0303  HGB 11.6*  --   --  10.7* 11.4*  HCT 35.0*  --   --  33.1* 34.4*  PLT 172  --   --  161 144*  HEPARINUNFRC  --  0.24*  --  0.40 0.42  CREATININE 2.97*  --   --  2.94* 2.72*  TROPONINI 3.21* 2.80* 2.63* 2.97* 1.95*    Assessment: 71 YOM continues on IV heparin for ACS. Plan for cath postponed, evaluate 3/19 for CP in setting of influenza A.    Heparin level remains at goal at 0.42 on 1750 units/hr.  CBC low but stable, no bleeding observed.  Goal of Therapy:  Heparin level 0.3-0.7 units/ml Monitor platelets by anticoagulation protocol: Yes   Plan:  Continue heparin gtt at 1750 units/hr Monitor daily heparin level, CBC, s/s bleeding  Bertis Ruddy, PharmD Pharmacy Resident Pager #: [phone] 05/04/2017 10:39 AM

## 2017-05-04 NOTE — Progress Notes (Addendum)
PROGRESS NOTE    Nathan Wolfe  ZOX:[id number] DOB: [birth year] DOA: 04/30/2017 PCP: Patient, No Pcp Per   Brief Narrative:  Nathan Wolfe is Nathan Wolfe 72 y.o. 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  Patient reports symptoms did not feel like acid reflux.  Reports pain radiating into his jaw similar to previous heart attacks.  Patient reports taking approximately 4 nitros with just temporary relief of symptoms before return of pain symptoms.  En route with EMS patient was given 324 mg.   Assessment & Plan:   Active Problems:   Unstable angina (HCC)   Anemia due to chronic kidney disease   Essential hypertension   OSA on CPAP   Unstable angina  NSTEMI:   Known history of coronary artery disease.  Patient received 324 mg aspirin in route.  EKG showing signs of ischemia (ST depression in lateral leads).  Cardiology consulted and started patient on heparin drip. Last CP occurred 2 nights ago after eating.  No CP since.   - Nitroglycerin drip off - Heparin drip per pharmacy - ASA, plavix - Trend cardiac troponins (troponin downtrending) - Echo with grade 2 diastolic dysfunction (see report) - Continue to follow repeat EKG's prn  - Dr. Gwenlyn Found spoke to cardiologist Dr. Darcey Nora (see note regarding cath hx) - Appreciate cardiology consultative services will follow-up for further recommendations - Initially planning for catheterization 3/18, but planning to treat influenza and reevaluate for potential catheterization at Nathan Wolfe later date  Influenza Nathan Wolfe  Fever:  Likely reason for fever and cough.  Started on tamiflu 3/15 PM as well as ceftriazone and  azithromycin for possible CAP.   Will repeat CXR today  Follow blood cx x 2 NGTD x 1 CXR with mild biasilar atelectasis Addendum, with recurrent fever today.  CXR with new bilateral airspace disease (edema vs pneumonia).  Will broaden abx to include vanc/cefepime with worsening CXR.  Follow MRSA PCR, as well as sputum cx, repeat blood cx, and procalcitonin.  Narrow abx as able.      Addendum:  HFpEF Exacerbation: CXR as noted above with bilateral airspace disease, possibly edema (vs pneumonia).  Pt with orthopnea.  His lasix has been held since presentation in preparation for possible cath and he's received continuous IVF's in preparation for this.  Will diurese with 40 mg lasix IV BID.  Follow repeat CXR tomorrow  Essential hypertension - Continue metoprolol (50 BID), hydralazine (increased to 75 BID), amlodipine (increased to 10) - resume imdur - hold lasix   CKD stage IV: Patient presents with creatinine of 3.4 with BUN 60 is not exactly sure of his baseline creatinine, but notes that his GFR last was somewhere around 23.  He is followed by nephrology but does not want to be on dialysis. - creatinine 2.72 today - gentle hydration in preparation for cath, ctm closely  NAGMA: likely 2/2 NS administration and CKD.  Will switch to LR.   Anemia of chronic disease: stable, follow - Continue to monitor  BPH - Continue Cardura - had foley placed with retention, foley now removed  Dyslipidemia - Continue home medication - zetia added - consider PCSK9 as outpatient  OSA on CPAP - RT to supply  CPAP at night  NAGMA: mild, continue to monitor   Thrombocytopenia: mild, follow   Elevated AST: mild, follow   Constipation - miralax BID  DVT prophylaxis: heparin gtt Code Status: full  Family Communication: daugthers at bedside Disposition Plan: pending cardiology w/u   Consultants:   cardiology  Procedures:  Study Conclusions  - Left ventricle: The cavity size was  normal. Wall thickness was   increased in Nathan Wolfe pattern of mild LVH. Features are consistent with   Nathan Wolfe pseudonormal left ventricular filling pattern, with concomitant   abnormal relaxation and increased filling pressure (grade 2   diastolic dysfunction). Doppler parameters are consistent with   high ventricular filling pressure. - Aortic valve: Transvalvular velocity was within the normal range.   There was no stenosis. There was no regurgitation. - Mitral valve: Transvalvular velocity was within the normal range.   There was no evidence for stenosis. There was trivial   regurgitation. - Left atrium: The atrium was severely dilated. - Right ventricle: The cavity size was normal. Wall thickness was   normal. Systolic function was normal. - Atrial septum: No defect or patent foramen ovale was identified   by color flow Doppler. - Tricuspid valve: There was mild regurgitation. - Pulmonary arteries: Systolic pressure was moderately to severely   increased. PA peak pressure: 59 mm Hg (S).   Antimicrobials:  none    Subjective: Coughing this morning.  Seems Nathan Wolfe bit worse. No CP since 2 nights ago. Anxious, asking for more xanax.   Objective: Vitals:   05/04/17 0700 05/04/17 0800 05/04/17 0806 05/04/17 1208  BP: (!) 155/54 (!) 141/57  (!) 139/56  Pulse:      Resp: (!) 21 (!) 21    Temp:   98.4 F (36.9 C) 98.3 F (36.8 C)  TempSrc:   Oral Oral  SpO2:      Weight:      Height:        Intake/Output Summary (Last 24 hours) at 05/04/2017 1400 Last data filed at 05/04/2017 0800 Gross per 24 hour  Intake 2138.91 ml  Output 1036 ml  Net 1102.91 ml   Filed Weights   05/02/17 0231 05/03/17 0500 05/04/17 0456  Weight: 118.8 kg (261 lb 14.5 oz) 124 kg (273 lb 5.9 oz) 124.1 kg (273 lb 9.5 oz)    Examination:  General: No acute distress. Cardiovascular: Heart sounds show Nathan Wolfe regular rate, and rhythm. No gallops or rubs. No murmurs. No JVD. Lungs: Good air movement, some scattered  wheezing on anterior exam on R Abdomen: Soft, nontender, nondistended with normal active bowel sounds. No masses. No hepatosplenomegaly. Neurological: Alert and oriented 3. Moves all extremities 4. Cranial nerves II through XII grossly intact. Skin: Warm and dry. No rashes or lesions. Extremities: No clubbing or cyanosis. No edema. Nathan Wolfe Kitchen Psychiatric: Mood and affect are normal. Insight and judgment are appropirat.    Data Reviewed: I have personally reviewed following labs and imaging studies  CBC: Recent Labs  Lab 05/01/17 0004 05/01/17 0006 05/02/17 0642 05/03/17 0255 05/04/17 0303  WBC 9.6  --  9.0 7.9 6.9  NEUTROABS 7.1  --   --   --   --   HGB 12.7* 12.6* 11.6* 10.7* 11.4*  HCT 37.2* 37.0* 35.0* 33.1* 34.4*  MCV 97.6  --  98.9 98.8 98.6  PLT 218  --  172 161 962*   Basic Metabolic Panel: Recent Labs  Lab 05/01/17 0006 05/01/17 0316 05/01/17 0803 05/02/17 2297 05/02/17 1102 05/03/17 0255 05/04/17 0303  NA 143 141  --  138  --  133* 135  K 3.9 3.4*  --  4.2  --  4.1 4.8  CL 109 112*  --  108  --  104 107  CO2  --  20*  --  17*  --  18* 16*  GLUCOSE 167* 116*  --  128*  --  130* 105*  BUN 60* 65*  --  54*  --  57* 54*  CREATININE 3.40* 2.84*  --  2.97*  --  2.94* 2.72*  CALCIUM  --  8.5*  --  9.2  --  8.4* 8.8*  MG  --   --  2.1  --  1.9 2.0 2.1   GFR: Estimated Creatinine Clearance: 30.6 mL/min (Nathan Wolfe) (by C-G formula based on SCr of 2.72 mg/dL (H)). Liver Function Tests: Recent Labs  Lab 05/01/17 0316 05/04/17 0303  AST 22 42*  ALT 23 29  ALKPHOS 32* 24*  BILITOT 0.5 0.9  PROT 6.0* 6.5  ALBUMIN 3.3* 3.1*   No results for input(s): LIPASE, AMYLASE in the last 168 hours. No results for input(s): AMMONIA in the last 168 hours. Coagulation Profile: Recent Labs  Lab 05/01/17 0157  INR 1.18   Cardiac Enzymes: Recent Labs  Lab 05/02/17 0642 05/02/17 1102 05/02/17 1852 05/03/17 0255 05/04/17 0303  TROPONINI 3.21* 2.80* 2.63* 2.97* 1.95*   BNP (last  3 results) No results for input(s): PROBNP in the last 8760 hours. HbA1C: No results for input(s): HGBA1C in the last 72 hours. CBG: Recent Labs  Lab 05/02/17 0755  GLUCAP 133*   Lipid Profile: No results for input(s): CHOL, HDL, LDLCALC, TRIG, CHOLHDL, LDLDIRECT in the last 72 hours. Thyroid Function Tests: No results for input(s): TSH, T4TOTAL, FREET4, T3FREE, THYROIDAB in the last 72 hours. Anemia Panel: No results for input(s): VITAMINB12, FOLATE, FERRITIN, TIBC, IRON, RETICCTPCT in the last 72 hours. Sepsis Labs: No results for input(s): PROCALCITON, LATICACIDVEN in the last 168 hours.  Recent Results (from the past 240 hour(s))  Culture, blood (routine x 2)     Status: None (Preliminary result)   Collection Time: 05/02/17  1:06 AM  Result Value Ref Range Status   Specimen Description BLOOD RIGHT HAND  Final   Special Requests   Final    BOTTLES DRAWN AEROBIC AND ANAEROBIC Blood Culture results may not be optimal due to an inadequate volume of blood received in culture bottles   Culture   Final    NO GROWTH 1 DAY Performed at [facility], [place] [address], [place], [place] [address]    Report Status PENDING  Incomplete  Culture, blood (routine x 2)     Status: None (Preliminary result)   Collection Time: 05/02/17  1:07 AM  Result Value Ref Range Status   Specimen Description BLOOD RIGHT HAND  Final   Special Requests   Final    BOTTLES DRAWN AEROBIC AND ANAEROBIC Blood Culture results may not be optimal due to an inadequate volume of blood received in culture bottles   Culture   Final    NO GROWTH 1 DAY Performed at [facility], [place] [address], [place], [place] [address]    Report Status PENDING  Incomplete  MRSA PCR Screening     Status: None   Collection Time: 05/02/17  9:45 AM  Result Value Ref Range Status   MRSA by PCR NEGATIVE NEGATIVE Final    Comment:        The GeneXpert  MRSA Assay (FDA approved for NASAL specimens only), is one component of  Nathan Wolfe comprehensive MRSA colonization surveillance program. It is not intended to diagnose MRSA infection nor to guide or monitor treatment for MRSA infections. Performed at [facility], [place] [address] [place], [place] [address]          Radiology Studies: No results found.      Scheduled Meds: . allopurinol  100 mg Oral Daily  . amLODipine  10 mg Oral Daily  . aspirin EC  81 mg Oral QHS  . brimonidine  1 drop Both Eyes QODAY  . chlorhexidine  15 mL Mouth Rinse BID  . clopidogrel  75 mg Oral Daily  . docusate sodium  100 mg Oral Daily  . doxazosin  4 mg Oral Daily  . ezetimibe  10 mg Oral Daily  . fenofibrate  160 mg Oral Daily  . FLUoxetine  20 mg Oral Daily  . hydrALAZINE  75 mg Oral BID  . isosorbide mononitrate  120 mg Oral Daily  . mouth rinse  15 mL Mouth Rinse q12n4p  . Melatonin  3 mg Oral QHS  . metoprolol tartrate  50 mg Oral BID  . oseltamivir  30 mg Oral Daily  . polyethylene glycol  17 g Oral BID  . sodium chloride flush  3 mL Intravenous Q12H   Continuous Infusions: . sodium chloride 75 mL/hr at 05/03/17 1900  . sodium chloride    . sodium chloride    . azithromycin 500 mg (05/03/17 2341)  . cefTRIAXone (ROCEPHIN)  IV Stopped (05/03/17 2319)  . heparin 1,750 Units/hr (05/04/17 0930)     LOS: 3 days    Time spent: over 30 min    Nathan Helper, MD [facility] Pager [phone]   If 7PM-7AM, please contact night-coverage [email or web address] Password [email or web address] 05/04/2017, 2:00 PM

## 2017-05-04 NOTE — Progress Notes (Addendum)
Pharmacy Antibiotic Note  Nathan Wolfe is a 72 y.o. male admitted on 04/30/2017 with pneumonia.  Pharmacy has been consulted for vancomycin dosing.  On concurrent cefepime, azithromycin, and Tamiflu (influenza A positive). Continues to have fever (100.4 this afternoon). WBC WNL.  Scr is elevated (CKD w/o HD) at 2.72 (normCrCl 24 mL/min). MRSA PCR was negative on 3/16 - has strong negative predictive value for respiratory infections.   Plan: Vancomycin 2 g IV once Vancomycin 1750 mg IV every 48 hours.  Goal trough 15-20 mcg/mL. Monitor renal function, clinical pic, cx results, and VT as needed  Follow up for opportunities to de-escalate antibiotics as appropriate  Height: 5\' 7"  (170.2 cm) Weight: 273 lb 9.5 oz (124.1 kg) IBW/kg (Calculated) : 66.1  Temp (24hrs), Avg:98.9 F (37.2 C), Min:98.3 F (36.8 C), Max:100.4 F (38 C)  Recent Labs  Lab 05/01/17 0004 05/01/17 0006 05/01/17 0316 05/02/17 0642 05/03/17 0255 05/04/17 0303  WBC 9.6  --   --  9.0 7.9 6.9  CREATININE  --  3.40* 2.84* 2.97* 2.94* 2.72*    Estimated Creatinine Clearance: 30.6 mL/min (A) (by C-G formula based on SCr of 2.72 mg/dL (H)).    Allergies  Allergen Reactions  . Statins Other (See Comments)    Full body muscle stuffiness/pain    Antimicrobials this admission: Ceftriaxone 3/16 >> 3/17 Cefepime 3/18 >>  Vancomycin 3/18>> Tamiflu 3/16>>  Dose adjustments this admission: N/A  Microbiology results: 3/16 BCx: NGTD 3/16 MRSA PCR: neg 3/18 BCx: sent 3/18 Sputum: sent  3/18 MRSA PCR: sent  Thank you for allowing pharmacy to be a part of this patient's care.  Doylene Canard, PharmD Clinical Pharmacist  Pager: [phone] Phone: [phone] 05/04/2017 7:27 PM

## 2017-05-04 NOTE — Progress Notes (Signed)
PHARMACY NOTE:  ANTIMICROBIAL RENAL DOSAGE ADJUSTMENT  Current antimicrobial regimen includes a mismatch between antimicrobial dosage and estimated renal function.  As per policy approved by the Pharmacy & Therapeutics and Medical Executive Committees, the antimicrobial dosage will be adjusted accordingly.  Current antimicrobial dosage:  Cefepime 1gm IV q8h  Indication: PNA  Renal Function:  Estimated Creatinine Clearance: 30.6 mL/min (A) (by C-G formula based on SCr of 2.72 mg/dL (H)).    Antimicrobial dosage has been changed to:  Cefepime 2gm IV q24h    Thank you for allowing pharmacy to be a part of this patient's care.  Hildred Laser, PharmD Clinical Pharmacist 05/04/2017 7:03 PM   e

## 2017-05-05 ENCOUNTER — Inpatient Hospital Stay (HOSPITAL_COMMUNITY): Payer: Medicare Other

## 2017-05-05 DIAGNOSIS — J9601 Acute respiratory failure with hypoxia: Secondary | ICD-10-CM

## 2017-05-05 LAB — CBC
HCT: 29.9 % — ABNORMAL LOW (ref 39.0–52.0)
Hemoglobin: 10 g/dL — ABNORMAL LOW (ref 13.0–17.0)
MCH: 32.9 pg (ref 26.0–34.0)
MCHC: 33.4 g/dL (ref 30.0–36.0)
MCV: 98.4 fL (ref 78.0–100.0)
PLATELETS: 136 10*3/uL — AB (ref 150–400)
RBC: 3.04 MIL/uL — ABNORMAL LOW (ref 4.22–5.81)
RDW: 14.5 % (ref 11.5–15.5)
WBC: 6.2 10*3/uL (ref 4.0–10.5)

## 2017-05-05 LAB — COMPREHENSIVE METABOLIC PANEL
ALT: 33 U/L (ref 17–63)
ANION GAP: 11 (ref 5–15)
AST: 36 U/L (ref 15–41)
Albumin: 2.7 g/dL — ABNORMAL LOW (ref 3.5–5.0)
Alkaline Phosphatase: 21 U/L — ABNORMAL LOW (ref 38–126)
BILIRUBIN TOTAL: 0.9 mg/dL (ref 0.3–1.2)
BUN: 55 mg/dL — ABNORMAL HIGH (ref 6–20)
CO2: 19 mmol/L — ABNORMAL LOW (ref 22–32)
Calcium: 8.6 mg/dL — ABNORMAL LOW (ref 8.9–10.3)
Chloride: 103 mmol/L (ref 101–111)
Creatinine, Ser: 2.77 mg/dL — ABNORMAL HIGH (ref 0.61–1.24)
GFR calc Af Amer: 25 mL/min — ABNORMAL LOW (ref >60)
GFR, EST NON AFRICAN AMERICAN: 21 mL/min — AB (ref >60)
Glucose, Bld: 118 mg/dL — ABNORMAL HIGH (ref 65–99)
Potassium: 4.3 mmol/L (ref 3.5–5.1)
Sodium: 133 mmol/L — ABNORMAL LOW (ref 135–145)
TOTAL PROTEIN: 6 g/dL — AB (ref 6.5–8.1)

## 2017-05-05 LAB — ECHOCARDIOGRAM COMPLETE
Height: 67 in
Weight: 4373.93 oz

## 2017-05-05 LAB — PROCALCITONIN
PROCALCITONIN: 1.13 ng/mL
Procalcitonin: 17.54 ng/mL

## 2017-05-05 LAB — HEPARIN LEVEL (UNFRACTIONATED): Heparin Unfractionated: 0.3 IU/mL (ref 0.30–0.70)

## 2017-05-05 LAB — MAGNESIUM: Magnesium: 2.4 mg/dL (ref 1.7–2.4)

## 2017-05-05 MED ORDER — MORPHINE SULFATE (PF) 2 MG/ML IV SOLN: 2.0000 mg | Freq: Once | INTRAVENOUS | Status: DC

## 2017-05-05 NOTE — Plan of Care (Signed)
Patient has periods of anxiety brought on by shortness of breath with activity. Anxiety exacerbates shortness of breath and reports of chest pain. Patient anxiety increases during visitation by daughter, but efforts have been made to educate both patient and daughter of importance of remaining calm and relation of anxiety with symptomology.

## 2017-05-05 NOTE — Progress Notes (Signed)
Paged on-call cardiologist at 2130 after patient complaining of chest pain without relief from 2 mg morphine. Ordered to restart nitro gtt. Started at 5 mcg/min with relief of pain. EKG completed and placed on chart. Will continue to monitor.

## 2017-05-05 NOTE — Plan of Care (Signed)
  Progressing Education: Knowledge of General Education information will improve 05/05/2017 2142 - Progressing by Terrill Mohr, RN Health Behavior/Discharge Planning: Ability to manage health-related needs will improve 05/05/2017 2142 - Progressing by Slusher, Georganna Skeans, RN Clinical Measurements: Ability to maintain clinical measurements within normal limits will improve 05/05/2017 2142 - Progressing by Terrill Mohr, RN Will remain free from infection 05/05/2017 2142 - Progressing by Terrill Mohr, RN Diagnostic test results will improve 05/05/2017 2142 - Progressing by Terrill Mohr, RN Respiratory complications will improve 05/05/2017 2142 - Progressing by Terrill Mohr, RN Cardiovascular complication will be avoided 05/05/2017 2142 - Progressing by Terrill Mohr, RN Activity: Risk for activity intolerance will decrease 05/05/2017 2142 - Progressing by Terrill Mohr, RN Nutrition: Adequate nutrition will be maintained 05/05/2017 2142 - Progressing by Terrill Mohr, RN Coping: Level of anxiety will decrease 05/05/2017 2142 - Progressing by Terrill Mohr, RN Note Patient beginning to understand relaxation techniques such as slow, deep breathing. Elimination: Will not experience complications related to bowel motility 05/05/2017 2142 - Progressing by Terrill Mohr, RN Pain Managment: General experience of comfort will improve 05/05/2017 2142 - Progressing by Terrill Mohr, RN Skin Integrity: Risk for impaired skin integrity will decrease 05/05/2017 2142 - Progressing by Terrill Mohr, RN Education: Understanding of cardiac disease, CV risk reduction, and recovery process will improve 05/05/2017 2142 - Progressing by Terrill Mohr, RN Understanding of medication regimen will improve 05/05/2017 2142 - Progressing by Terrill Mohr, RN Activity: Ability to tolerate increased activity will improve 05/05/2017 2142 - Progressing by Terrill Mohr,  RN Cardiac: Ability to achieve and maintain adequate cardiopulmonary perfusion will improve 05/05/2017 2142 - Progressing by Terrill Mohr, RN Vascular access site(s) Level 0-1 will be maintained 05/05/2017 2142 - Progressing by Terrill Mohr, RN Health Behavior/Discharge Planning: Ability to safely manage health-related needs after discharge will improve 05/05/2017 2142 - Progressing by Slusher, Georganna Skeans, RN

## 2017-05-05 NOTE — Plan of Care (Signed)
Patient is unable to tolerate any activity, he immediately becomes short of breath. When this occurs he has an anxiety attack which brings on chest pain, which then causes him to panic and only exacerbates the situation. When he is relaxed and family is not present he is able to rest quietly and has no pain. RN has made many attempts to explain that he needs to stay calm and relax so that he is not expending so much energy but he is not able to help in this matter. Education will continue.

## 2017-05-05 NOTE — Progress Notes (Addendum)
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 Patient reports symptoms did not feel like acid reflux. Reports pain radiating into his jaw similar to previous heart attacks. Patient reports taking approximately 4 nitros with just temporary relief of symptoms before return of pain symptoms. In the ER, pt was noted to be flu positive, flat troponin, admitted for further management.  Today, pt reported feeling SOB, but denied any current CP. No fever noted, cough improving as per pt. Noted to be anxious overall.  Assessment/Plan: Active Problems:   Unstable angina (HCC)   Anemia due to chronic kidney disease   Essential hypertension   OSA on CPAP   Demand ischemia (HCC)   Influenza A   Acute respiratory failure with hypoxia (HCC)  Influenza A positive/?CAP Remains afebrile, last temp on 3/18, no luekocytosis BC X 2 NGTD Procalcitonin downtrending Sputum cx with few Gram + cocci in clusters, few gram - rods. MRSA PCR negative CXR: with resolving pulm edema Continue tamiflu, started on 3/15 Continue cefepime, azithromycin, will d/c vanc as MRSA PCR neg+CKD  ?Unstable angina  NSTEMI Vs demand ischemia Extensive cardiac hx as mentioned above Trop downtrending, 2.97-->1.95 EKG with t wave changes  ECHO with EF of 50-55%, Grade 2 DD, no RWMA Cardiology on board: D/C nitro drip, continue with NTG SL. Will re-evaluate for cath  Continue Heparin drip, ASA, plavix, imdur, metoprolol Monitor closely  HFpEF Exacerbation:  Repeat CXR with resolving pulm  edema ECHO as above S/P IVF in preparation for cath, currently stopped Continue diuresis with 40 mg lasix IV BID, monitor renal fxn closely  Essential hypertension Stable Continue metoprolol (50 BID), hydralazine (increased to 75 BID), amlodipine (increased to 10), imdur, lasix  CKD stage IV Unsure of baseline Creatinine of 3.4 on admission-->2.77 Follows with nephrology, not on HD Monitor closely as pt is on lasix  Anemia of chronic disease Stable, monitor closely  BPH Continue Cardura  Dyslipidemia Continue fenofibrate, started zetia  OSA on CPAP CPAP at night  Anxiety Continue xanax prn Psych consult placed     Code Status: Full  Family Communication: Son in law at bedside  Disposition Plan: Once work-up complete   Consultants:  Cardiology  Procedures:  None  Antimicrobials:  Cefepime  Azithromycin  Tamiflu   DVT prophylaxis:  Heparin drip   Objective: Vitals:   05/05/17 1550 05/05/17 1600 05/05/17 1700 05/05/17 1800  BP:  (!) 122/49 (!) 124/41 (!) 124/45  Pulse:      Resp:  17 (!) 26 (!) 37  Temp: 98.5 F (36.9 C)     TempSrc: Oral     SpO2:      Weight:      Height:        Intake/Output Summary (Last 24 hours) at 05/05/2017 1828 Last data filed at 05/05/2017 1800 Gross per 24 hour  Intake 1770.27 ml  Output 2925 ml  Net -1154.73 ml   Filed Weights   05/03/17 0500 05/04/17 0456 05/05/17 0500  Weight: 124 kg (273 lb 5.9 oz) 124.1 kg (273 lb 9.5 oz) 124.6 kg (274 lb 11.1 oz)    Exam:   General:  Mild distress  Cardiovascular: S1, S2 present  Respiratory: Mild wheezing noted bilaterally  Abdomen: Soft, NT, ND, BS present  Musculoskeletal: No pedal edema  Skin: Normal  Psychiatry: Anxious    Data Reviewed: CBC: Recent Labs  Lab 05/01/17 0004 05/01/17 0006 05/02/17 1610 05/03/17 0255 05/04/17 0303 05/05/17 0245  WBC 9.6  --  9.0 7.9 6.9 6.2  NEUTROABS 7.1  --   --   --   --   --   HGB 12.7* 12.6*  11.6* 10.7* 11.4* 10.0*  HCT 37.2* 37.0* 35.0* 33.1* 34.4* 29.9*  MCV 97.6  --  98.9 98.8 98.6 98.4  PLT 218  --  172 161 144* 960*   Basic Metabolic Panel: Recent Labs  Lab 05/01/17 0316 05/01/17 0803 05/02/17 0642 05/02/17 1102 05/03/17 0255 05/04/17 0303 05/05/17 0245  NA 141  --  138  --  133* 135 133*  K 3.4*  --  4.2  --  4.1 4.8 4.3  CL 112*  --  108  --  104 107 103  CO2 20*  --  17*  --  18* 16* 19*  GLUCOSE 116*  --  128*  --  130* 105* 118*  BUN 65*  --  54*  --  57* 54* 55*  CREATININE 2.84*  --  2.97*  --  2.94* 2.72* 2.77*  CALCIUM 8.5*  --  9.2  --  8.4* 8.8* 8.6*  MG  --  2.1  --  1.9 2.0 2.1 2.4   GFR: Estimated Creatinine Clearance: 30.1 mL/min (A) (by C-G formula based on SCr of 2.77 mg/dL (H)). Liver Function Tests: Recent Labs  Lab 05/01/17 0316 05/04/17 0303 05/05/17 0245  AST 22 42* 36  ALT 23 29 33  ALKPHOS 32* 24* 21*  BILITOT 0.5 0.9 0.9  PROT 6.0* 6.5 6.0*  ALBUMIN 3.3* 3.1* 2.7*   No results for input(s): LIPASE, AMYLASE in the last 168 hours. No results for input(s): AMMONIA in the last 168 hours. Coagulation Profile: Recent Labs  Lab 05/01/17 0157  INR 1.18   Cardiac Enzymes: Recent Labs  Lab 05/02/17 0642 05/02/17 1102 05/02/17 1852 05/03/17 0255 05/04/17 0303  TROPONINI 3.21* 2.80* 2.63* 2.97* 1.95*   BNP (last 3 results) No results for input(s): PROBNP in the last 8760 hours. HbA1C: No results for input(s): HGBA1C in the last 72 hours. CBG: Recent Labs  Lab 05/02/17 0755  GLUCAP 133*   Lipid Profile: No results for input(s): CHOL, HDL, LDLCALC, TRIG, CHOLHDL, LDLDIRECT in the last 72 hours. Thyroid Function Tests: No results for input(s): TSH, T4TOTAL, FREET4, T3FREE, THYROIDAB in the last 72 hours. Anemia Panel: No results for input(s): VITAMINB12, FOLATE, FERRITIN, TIBC, IRON, RETICCTPCT in the last 72 hours. Urine analysis: No results found for: COLORURINE, APPEARANCEUR, LABSPEC, PHURINE, GLUCOSEU, HGBUR,  BILIRUBINUR, KETONESUR, PROTEINUR, UROBILINOGEN, NITRITE, LEUKOCYTESUR Sepsis Labs: @LABRCNTIP (procalcitonin:4,lacticidven:4)  ) Recent Results (from the past 240 hour(s))  Culture, blood (routine x 2)     Status: None (Preliminary result)   Collection Time: 05/02/17  1:06 AM  Result Value Ref Range Status   Specimen Description BLOOD RIGHT HAND  Final   Special Requests   Final    BOTTLES DRAWN AEROBIC AND ANAEROBIC Blood Culture results may not be optimal due to an inadequate volume of blood received in culture bottles   Culture   Final    NO GROWTH 3 DAYS Performed at [facility], [place] [address], [place], [place] [address]    Report Status PENDING  Incomplete  Culture, blood (routine x 2)     Status: None (Preliminary result)   Collection Time: 05/02/17  1:07 AM  Result Value Ref Range Status   Specimen Description BLOOD RIGHT HAND  Final   Special Requests   Final    BOTTLES DRAWN AEROBIC AND ANAEROBIC Blood Culture results may not be optimal due to an inadequate volume of blood received in culture bottles   Culture   Final    NO GROWTH 3 DAYS Performed at [facility], [place] [address], [place], [place] [address]    Report Status PENDING  Incomplete  MRSA PCR Screening     Status: None   Collection Time: 05/02/17  9:45 AM  Result Value Ref Range Status   MRSA by PCR NEGATIVE NEGATIVE Final    Comment:        The GeneXpert MRSA Assay (FDA approved for NASAL specimens only), is one component of a comprehensive MRSA colonization surveillance program. It is not intended to diagnose MRSA infection nor to guide or monitor treatment for MRSA infections. Performed at [facility], [place] [address] [place], [place] [address]   MRSA PCR Screening     Status: None   Collection Time: 05/04/17  5:15 PM  Result Value Ref Range Status   MRSA by PCR NEGATIVE NEGATIVE Final    Comment:        The GeneXpert MRSA Assay (FDA approved for NASAL specimens only), is  one component of a comprehensive MRSA colonization surveillance program. It is not intended to diagnose MRSA infection nor to guide or monitor treatment for MRSA infections. Performed at [facility], [place] [address] [place], [place] [address]   Culture, expectorated sputum-assessment     Status: None   Collection Time: 05/04/17  6:06 PM  Result Value Ref Range Status   Specimen Description EXPECTORATED SPUTUM  Final   Special Requests NONE  Final   Sputum evaluation   Final    THIS SPECIMEN IS ACCEPTABLE FOR SPUTUM CULTURE Performed at [facility], [place] [address] [place], [place] [address]    Report Status 05/04/2017 FINAL  Final  Culture, respiratory (NON-Expectorated)     Status: None (Preliminary result)   Collection Time: 05/04/17  6:06 PM  Result Value Ref Range Status   Specimen Description EXPECTORATED SPUTUM  Final   Special Requests NONE Reflexed from [id number]  Final   Gram Stain   Final    FEW WBC PRESENT, PREDOMINANTLY PMN FEW SQUAMOUS EPITHELIAL CELLS PRESENT FEW GRAM POSITIVE COCCI IN CLUSTERS FEW GRAM NEGATIVE RODS RARE GRAM POSITIVE RODS    Culture   Final    CULTURE REINCUBATED FOR BETTER GROWTH Performed at [facility], [place] [address], [place], [place] [address]    Report Status PENDING  Incomplete  Culture, blood (routine x 2) Call MD if unable to obtain prior to antibiotics being given     Status: None (Preliminary result)   Collection Time: 05/04/17  8:30 PM  Result Value Ref Range Status   Specimen Description BLOOD RIGHT HAND  Final   Special Requests Blood Culture adequate volume BLOOD AEROBIC BOTTLE  Final   Culture   Final    NO GROWTH < 12 HOURS Performed at [facility], [place] [address] [place], [place] [address]    Report Status PENDING  Incomplete  Culture, blood (routine x 2) Call MD if unable to obtain prior to antibiotics being given  Status: None (Preliminary result)   Collection Time: 05/04/17  8:35 PM    Result Value Ref Range Status   Specimen Description BLOOD RIGHT HAND  Final   Special Requests BLOOD AEROBIC BOTTLE Blood Culture adequate volume  Final   Culture   Final    NO GROWTH < 12 HOURS Performed at [facility], [place] [address], [place], [place] [address]    Report Status PENDING  Incomplete      Studies: Dg Chest Port 1 View  Result Date: 05/05/2017 CLINICAL DATA:  Hypoxia. EXAM: PORTABLE CHEST 1 VIEW COMPARISON:  05/04/2017.  05/01/2017. FINDINGS: Prior CABG. Stable cardiomegaly. Slight improvement bibasilar interstitial prominence suggesting slight clearing of pulmonary interstitial edema. Small left pleural effusion. No pneumothorax. Degenerative changes scoliosis thoracic spine. IMPRESSION: Prior CABG. Stable cardiomegaly. Persistent but slightly improved bibasilar interstitial prominence suggesting slight clearing of pulmonary interstitial edema. Small left pleural effusion. Electronically Signed   By: Marcello Moores  Register   On: 05/05/2017 07:36    Scheduled Meds: . allopurinol  100 mg Oral Daily  . amLODipine  10 mg Oral Daily  . aspirin EC  81 mg Oral QHS  . brimonidine  1 drop Both Eyes QODAY  . chlorhexidine  15 mL Mouth Rinse BID  . clopidogrel  75 mg Oral Daily  . docusate sodium  100 mg Oral Daily  . doxazosin  4 mg Oral Daily  . ezetimibe  10 mg Oral Daily  . fenofibrate  160 mg Oral Daily  . FLUoxetine  20 mg Oral Daily  . furosemide  40 mg Intravenous BID  . hydrALAZINE  75 mg Oral BID  . ipratropium-albuterol  3 mL Nebulization Q4H  . isosorbide mononitrate  120 mg Oral Daily  . mouth rinse  15 mL Mouth Rinse q12n4p  . Melatonin  3 mg Oral QHS  . metoprolol tartrate  50 mg Oral BID  .  morphine injection  2 mg Intravenous Once  . oseltamivir  30 mg Oral Daily  . polyethylene glycol  17 g Oral BID  . sodium chloride flush  3 mL Intravenous Q12H    Continuous Infusions: . sodium chloride    . sodium chloride    . azithromycin Stopped (05/05/17  0029)  . ceFEPime (MAXIPIME) IV Stopped (05/04/17 2113)  . heparin 1,800 Units/hr (05/05/17 1437)  . [START ON 05/06/2017] vancomycin       LOS: 4 days     Alma Friendly, MD [facility]   If 7PM-7AM, please contact night-coverage [email or web address] Password [facility] 05/05/2017, 6:28 PM

## 2017-05-05 NOTE — Progress Notes (Signed)
Progress Note  Patient Name: Nathan Wolfe Date of Encounter: 05/05/2017  Primary Cardiologist: Quay Burow, MD   Subjective   Patient described one episode of chest pain the evening before. He stated that it was similar to his previous chest pain but occurred after significant movement and resolved with morphine and IV nitro low dose. He stated that his SOB was somewhat better, but not significantly so.   Inpatient Medications    Scheduled Meds: . allopurinol  100 mg Oral Daily  . amLODipine  10 mg Oral Daily  . aspirin EC  81 mg Oral QHS  . brimonidine  1 drop Both Eyes QODAY  . chlorhexidine  15 mL Mouth Rinse BID  . clopidogrel  75 mg Oral Daily  . docusate sodium  100 mg Oral Daily  . doxazosin  4 mg Oral Daily  . ezetimibe  10 mg Oral Daily  . fenofibrate  160 mg Oral Daily  . FLUoxetine  20 mg Oral Daily  . furosemide  40 mg Intravenous BID  . hydrALAZINE  75 mg Oral BID  . ipratropium-albuterol  3 mL Nebulization Q4H  . isosorbide mononitrate  120 mg Oral Daily  . mouth rinse  15 mL Mouth Rinse q12n4p  . Melatonin  3 mg Oral QHS  . metoprolol tartrate  50 mg Oral BID  . oseltamivir  30 mg Oral Daily  . polyethylene glycol  17 g Oral BID  . sodium chloride flush  3 mL Intravenous Q12H   Continuous Infusions: . sodium chloride    . sodium chloride    . azithromycin Stopped (05/05/17 0029)  . ceFEPime (MAXIPIME) IV Stopped (05/04/17 2113)  . heparin 1,750 Units/hr (05/05/17 0100)  . nitroGLYCERIN 5 mcg/min (05/04/17 2138)  . [START ON 05/06/2017] vancomycin     PRN Meds: sodium chloride, acetaminophen, ALPRAZolam, benzonatate, diphenhydrAMINE, gi cocktail, hydrALAZINE, ipratropium-albuterol, morphine injection, nitroGLYCERIN, ondansetron (ZOFRAN) IV, sodium chloride flush   Vital Signs    Vitals:   05/05/17 0400 05/05/17 0500 05/05/17 0600 05/05/17 0616  BP: (!) 132/55   (!) 146/54  Pulse: 60     Resp: (!) 23 (!) 23 (!) 21 15  Temp:      TempSrc:       SpO2: 95%     Weight:  124.6 kg (274 lb 11.1 oz)    Height:        Intake/Output Summary (Last 24 hours) at 05/05/2017 0642 Last data filed at 05/05/2017 0600 Gross per 24 hour  Intake 2474.18 ml  Output 2775 ml  Net -300.82 ml   Filed Weights   05/03/17 0500 05/04/17 0456 05/05/17 0500  Weight: 124 kg (273 lb 5.9 oz) 124.1 kg (273 lb 9.5 oz) 124.6 kg (274 lb 11.1 oz)    Telemetry    NSR - Personally Reviewed  ECG    Normal sinus rhythm with diffuse T-wave flattening - Personally Reviewed  Physical Exam   GEN: No acute distress.   Neck: No JVD Cardiac: RRR, no murmurs, rubs, or gallops.  Respiratory: Mild wheezing bilaterally. GI: Soft, nontender, mildly-distended  MS: No prominent edema; No deformity. Neuro:  Nonfocal  Psych: Normal affect   Labs    Chemistry Recent Labs  Lab 05/01/17 0316  05/03/17 0255 05/04/17 0303 05/05/17 0245  NA 141   < > 133* 135 133*  K 3.4*   < > 4.1 4.8 4.3  CL 112*   < > 104 107 103  CO2 20*   < >  18* 16* 19*  GLUCOSE 116*   < > 130* 105* 118*  BUN 65*   < > 57* 54* 55*  CREATININE 2.84*   < > 2.94* 2.72* 2.77*  CALCIUM 8.5*   < > 8.4* 8.8* 8.6*  PROT 6.0*  --   --  6.5 6.0*  ALBUMIN 3.3*  --   --  3.1* 2.7*  AST 22  --   --  42* 36  ALT 23  --   --  29 33  ALKPHOS 32*  --   --  24* 21*  BILITOT 0.5  --   --  0.9 0.9  GFRNONAA 21*   < > 20* 22* 21*  GFRAA 24*   < > 23* 25* 25*  ANIONGAP 9   < > 11 12 11    < > = values in this interval not displayed.    Hematology Recent Labs  Lab 05/03/17 0255 05/04/17 0303 05/05/17 0245  WBC 7.9 6.9 6.2  RBC 3.35* 3.49* 3.04*  HGB 10.7* 11.4* 10.0*  HCT 33.1* 34.4* 29.9*  MCV 98.8 98.6 98.4  MCH 31.9 32.7 32.9  MCHC 32.3 33.1 33.4  RDW 14.5 14.4 14.5  PLT 161 144* 136*   Cardiac Enzymes Recent Labs  Lab 05/02/17 1102 05/02/17 1852 05/03/17 0255 05/04/17 0303  TROPONINI 2.80* 2.63* 2.97* 1.95*    Recent Labs  Lab 05/01/17 0004  TROPIPOC 0.00    BNP Recent  Labs  Lab 05/01/17 0316  BNP 366.5*     DDimer No results for input(s): DDIMER in the last 168 hours.   Radiology    Dg Chest Port 1 View  Result Date: 05/04/2017 CLINICAL DATA:  Cough. EXAM: PORTABLE CHEST 1 VIEW COMPARISON:  Single-view of the chest 0 3/15/scratch the single view of the chest and PA and lateral chest 05/01/2017. FINDINGS: There is cardiomegaly. The patient has new airspace disease in the mid and lower lung zones bilaterally. Aortic atherosclerosis is noted. Trace right pleural effusion is unchanged. IMPRESSION: New bilateral airspace disease could be due to edema or pneumonia. Cardiomegaly. Atherosclerosis. Electronically Signed   By: Inge Rise M.D.   On: 05/04/2017 14:16    Cardiac Studies   Echo3/17/19: Study Conclusions  - Left ventricle: The cavity size was normal. Wall thickness was   increased in a pattern of mild LVH. Systolic function was normal.   The estimated ejection fraction was in the range of 50% to 55%.   Mild hypokinesis of the anteroseptal myocardium. Features are   consistent with a pseudonormal left ventricular filling pattern,   with concomitant abnormal relaxation and increased filling   pressure (grade 2 diastolic dysfunction). Doppler parameters are   consistent with high ventricular filling pressure. - Aortic valve: Transvalvular velocity was within the normal range. There was no stenosis. There was no regurgitation. - Mitral valve: Transvalvular velocity was within the normal range. There was no evidence for stenosis. There was trivial regurgitation. - Left atrium: The atrium was severely dilated. - Right ventricle: The cavity size was normal. Wall thickness was normal. Systolic function was normal. - Atrial septum: No defect or patent foramen ovale was identified by color flow Doppler. - Tricuspid valve: There was mild regurgitation. - Pulmonary arteries: Systolic pressure was moderately to severely increased.  PA peak pressure: 59 mm Hg (S).  Cardiac cath in March 2016 as per chart review from outside facility.  Last catheterization was March 2016. "His EF was normal. He has a patent LIMA to the  LAD with an occluded LAD beyond LIMA insertion. He has a patent vein graft to the right status post stenting of the PDA with a 2.5 mm Synergy drug-eluting stent to the vein graft and a diseased ramus branch was with not intervened on."  Patient Profile     72 y.o. male with a past medical history notable for CAD status post three-vessel CABG in 2000 with redo bypass in 2011 with multiple PCI's, CKD stage IV.  He presented with unstable angina, cough was noted to have an elevated troponin without corresponding EKG changes.  Patient is being treated for influenza A vs CAP pneumonia with associated cough, dyspnea, and generalized malaise.   The patient continues to experience significant dyspnea which is to be expected given his influenza status.    Assessment & Plan    Unstable angina/STEMI: Patient presents with a complex cardiac history multiple CABGs PCI's in the past.  As per his primary cardiologist in [place], the patient was on amenable to additional intervention.  However cardiac cath is potentially warranted if the patient's chest pain continues.  At this time it is thought to have been exacerbated by his influenza.  Considering the patients overall status, it is advisable to refrain from a cardiac cath given the high probability of the patient requiring hemodialysis given his renal status following cardiac cath.  At this time we will continue to observe for improvement of his story status and monitor for ongoing chest pain.  The patient's cardiac echo was amended to demonstrate an EF of 50-55%. -Discontinuing nitro GTT -Continue NTG sublingual as needed for chest pain -Continue Imdur 120 mg -Last troponin I at 1.95 significantly decreased from his previous 2.97  -Continue metoprolol tartrate 50 mg twice  daily -Continue ASA and Plavix the patient's cardiac echo was amended to demonstrate an EF -Continue Xanax as needed for anxiety  Hypertension: Patient is mildly hypertensive at 130/60.  This is slightly improved with his previous pressures.  We will continue his current medication regimen. -Continue amlodipine 10 mg daily -Continue hydralazine 75 mg twice daily -Continue beta-blocker and doxazosin  Influenza/CAP: Continue Tamiflu.  Patient escalated to vancomycin and cefepime in addition to his azithromycin due to concern for worsening CXR.  Chest x-ray appears slightly improved from previous imaging and is consistent with possible viral pneumonitis. Given the patient's chronic kidney disease it may be advantageous to refrain from nephrotoxic agents as he may be need of a cardiac cath at a later date.  Please consider de-escalating treatment from vancomycin given two negative MRSA screenings.  There does not appear to be clear evidence of superimposed staph aureus infection in conjunction with the patient's influenza, given his stable clinical appearance, lack of leukocytosis, improving pro-Cal of 1.13 (on CTX and azithromycin), and stable vitals.  CKD 4: Patient creatinine is slightly worse at 2.77 from 2.7 to the previous day.  IV hydration was discontinued in favor of furosemide 40 mg IV twice daily. - Agree with gentle diuresis granted the patient's creatinine remained stable.  We advised her to discontinue diuresis for expansions and increase in creatinine. -Continue daily BMPs  For questions or updates, please contact [facility] Please consult [email or web address] for contact info under Cardiology/STEMI.   Please see attending note/attestation current assessment and plan.  Signed, Kathi Ludwig, MD  05/05/2017, 6:42 AM Pager [phone]

## 2017-05-05 NOTE — Progress Notes (Signed)
ANTICOAGULATION CONSULT NOTE  Pharmacy Consult: Heparin  Indication: chest pain/ACS  Allergies  Allergen Reactions  . Statins Other (See Comments)    Full body muscle stuffiness/pain    Patient Measurements: Weight = 118.8 kg Height = 67 inches  Vital Signs: Temp: 99.5 F (37.5 C) (03/19 0300) Temp Source: Oral (03/19 0300) BP: 146/54 (03/19 0616) Pulse Rate: 60 (03/19 0400)  Labs: Recent Labs    05/02/17 1852  05/03/17 0255 05/04/17 0303 05/05/17 0245  HGB  --    < > 10.7* 11.4* 10.0*  HCT  --   --  33.1* 34.4* 29.9*  PLT  --   --  161 144* 136*  HEPARINUNFRC  --   --  0.40 0.42 0.30  CREATININE  --   --  2.94* 2.72* 2.77*  TROPONINI 2.63*  --  2.97* 1.95*  --    < > = values in this interval not displayed.    Assessment: 64 YOM continues on IV heparin for ACS. Plan for cath postponed, evaluate 3/19 for CP in setting of influenza A.    Heparin level lower end of therapeutic at 0.30.  H/H low but stable, plts downtrending slightly, no bleeding observed.  Goal of Therapy:  Heparin level 0.3-0.7 units/ml Monitor platelets by anticoagulation protocol: Yes   Plan:  Increase heparin gtt to 1800 units/hr Monitor daily heparin level, CBC, s/s bleeding  Bertis Ruddy, PharmD Pharmacy Resident Pager #: [phone] 05/05/2017 7:19 AM

## 2017-05-06 ENCOUNTER — Inpatient Hospital Stay (HOSPITAL_COMMUNITY): Payer: Medicare Other

## 2017-05-06 DIAGNOSIS — R9389 Abnormal findings on diagnostic imaging of other specified body structures: Secondary | ICD-10-CM

## 2017-05-06 LAB — BASIC METABOLIC PANEL
Anion gap: 10 (ref 5–15)
BUN: 55 mg/dL — ABNORMAL HIGH (ref 6–20)
CO2: 22 mmol/L (ref 22–32)
Calcium: 8.6 mg/dL — ABNORMAL LOW (ref 8.9–10.3)
Chloride: 101 mmol/L (ref 101–111)
Creatinine, Ser: 2.85 mg/dL — ABNORMAL HIGH (ref 0.61–1.24)
GFR calc Af Amer: 24 mL/min — ABNORMAL LOW (ref >60)
GFR, EST NON AFRICAN AMERICAN: 20 mL/min — AB (ref >60)
GLUCOSE: 112 mg/dL — AB (ref 65–99)
POTASSIUM: 4.2 mmol/L (ref 3.5–5.1)
Sodium: 133 mmol/L — ABNORMAL LOW (ref 135–145)

## 2017-05-06 LAB — CBC
HCT: 27.8 % — ABNORMAL LOW (ref 39.0–52.0)
HEMOGLOBIN: 9.4 g/dL — AB (ref 13.0–17.0)
MCH: 32.6 pg (ref 26.0–34.0)
MCHC: 33.8 g/dL (ref 30.0–36.0)
MCV: 96.5 fL (ref 78.0–100.0)
Platelets: 152 10*3/uL (ref 150–400)
RBC: 2.88 MIL/uL — ABNORMAL LOW (ref 4.22–5.81)
RDW: 14 % (ref 11.5–15.5)
WBC: 6.5 10*3/uL (ref 4.0–10.5)

## 2017-05-06 LAB — PROCALCITONIN: PROCALCITONIN: 1.97 ng/mL

## 2017-05-06 LAB — HEPARIN LEVEL (UNFRACTIONATED): Heparin Unfractionated: 0.11 IU/mL — ABNORMAL LOW (ref 0.30–0.70)

## 2017-05-06 MED ORDER — CEFEPIME HCL 1 G IJ SOLR
1.0000 g | INTRAMUSCULAR | Status: AC
  Administered 2017-05-06 – 2017-05-12 (×7): 1 g via INTRAVENOUS
  Filled 2017-05-06 (×8): qty 1

## 2017-05-06 MED ORDER — FUROSEMIDE 10 MG/ML IJ SOLN
INTRAMUSCULAR | Status: AC
  Administered 2017-05-06: 80 mg via INTRAVENOUS
  Filled 2017-05-06: qty 8

## 2017-05-06 MED ORDER — NITROGLYCERIN IN D5W 200-5 MCG/ML-% IV SOLN
INTRAVENOUS | Status: AC
  Administered 2017-05-06: 16.667 ug/min via INTRAVENOUS
  Filled 2017-05-06: qty 250

## 2017-05-06 MED ORDER — FUROSEMIDE 10 MG/ML IJ SOLN
80.0000 mg | INTRAMUSCULAR | Status: AC
  Administered 2017-05-06: 80 mg via INTRAVENOUS

## 2017-05-06 MED ORDER — IPRATROPIUM-ALBUTEROL 0.5-2.5 (3) MG/3ML IN SOLN
3.0000 mL | Freq: Four times a day (QID) | RESPIRATORY_TRACT | Status: DC
  Administered 2017-05-07 – 2017-05-08 (×5): 3 mL via RESPIRATORY_TRACT
  Filled 2017-05-06 (×6): qty 3

## 2017-05-06 MED ORDER — NITROGLYCERIN IN D5W 200-5 MCG/ML-% IV SOLN
0.0000 ug/min | INTRAVENOUS | Status: DC
  Administered 2017-05-06: 16.667 ug/min via INTRAVENOUS
  Administered 2017-05-07: 155 ug/min via INTRAVENOUS
  Administered 2017-05-07: 80 ug/min via INTRAVENOUS
  Filled 2017-05-06 (×3): qty 250

## 2017-05-06 MED ORDER — ENOXAPARIN SODIUM 30 MG/0.3ML ~~LOC~~ SOLN
30.0000 mg | SUBCUTANEOUS | Status: DC
  Administered 2017-05-06 – 2017-05-18 (×12): 30 mg via SUBCUTANEOUS
  Filled 2017-05-06 (×12): qty 0.3

## 2017-05-06 NOTE — Progress Notes (Signed)
Patient had incontinent episode. Stood at bedside to bathe and change sheets. Patient requested to stay on CPAP. Tolerated mobility well.

## 2017-05-06 NOTE — Care Management Note (Signed)
Case Management Note Marvetta Gibbons RN, BSN Unit 4E-Case Manager- [place] coverage [phone]  Patient Details  Name: Nathan Wolfe MRN: [id number] Date of Birth: 15-Mar-1945  Subjective/Objective:   Pt admitted with USA/STEMI, +Flu A/CAP                 Action/Plan: PTA pt lived at home with spouse, CM to follow for transition of care needs.   Expected Discharge Date:                  Expected Discharge Plan:  Home/Self Care  In-House Referral:     Discharge planning Services  CM Consult  Post Acute Care Choice:    Choice offered to:     DME Arranged:    DME Agency:     HH Arranged:    HH Agency:     Status of Service:  In process, will continue to follow  If discussed at Long Length of Stay Meetings, dates discussed:    Discharge Disposition:   Additional Comments:  Nathan Patricia, RN 05/06/2017, 11:50 AM

## 2017-05-06 NOTE — Progress Notes (Signed)
Progress Note  Patient Name: Nathan Wolfe Date of Encounter: 05/06/2017  Primary Cardiologist: Quay Burow, MD   Subjective   Patient was resting in his bed today upon entering the room.  He denied chest pain over the past 24 hours stating that he felt somewhat improved.  His cough has diminished and is producing decreased degree of sputum.  A later point second evaluation the patient was sitting in his chair eating breakfast, he was not dyspneic, he was satting above 95% on 2 L oxygen denied chest pain.  Inpatient Medications    Scheduled Meds: . allopurinol  100 mg Oral Daily  . amLODipine  10 mg Oral Daily  . aspirin EC  81 mg Oral QHS  . brimonidine  1 drop Both Eyes QODAY  . chlorhexidine  15 mL Mouth Rinse BID  . clopidogrel  75 mg Oral Daily  . docusate sodium  100 mg Oral Daily  . doxazosin  4 mg Oral Daily  . ezetimibe  10 mg Oral Daily  . fenofibrate  160 mg Oral Daily  . FLUoxetine  20 mg Oral Daily  . furosemide  40 mg Intravenous BID  . hydrALAZINE  75 mg Oral BID  . ipratropium-albuterol  3 mL Nebulization Q4H  . isosorbide mononitrate  120 mg Oral Daily  . mouth rinse  15 mL Mouth Rinse q12n4p  . Melatonin  3 mg Oral QHS  . metoprolol tartrate  50 mg Oral BID  .  morphine injection  2 mg Intravenous Once  . oseltamivir  30 mg Oral Daily  . polyethylene glycol  17 g Oral BID  . sodium chloride flush  3 mL Intravenous Q12H   Continuous Infusions: . sodium chloride    . sodium chloride    . ceFEPime (MAXIPIME) IV Stopped (05/05/17 1957)  . heparin 2,100 Units/hr (05/06/17 9379)   PRN Meds: sodium chloride, acetaminophen, ALPRAZolam, benzonatate, diphenhydrAMINE, gi cocktail, hydrALAZINE, ipratropium-albuterol, morphine injection, nitroGLYCERIN, ondansetron (ZOFRAN) IV, sodium chloride flush   Vital Signs    Vitals:   05/06/17 0421 05/06/17 0441 05/06/17 0500 05/06/17 0600  BP: 119/60  (!) 102/55   Pulse:  61    Resp: 19 (!) 22 17 19   Temp:       TempSrc:      SpO2:  94%    Weight:      Height:        Intake/Output Summary (Last 24 hours) at 05/06/2017 0639 Last data filed at 05/06/2017 0600 Gross per 24 hour  Intake 1163.72 ml  Output 2575 ml  Net -1411.28 ml   Filed Weights   05/04/17 0456 05/05/17 0500 05/06/17 0400  Weight: 124.1 kg (273 lb 9.5 oz) 124.6 kg (274 lb 11.1 oz) 122.6 kg (270 lb 4.5 oz)    Telemetry    NSR- Personally Reviewed  ECG    ST depression in the lateral leads V4-V6, not clearly observed previously- Personally Reviewed  Physical Exam   GEN: No acute distress.  Sitting up in his chair alert and oriented x3 Neck: No JVD Cardiac: RRR, no murmurs, rubs, or gallops.  Respiratory: Clear to auscultation bilaterally. GI: Soft, nontender, non-distended  MS: No edema; No deformity. Neuro:  Nonfocal  Psych: Normal affect   Labs    Chemistry Recent Labs  Lab 05/01/17 0316  05/04/17 0303 05/05/17 0245 05/06/17 0304  NA 141   < > 135 133* 133*  K 3.4*   < > 4.8 4.3 4.2  CL 112*   < >  107 103 101  CO2 20*   < > 16* 19* 22  GLUCOSE 116*   < > 105* 118* 112*  BUN 65*   < > 54* 55* 55*  CREATININE 2.84*   < > 2.72* 2.77* 2.85*  CALCIUM 8.5*   < > 8.8* 8.6* 8.6*  PROT 6.0*  --  6.5 6.0*  --   ALBUMIN 3.3*  --  3.1* 2.7*  --   AST 22  --  42* 36  --   ALT 23  --  29 33  --   ALKPHOS 32*  --  24* 21*  --   BILITOT 0.5  --  0.9 0.9  --   GFRNONAA 21*   < > 22* 21* 20*  GFRAA 24*   < > 25* 25* 24*  ANIONGAP 9   < > 12 11 10    < > = values in this interval not displayed.     Hematology Recent Labs  Lab 05/04/17 0303 05/05/17 0245 05/06/17 0304  WBC 6.9 6.2 6.5  RBC 3.49* 3.04* 2.88*  HGB 11.4* 10.0* 9.4*  HCT 34.4* 29.9* 27.8*  MCV 98.6 98.4 96.5  MCH 32.7 32.9 32.6  MCHC 33.1 33.4 33.8  RDW 14.4 14.5 14.0  PLT 144* 136* 152    Cardiac Enzymes Recent Labs  Lab 05/02/17 1102 05/02/17 1852 05/03/17 0255 05/04/17 0303  TROPONINI 2.80* 2.63* 2.97* 1.95*    Recent Labs    Lab 05/01/17 0004  TROPIPOC 0.00     BNP Recent Labs  Lab 05/01/17 0316  BNP 366.5*     DDimer No results for input(s): DDIMER in the last 168 hours.   Radiology    Dg Chest Port 1 View  Result Date: 05/05/2017 CLINICAL DATA:  Hypoxia. EXAM: PORTABLE CHEST 1 VIEW COMPARISON:  05/04/2017.  05/01/2017. FINDINGS: Prior CABG. Stable cardiomegaly. Slight improvement bibasilar interstitial prominence suggesting slight clearing of pulmonary interstitial edema. Small left pleural effusion. No pneumothorax. Degenerative changes scoliosis thoracic spine. IMPRESSION: Prior CABG. Stable cardiomegaly. Persistent but slightly improved bibasilar interstitial prominence suggesting slight clearing of pulmonary interstitial edema. Small left pleural effusion. Electronically Signed   By: Marcello Moores  Register   On: 05/05/2017 07:36   Dg Chest Port 1 View  Result Date: 05/04/2017 CLINICAL DATA:  Cough. EXAM: PORTABLE CHEST 1 VIEW COMPARISON:  Single-view of the chest 0 3/15/scratch the single view of the chest and PA and lateral chest 05/01/2017. FINDINGS: There is cardiomegaly. The patient has new airspace disease in the mid and lower lung zones bilaterally. Aortic atherosclerosis is noted. Trace right pleural effusion is unchanged. IMPRESSION: New bilateral airspace disease could be due to edema or pneumonia. Cardiomegaly. Atherosclerosis. Electronically Signed   By: Inge Rise M.D.   On: 05/04/2017 14:16   Cardiac Studies   Echo3/17/19: Study Conclusions  - Left ventricle: The cavity size was normal. Wall thickness was increased in a pattern of mild LVH. Systolic function was normal. The estimated ejection fraction was in the range of 50% to 55%. Mild hypokinesis of the anteroseptal myocardium. Features are consistent with a pseudonormal left ventricular filling pattern, with concomitant abnormal relaxation and increased filling pressure (grade 2 diastolic dysfunction). Doppler  parameters are consistent with high ventricular filling pressure. - Aortic valve: Transvalvular velocity was within the normal range. There was no stenosis. There was no regurgitation. - Mitral valve: Transvalvular velocity was within the normal range. There was no evidence for stenosis. There was trivial regurgitation. - Left atrium: The atrium  was severely dilated. - Right ventricle: The cavity size was normal. Wall thickness was normal. Systolic function was normal. - Atrial septum: No defect or patent foramen ovale was identified by color flow Doppler. - Tricuspid valve: There was mild regurgitation. - Pulmonary arteries: Systolic pressure was moderately to severely increased. PA peak pressure: 59 mm Hg (S).  Cardiac cath in March 2016 as per chart review from outside facility.  Last catheterization was March 2016. "His EF was normal. He has a patent LIMA to the LAD with an occluded LAD beyond LIMA insertion. He has a patent vein graft to the right status post stenting of the PDA with a 2.5 mm Synergy drug-eluting stent to the vein graft and a diseased ramus branch was with not intervened on."  Patient Profile     72 y.o. male with a past medical history notable for CAD status post three-vessel CABG in 2000 with redo bypass in 2011 with multiple PCI's, CKD stage IV. He presented with unstable angina, cough was noted to have an elevated troponin without corresponding EKG changes. Patient is being treated for influenza A vs CAP pneumonia withassociatedcough, dyspnea, and generalized malaise.  The patient continues to experience significant dyspnea which is to be expected given his influenza status.    Assessment & Plan    Unstable angina/STEMI: Given the patient's improvement with his current supportive therapy for his other chronic medical conditions and influenza/CAP we will continue to refrain from cardiac catheterization unless clear ischemia develops.  Given his  renal function, is not advisable to pursue unnecessary cardiac cath at this time.  Patient's troponins peaked and chest pain is resolved/greatly improved for the past 48 hours. We believe the elevated troponin levels to most likely be secondary to demand ischemia as a result of the patient's influenza.  As his acute illness improves, so should his acute chest pain. -Continue NTG sublingual as needed for chest pain -Continue Imdur 1-20 mg daily -Continue metoprolol tartrate 50 mg twice daily -Continue ASA and Plavix. -Continue Xanax as needed for anxiety given the patient's increased chest pain with bouts of anxiety  HTN: Patient continues to mildly hypertensive at 979'Y systolic range. -Continue amlodipine 10 mg daily -Continue hydralazine 75 mg twice daily -Continue beta-blocker and doxazosin  Influenza/CAP: Continue Tamiflu.  Rest as per primary team. -Chest x-ray repeated to observe for improving or changing pulmonary infiltrates.  If improved, would recommend discontinuing IV Lasix in favor of low-dose p.o. Lasix.  CKD IV: Patient's creatinine slightly worse today at 2.5 up from 2.77 the previous day.  This is possibly due to IV furosemide given previous day as well as the vancomycin dose that he received.  Given the improvement in his overall status clinical appearance we recommend decreasing the IV furosemide. -Continue daily BMPs to monitor renal function  For questions or updates, please contact [place] Please consult [email or web address] for contact info under Cardiology/STEMI.   Please see attending note/attestation for current assessment and plan  Signed, Kathi Ludwig, MD  05/06/2017, 6:39 AM

## 2017-05-06 NOTE — Progress Notes (Signed)
ANTICOAGULATION CONSULT NOTE  Pharmacy Consult: Heparin  Indication: chest pain/ACS  Allergies  Allergen Reactions  . Statins Other (See Comments)    Full body muscle stuffiness/pain    Patient Measurements: Weight = 118.8 kg Height = 67 inches  Vital Signs: Temp: 98.4 F (36.9 C) (03/20 0416) Temp Source: Oral (03/20 0416) BP: 119/60 (03/20 0421) Pulse Rate: 61 (03/20 0441)  Labs: Recent Labs    05/04/17 0303 05/05/17 0245 05/06/17 0304  HGB 11.4* 10.0* 9.4*  HCT 34.4* 29.9* 27.8*  PLT 144* 136* 152  HEPARINUNFRC 0.42 0.30 0.11*  CREATININE 2.72* 2.77* 2.85*  TROPONINI 1.95*  --   --     Assessment: 71 YOM continues on IV heparin for ACS. Plan for cath postponed.  Heparin is SUBtherapeutic this morning. Spoke with RN, there have not been any issues with the infusion. Levels have been downtrending.  Goal of Therapy:  Heparin level 0.3-0.7 units/ml Monitor platelets by anticoagulation protocol: Yes   Plan:  Increase heparin gtt to 2100 units/hr Monitor daily heparin level, CBC Check confirmatory level   Harvel Quale 05/06/2017 4:47 AM

## 2017-05-06 NOTE — Progress Notes (Signed)
Followed up with patient and his daughter today.  Had long discussion offering support to patient and walking through his understanding of where is health is at this time.  He reports to just wanting to get back to [place] and stay in his home and spend time with his wife.  He reports his cardiologist has informed him there is nothing for a surgery/intervention stand point to make much improvement in his cardiac function.  We discussed what changes he envisions and what type of support her thought he would need to go home.  He reports he is open to involving Quality of life discussion in his plan of care and evaluate what that might mean to add support for him and his wife at home.  Patients daughter was open and eager to be involved in supporting parents during those conversations.    They would like to speak with care manager to understand what type of support can be provided to transfer to daughters home in town and create plan to get back to [place].    Case management consult is ordered and present in chart.  If MD feels apporpiate patient and family report being open to palliative conversations during our discussing today.  Patient and family thanked me for time and support.    Richardean Canal RN, BSN, CCRN

## 2017-05-06 NOTE — Progress Notes (Signed)
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  Patient reports symptoms of pain radiating to his jaw which is similar to previous heart attacks.  He had taken 4 nitroglycerin with temporary relief of his symptoms.  In the emergency department, patient was found to be flu positive.  Troponin had started trending upward, cardiology was consulted.  Patient was placed on heparin drip.  Given patient's respiratory illness, catheterization was deferred.  Patient currently on Tamiflu, cefepime and azithromycin for influenza.   Assessment & Plan   Sepsis secondary to Influenza A with possible pneumonia -Was febrile (temp 102.9F on 3/19) with tachypnea -Influenza PCR positive for A -Blood cultures show no growth to date -Chest x-ray on admission (05/01/2017) appeared clear with some cardiomegaly.  Chest x-ray on 05/05/2017 showed small left pleural effusion with some improved clearing of patient's interstitial edema. -Sputum culture showed few GPC in clusters, few GNR and few GR -Continue Tamiflu, azithromycin, cefepime  NSTEMI/Unstable Angina -Initial troponin was 0.04 on admission however troponin peaked at 3.21 and currently trending downward -Suspected to be secondary to demand ischemia secondary to respiratory illness -Patient placed on heparin drip -Cardiology consulted and appreciated.  Initially had considered catheterization however this was held due to respiratory illness.  Recommended treating angina medically. -Currently chest pain-free -Echocardiogram shows an EF of 50-55%, grade  2 diastolic dysfunction -Continue imdur, metoprolol, plavix, aspirin   Acute on chronic diastolic heart failure -BNP 366.5 on admission -CXR from 3/19 shows improvement in pulmonary edema -Echocardiogram as above -Continue IV Lasix 40 mg twice daily -Monitor intake and output, daily weights -Urine output over the past 24 hours 2575 cc  Chronic kidney disease, stage IV -Creatinine currently stable at 2.85 -Monitor BMP closely given the patient is currently diuresing  Non-anion gap metabolic acidosis -Likely secondary to CKD, stage IV -Resolved  Anemia of chronic disease -hemoglobin currently 9.4, appears to be stable -Monitor CBC  Essential hypertension -Continue metoprolol, amlodipine, hydralazine -Currently on IV Lasix  BPH -Continue Cardura -Patient did require Foley catheter for retention which is now been removed and patient has been able to void.  Dyslipidemia -Continue fenofibrate, zetia  -may consider PCSK9 as an outpatient  OSA -continue CPAP nightly  Depression/Anxiety -Continue Prozac, Xanax as needed -Discussed with patient, he does not feel that he is more anxious. -Daughter at bedside and wanting psychiatry consultation for anxiety. -Discussed with daughter and patient, patient should continue his home medications and follow-up with psychiatry or psychologist as an outpatient. -Patient currently states that his wife is in the emergency department with influenza as well as pneumonia. -He has reasons to feel somewhat anxious.  Would not want to change his medications to mask potential medical problems. -Discussed with psychiatry, Dr. Mariea Clonts, who recommended outpatient follow-up with psychologist or psychiatrist.  Would not change medications at this time.  DVT Prophylaxis  lovenox  Code Status: Full  Family Communication: Daughter at bedside  Disposition Plan: Admitted. Pending further recommendations from cardiology. Suspect home at discharge. Transfer  to tele.   Consultants Cardiology Psychiatry via phone  Procedures  Echocardiogram   Antibiotics   Anti-infectives (From admission, onward)   Start  Dose/Rate Route Frequency Ordered Stop   05/06/17 2000  vancomycin (VANCOCIN) 1,750 mg in sodium chloride 0.9 % 500 mL IVPB  Status:  Discontinued     1,750 mg 250 mL/hr over 120 Minutes Intravenous Every 48 hours 05/04/17 1925 05/05/17 1858   05/04/17 2000  ceFEPIme (MAXIPIME) 2 g in sodium chloride 0.9 % 100 mL IVPB     2 g 200 mL/hr over 30 Minutes Intravenous Every 24 hours 05/04/17 1907 05/13/17 1959   05/04/17 2000  vancomycin (VANCOCIN) 2,000 mg in sodium chloride 0.9 % 500 mL IVPB     2,000 mg 250 mL/hr over 120 Minutes Intravenous  Once 05/04/17 1925 05/04/17 2315   05/04/17 1915  ceFEPIme (MAXIPIME) 2 GM / 49mL IVPB premix  Status:  Discontinued     2 g 100 mL/hr over 30 Minutes Intravenous Every 24 hours 05/04/17 1900 05/04/17 1906   05/02/17 0500  oseltamivir (TAMIFLU) capsule 30 mg    Comments:  Appreciate pharmacy assistance w dosing   30 mg Oral Daily 05/02/17 0455 05/06/17 0934   05/01/17 2330  azithromycin (ZITHROMAX) 500 mg in sodium chloride 0.9 % 250 mL IVPB     500 mg 250 mL/hr over 60 Minutes Intravenous Every 24 hours 05/01/17 2322 05/05/17 2356   05/01/17 2330  cefTRIAXone (ROCEPHIN) injection 1 g  Status:  Discontinued     1 g Intramuscular Every 24 hours 05/01/17 2322 05/01/17 2325   05/01/17 2330  cefTRIAXone (ROCEPHIN) 1 g in sodium chloride 0.9 % 100 mL IVPB  Status:  Discontinued     1 g 200 mL/hr over 30 Minutes Intravenous Every 24 hours 05/01/17 2326 05/04/17 1900      Subjective:   Nathan Wolfe seen and examined today.  Denies chest pain, shortness of breath, abdominal pain, nausea or vomiting, diarrhea or constipation.  States he feels very weak and exhausted.  Concerned that his wife was admitted with pneumonia as well as influenza.  Objective:   Vitals:   05/06/17 0500 05/06/17  0600 05/06/17 0800 05/06/17 0900  BP: (!) 102/55  (!) 143/59   Pulse:      Resp: 17 19 20    Temp:   98.3 F (36.8 C)   TempSrc:      SpO2:    96%  Weight:      Height:        Intake/Output Summary (Last 24 hours) at 05/06/2017 1056 Last data filed at 05/06/2017 0600 Gross per 24 hour  Intake 850 ml  Output 2225 ml  Net -1375 ml   Filed Weights   05/04/17 0456 05/05/17 0500 05/06/17 0400  Weight: 124.1 kg (273 lb 9.5 oz) 124.6 kg (274 lb 11.1 oz) 122.6 kg (270 lb 4.5 oz)    Exam  General: Well developed, well nourished, NAD, appears stated age  HEENT: NCAT, mucous membranes moist.   Neck: Supple  Cardiovascular: S1 S2 auscultated, no rubs, murmurs or gallops. Regular rate and rhythm.  Respiratory: Diminished however clear breath sounds.  Occ fry cough.  Abdomen: Soft, obese, nontender, nondistended, + bowel sounds  Extremities: warm dry without cyanosis clubbing or edema  Neuro: AAOx3, nonfocal  Skin: Without rashes exudates or nodules  Psych: Appropriate mood and affect   Data Reviewed: I have personally reviewed following labs and imaging studies  CBC: Recent Labs  Lab 05/01/17 0004  05/02/17 0642 05/03/17 0255 05/04/17 0303 05/05/17 0245 05/06/17 0304  WBC 9.6  --  9.0 7.9 6.9 6.2 6.5  NEUTROABS 7.1  --   --   --   --   --   --  HGB 12.7*   < > 11.6* 10.7* 11.4* 10.0* 9.4*  HCT 37.2*   < > 35.0* 33.1* 34.4* 29.9* 27.8*  MCV 97.6  --  98.9 98.8 98.6 98.4 96.5  PLT 218  --  172 161 144* 136* 152   < > = values in this interval not displayed.   Basic Metabolic Panel: Recent Labs  Lab 05/01/17 0803 05/02/17 0642 05/02/17 1102 05/03/17 0255 05/04/17 0303 05/05/17 0245 05/06/17 0304  NA  --  138  --  133* 135 133* 133*  K  --  4.2  --  4.1 4.8 4.3 4.2  CL  --  108  --  104 107 103 101  CO2  --  17*  --  18* 16* 19* 22  GLUCOSE  --  128*  --  130* 105* 118* 112*  BUN  --  54*  --  57* 54* 55* 55*  CREATININE  --  2.97*  --  2.94* 2.72* 2.77*  2.85*  CALCIUM  --  9.2  --  8.4* 8.8* 8.6* 8.6*  MG 2.1  --  1.9 2.0 2.1 2.4  --    GFR: Estimated Creatinine Clearance: 29 mL/min (A) (by C-G formula based on SCr of 2.85 mg/dL (H)). Liver Function Tests: Recent Labs  Lab 05/01/17 0316 05/04/17 0303 05/05/17 0245  AST 22 42* 36  ALT 23 29 33  ALKPHOS 32* 24* 21*  BILITOT 0.5 0.9 0.9  PROT 6.0* 6.5 6.0*  ALBUMIN 3.3* 3.1* 2.7*   No results for input(s): LIPASE, AMYLASE in the last 168 hours. No results for input(s): AMMONIA in the last 168 hours. Coagulation Profile: Recent Labs  Lab 05/01/17 0157  INR 1.18   Cardiac Enzymes: Recent Labs  Lab 05/02/17 0642 05/02/17 1102 05/02/17 1852 05/03/17 0255 05/04/17 0303  TROPONINI 3.21* 2.80* 2.63* 2.97* 1.95*   BNP (last 3 results) No results for input(s): PROBNP in the last 8760 hours. HbA1C: No results for input(s): HGBA1C in the last 72 hours. CBG: Recent Labs  Lab 05/02/17 0755  GLUCAP 133*   Lipid Profile: No results for input(s): CHOL, HDL, LDLCALC, TRIG, CHOLHDL, LDLDIRECT in the last 72 hours. Thyroid Function Tests: No results for input(s): TSH, T4TOTAL, FREET4, T3FREE, THYROIDAB in the last 72 hours. Anemia Panel: No results for input(s): VITAMINB12, FOLATE, FERRITIN, TIBC, IRON, RETICCTPCT in the last 72 hours. Urine analysis: No results found for: COLORURINE, APPEARANCEUR, LABSPEC, PHURINE, GLUCOSEU, HGBUR, BILIRUBINUR, KETONESUR, PROTEINUR, UROBILINOGEN, NITRITE, LEUKOCYTESUR Sepsis Labs: @LABRCNTIP (procalcitonin:4,lacticidven:4)  ) Recent Results (from the past 240 hour(s))  Culture, blood (routine x 2)     Status: None (Preliminary result)   Collection Time: 05/02/17  1:06 AM  Result Value Ref Range Status   Specimen Description BLOOD RIGHT HAND  Final   Special Requests   Final    BOTTLES DRAWN AEROBIC AND ANAEROBIC Blood Culture results may not be optimal due to an inadequate volume of blood received in culture bottles   Culture   Final     NO GROWTH 3 DAYS Performed at [facility], [place] [address] [place], [place] [address]    Report Status PENDING  Incomplete  Culture, blood (routine x 2)     Status: None (Preliminary result)   Collection Time: 05/02/17  1:07 AM  Result Value Ref Range Status   Specimen Description BLOOD RIGHT HAND  Final   Special Requests   Final    BOTTLES DRAWN AEROBIC AND ANAEROBIC Blood Culture results may not  be optimal due to an inadequate volume of blood received in culture bottles   Culture   Final    NO GROWTH 3 DAYS Performed at [facility], [place] [address] [place], [place] [address]    Report Status PENDING  Incomplete  MRSA PCR Screening     Status: None   Collection Time: 05/02/17  9:45 AM  Result Value Ref Range Status   MRSA by PCR NEGATIVE NEGATIVE Final    Comment:        The GeneXpert MRSA Assay (FDA approved for NASAL specimens only), is one component of a comprehensive MRSA colonization surveillance program. It is not intended to diagnose MRSA infection nor to guide or monitor treatment for MRSA infections. Performed at [facility], [place] [address] [place], [place] [address]   MRSA PCR Screening     Status: None   Collection Time: 05/04/17  5:15 PM  Result Value Ref Range Status   MRSA by PCR NEGATIVE NEGATIVE Final    Comment:        The GeneXpert MRSA Assay (FDA approved for NASAL specimens only), is one component of a comprehensive MRSA colonization surveillance program. It is not intended to diagnose MRSA infection nor to guide or monitor treatment for MRSA infections. Performed at [facility], [place] [address] [place], [place] [address]   Culture, expectorated sputum-assessment     Status: None   Collection Time: 05/04/17  6:06 PM  Result Value Ref Range Status   Specimen Description EXPECTORATED SPUTUM  Final   Special Requests NONE  Final   Sputum evaluation   Final    THIS SPECIMEN IS ACCEPTABLE FOR SPUTUM CULTURE Performed  at [facility], [place] [address] [place], [place] [address]    Report Status 05/04/2017 FINAL  Final  Culture, respiratory (NON-Expectorated)     Status: None (Preliminary result)   Collection Time: 05/04/17  6:06 PM  Result Value Ref Range Status   Specimen Description EXPECTORATED SPUTUM  Final   Special Requests NONE Reflexed from [id number]  Final   Gram Stain   Final    FEW WBC PRESENT, PREDOMINANTLY PMN FEW SQUAMOUS EPITHELIAL CELLS PRESENT FEW GRAM POSITIVE COCCI IN CLUSTERS FEW GRAM NEGATIVE RODS RARE GRAM POSITIVE RODS    Culture   Final    CULTURE REINCUBATED FOR BETTER GROWTH Performed at [facility], [place] [address] [place], [place] [address]    Report Status PENDING  Incomplete  Culture, blood (routine x 2) Call MD if unable to obtain prior to antibiotics being given     Status: None (Preliminary result)   Collection Time: 05/04/17  8:30 PM  Result Value Ref Range Status   Specimen Description BLOOD RIGHT HAND  Final   Special Requests Blood Culture adequate volume BLOOD AEROBIC BOTTLE  Final   Culture   Final    NO GROWTH < 12 HOURS Performed at [facility], [place] [address] [place], [place] [address]    Report Status PENDING  Incomplete  Culture, blood (routine x 2) Call MD if unable to obtain prior to antibiotics being given     Status: None (Preliminary result)   Collection Time: 05/04/17  8:35 PM  Result Value Ref Range Status   Specimen Description BLOOD RIGHT HAND  Final   Special Requests BLOOD AEROBIC BOTTLE Blood Culture adequate volume  Final   Culture   Final    NO GROWTH < 12 HOURS Performed  at [facility], [place] [address], [place], [place] [address]    Report Status PENDING  Incomplete      Radiology Studies: Dg Chest Port 1 View  Result Date: 05/05/2017 CLINICAL DATA:  Hypoxia. EXAM: PORTABLE CHEST 1 VIEW COMPARISON:  05/04/2017.  05/01/2017. FINDINGS: Prior CABG. Stable cardiomegaly. Slight improvement bibasilar  interstitial prominence suggesting slight clearing of pulmonary interstitial edema. Small left pleural effusion. No pneumothorax. Degenerative changes scoliosis thoracic spine. IMPRESSION: Prior CABG. Stable cardiomegaly. Persistent but slightly improved bibasilar interstitial prominence suggesting slight clearing of pulmonary interstitial edema. Small left pleural effusion. Electronically Signed   By: Marcello Moores  Register   On: 05/05/2017 07:36   Dg Chest Port 1 View  Result Date: 05/04/2017 CLINICAL DATA:  Cough. EXAM: PORTABLE CHEST 1 VIEW COMPARISON:  Single-view of the chest 0 3/15/scratch the single view of the chest and PA and lateral chest 05/01/2017. FINDINGS: There is cardiomegaly. The patient has new airspace disease in the mid and lower lung zones bilaterally. Aortic atherosclerosis is noted. Trace right pleural effusion is unchanged. IMPRESSION: New bilateral airspace disease could be due to edema or pneumonia. Cardiomegaly. Atherosclerosis. Electronically Signed   By: Inge Rise M.D.   On: 05/04/2017 14:16     Scheduled Meds: . allopurinol  100 mg Oral Daily  . amLODipine  10 mg Oral Daily  . aspirin EC  81 mg Oral QHS  . brimonidine  1 drop Both Eyes QODAY  . chlorhexidine  15 mL Mouth Rinse BID  . clopidogrel  75 mg Oral Daily  . docusate sodium  100 mg Oral Daily  . doxazosin  4 mg Oral Daily  . enoxaparin (LOVENOX) injection  30 mg Subcutaneous Q24H  . ezetimibe  10 mg Oral Daily  . fenofibrate  160 mg Oral Daily  . FLUoxetine  20 mg Oral Daily  . furosemide  40 mg Intravenous BID  . hydrALAZINE  75 mg Oral BID  . ipratropium-albuterol  3 mL Nebulization Q4H  . isosorbide mononitrate  120 mg Oral Daily  . mouth rinse  15 mL Mouth Rinse q12n4p  . Melatonin  3 mg Oral QHS  . metoprolol tartrate  50 mg Oral BID  .  morphine injection  2 mg Intravenous Once  . polyethylene glycol  17 g Oral BID  . sodium chloride flush  3 mL Intravenous Q12H   Continuous Infusions: .  sodium chloride    . sodium chloride    . ceFEPime (MAXIPIME) IV Stopped (05/05/17 1957)     LOS: 5 days   Time Spent in minutes   45 minutes  Magdalene Tardiff D.O. on 05/06/2017 at 10:56 AM  Between 7am to 7pm - Pager - [phone]  After 7pm go to [email or web address] - password [email or web address]  And look for the night coverage person covering for me after hours  [facility] Office  [phone]

## 2017-05-06 NOTE — Progress Notes (Signed)
PHARMACY NOTE:  ANTIMICROBIAL RENAL DOSAGE ADJUSTMENT  Current antimicrobial regimen includes a mismatch between antimicrobial dosage and estimated renal function.  As per policy approved by the Pharmacy & Therapeutics and Medical Executive Committees, the antimicrobial dosage will be adjusted accordingly.  Current antimicrobial dosage:  Cefepime 2g q24h  Indication: HCAP  Renal Function:  Estimated Creatinine Clearance: 29 mL/min (A) (by C-G formula based on SCr of 2.85 mg/dL (H)). []      On intermittent HD, scheduled: []      On CRRT    Antimicrobial dosage has been changed to:  Cefepime 1g q24h  Additional comments:   Thank you for allowing pharmacy to be a part of this patient's care.  Carnella [place], [facility] 05/06/2017 11:19 AM

## 2017-05-07 ENCOUNTER — Encounter (HOSPITAL_COMMUNITY): Payer: Self-pay | Admitting: Emergency Medicine

## 2017-05-07 ENCOUNTER — Other Ambulatory Visit: Payer: Self-pay

## 2017-05-07 DIAGNOSIS — J810 Acute pulmonary edema: Secondary | ICD-10-CM

## 2017-05-07 DIAGNOSIS — J811 Chronic pulmonary edema: Secondary | ICD-10-CM

## 2017-05-07 DIAGNOSIS — F419 Anxiety disorder, unspecified: Secondary | ICD-10-CM

## 2017-05-07 LAB — CULTURE, RESPIRATORY

## 2017-05-07 LAB — CULTURE, BLOOD (ROUTINE X 2)
CULTURE: NO GROWTH
Culture: NO GROWTH

## 2017-05-07 LAB — POCT I-STAT 3, ART BLOOD GAS (G3+)
ACID-BASE DEFICIT: 4 mmol/L — AB (ref 0.0–2.0)
BICARBONATE: 21.3 mmol/L (ref 20.0–28.0)
O2 Saturation: 92 %
PCO2 ART: 36.8 mmHg (ref 32.0–48.0)
PO2 ART: 64 mmHg — AB (ref 83.0–108.0)
Patient temperature: 97.6
TCO2: 22 mmol/L (ref 22–32)
pH, Arterial: 7.368 (ref 7.350–7.450)

## 2017-05-07 LAB — BASIC METABOLIC PANEL
ANION GAP: 12 (ref 5–15)
BUN: 63 mg/dL — ABNORMAL HIGH (ref 6–20)
CALCIUM: 8.9 mg/dL (ref 8.9–10.3)
CHLORIDE: 100 mmol/L — AB (ref 101–111)
CO2: 22 mmol/L (ref 22–32)
Creatinine, Ser: 2.94 mg/dL — ABNORMAL HIGH (ref 0.61–1.24)
GFR calc non Af Amer: 20 mL/min — ABNORMAL LOW (ref >60)
GFR, EST AFRICAN AMERICAN: 23 mL/min — AB (ref >60)
Glucose, Bld: 196 mg/dL — ABNORMAL HIGH (ref 65–99)
Potassium: 4.2 mmol/L (ref 3.5–5.1)
SODIUM: 134 mmol/L — AB (ref 135–145)

## 2017-05-07 LAB — CBC
HCT: 29.3 % — ABNORMAL LOW (ref 39.0–52.0)
HEMOGLOBIN: 9.8 g/dL — AB (ref 13.0–17.0)
MCH: 32.1 pg (ref 26.0–34.0)
MCHC: 33.4 g/dL (ref 30.0–36.0)
MCV: 96.1 fL (ref 78.0–100.0)
Platelets: 212 10*3/uL (ref 150–400)
RBC: 3.05 MIL/uL — AB (ref 4.22–5.81)
RDW: 14 % (ref 11.5–15.5)
WBC: 8.5 10*3/uL (ref 4.0–10.5)

## 2017-05-07 LAB — CULTURE, RESPIRATORY W GRAM STAIN: Culture: NORMAL

## 2017-05-07 LAB — TROPONIN I
TROPONIN I: 0.43 ng/mL — AB (ref <0.03)
TROPONIN I: 3.76 ng/mL — AB (ref <0.03)
TROPONIN I: 4.22 ng/mL — AB (ref <0.03)
TROPONIN I: 4.49 ng/mL — AB (ref <0.03)

## 2017-05-07 LAB — PROCALCITONIN: Procalcitonin: 1.91 ng/mL

## 2017-05-07 MED ORDER — SODIUM CHLORIDE 0.9 % IV SOLN
100.0000 mg | Freq: Two times a day (BID) | INTRAVENOUS | Status: DC
  Administered 2017-05-07 – 2017-05-08 (×2): 100 mg via INTRAVENOUS
  Filled 2017-05-07 (×3): qty 100

## 2017-05-07 MED ORDER — AMIODARONE HCL IN DEXTROSE 360-4.14 MG/200ML-% IV SOLN
30.0000 mg/h | INTRAVENOUS | Status: DC
  Administered 2017-05-07: 30 mg/h via INTRAVENOUS
  Filled 2017-05-07: qty 200

## 2017-05-07 MED ORDER — RANOLAZINE ER 500 MG PO TB12
500.0000 mg | ORAL_TABLET | Freq: Two times a day (BID) | ORAL | Status: DC
  Administered 2017-05-07 (×2): 500 mg via ORAL
  Filled 2017-05-07 (×2): qty 1

## 2017-05-07 MED ORDER — AMIODARONE HCL IN DEXTROSE 360-4.14 MG/200ML-% IV SOLN
60.0000 mg/h | INTRAVENOUS | Status: DC
Start: 2017-05-07 — End: 2017-05-07
  Administered 2017-05-07 (×2): 60 mg/h via INTRAVENOUS

## 2017-05-07 MED ORDER — AMIODARONE HCL 200 MG PO TABS
400.0000 mg | ORAL_TABLET | Freq: Two times a day (BID) | ORAL | Status: DC
  Administered 2017-05-07 – 2017-05-09 (×6): 400 mg via ORAL
  Filled 2017-05-07 (×7): qty 2

## 2017-05-07 MED ORDER — SENNOSIDES-DOCUSATE SODIUM 8.6-50 MG PO TABS
1.0000 | ORAL_TABLET | Freq: Two times a day (BID) | ORAL | Status: DC
  Administered 2017-05-07 – 2017-05-18 (×21): 1 via ORAL
  Filled 2017-05-07 (×22): qty 1

## 2017-05-07 MED ORDER — AMIODARONE HCL IN DEXTROSE 360-4.14 MG/200ML-% IV SOLN
INTRAVENOUS | Status: AC
  Filled 2017-05-07: qty 200

## 2017-05-07 MED ORDER — AMIODARONE LOAD VIA INFUSION: 150.0000 mg | Freq: Once | INTRAVENOUS | Status: DC

## 2017-05-07 NOTE — Progress Notes (Addendum)
Patient complained of CP at 930 PM. EKG taken and 3 sublingual nitros given without improvement in CP. Morphine given. MD notified. MD gave verbal orders to start on nitro gtt. Nitro gtt started. Zofran and xanax given. Rapid response called. Breathing treatment given, another EKG performed, stat Chest xray taken. Patient transferred to [facility]. CP unresolved. VSS

## 2017-05-07 NOTE — Progress Notes (Signed)
CRITICAL VALUE ALERT  Critical Value: Troponin 3.76  Date & Time Notied:  05/07/2017 1240  Provider Notified: Dr.Jordan  Orders Received/Actions taken:

## 2017-05-07 NOTE — Progress Notes (Signed)
Progress Note  Patient Name: Nathan Wolfe Date of Encounter: 05/07/2017  Primary Cardiologist: Quay Burow, MD   Subjective   Patient was sitting up in his bed resting upon entering the room today.  He denied current chest pain stating that it abated at approximately 3 AM.  Prior to the previous night's episode he stated that he was sitting in his bed, not experiencing dyspnea or other concerning symptoms when the chest pain occurred suddenly.  He denied precluding symptoms or activity.  Patient further stated that chest pain required second dose of morphine and nitroglycerin drip to abate his symptoms.  Inpatient Medications    Scheduled Meds: . allopurinol  100 mg Oral Daily  . amiodarone  150 mg Intravenous Once  . amLODipine  10 mg Oral Daily  . aspirin EC  81 mg Oral QHS  . brimonidine  1 drop Both Eyes QODAY  . chlorhexidine  15 mL Mouth Rinse BID  . clopidogrel  75 mg Oral Daily  . docusate sodium  100 mg Oral Daily  . doxazosin  4 mg Oral Daily  . enoxaparin (LOVENOX) injection  30 mg Subcutaneous Q24H  . ezetimibe  10 mg Oral Daily  . fenofibrate  160 mg Oral Daily  . FLUoxetine  20 mg Oral Daily  . furosemide  40 mg Intravenous BID  . hydrALAZINE  75 mg Oral BID  . ipratropium-albuterol  3 mL Nebulization QID  . isosorbide mononitrate  120 mg Oral Daily  . mouth rinse  15 mL Mouth Rinse q12n4p  . Melatonin  3 mg Oral QHS  . metoprolol tartrate  50 mg Oral BID  .  morphine injection  2 mg Intravenous Once  . polyethylene glycol  17 g Oral BID  . sodium chloride flush  3 mL Intravenous Q12H   Continuous Infusions: . sodium chloride    . sodium chloride    . amiodarone Stopped (05/07/17 0715)   Followed by  . amiodarone 30 mg/hr (05/07/17 0718)  . ceFEPime (MAXIPIME) IV Stopped (05/07/17 0115)  . nitroGLYCERIN 110 mcg/min (05/07/17 0716)   PRN Meds: sodium chloride, acetaminophen, ALPRAZolam, benzonatate, diphenhydrAMINE, gi cocktail, hydrALAZINE,  ipratropium-albuterol, morphine injection, nitroGLYCERIN, ondansetron (ZOFRAN) IV, sodium chloride flush   Vital Signs    Vitals:   05/07/17 0430 05/07/17 0445 05/07/17 0500 05/07/17 0515  BP: 106/66 (!) 102/58 104/64 99/61  Pulse: 65 64 64 65  Resp: 16 15 15  (!) 22  Temp:      TempSrc:      SpO2: 98% 98% 98% 97%  Weight:      Height:        Intake/Output Summary (Last 24 hours) at 05/07/2017 0731 Last data filed at 05/07/2017 0024 Gross per 24 hour  Intake 460 ml  Output 950 ml  Net -490 ml   Filed Weights   05/06/17 0400 05/07/17 0024 05/07/17 0324  Weight: 122.6 kg (270 lb 4.5 oz) 123.1 kg (271 lb 6.2 oz) 123.1 kg (271 lb 6.2 oz)    Telemetry    Atrial fibrillation approximately 1 AM resolved.  Otherwise PVCs- Personally Reviewed  ECG    Normal sinus rhythm without T wave depression or elevation.- Personally Reviewed  Physical Exam   GEN: No acute distress.  Resting comfortably in his bed alert and oriented x3 Neck: No JVD Cardiac: RRR, no murmurs, rubs, or gallops.  Respiratory: Clear to auscultation bilaterally GI: Soft, nontender, non-distended  MS: No edema; No deformity. Neuro:  Nonfocal  Psych: Normal  affect   Labs    Chemistry Recent Labs  Lab 05/01/17 0316  05/04/17 0303 05/05/17 0245 05/06/17 0304 05/07/17 0304  NA 141   < > 135 133* 133* 134*  K 3.4*   < > 4.8 4.3 4.2 4.2  CL 112*   < > 107 103 101 100*  CO2 20*   < > 16* 19* 22 22  GLUCOSE 116*   < > 105* 118* 112* 196*  BUN 65*   < > 54* 55* 55* 63*  CREATININE 2.84*   < > 2.72* 2.77* 2.85* 2.94*  CALCIUM 8.5*   < > 8.8* 8.6* 8.6* 8.9  PROT 6.0*  --  6.5 6.0*  --   --   ALBUMIN 3.3*  --  3.1* 2.7*  --   --   AST 22  --  42* 36  --   --   ALT 23  --  29 33  --   --   ALKPHOS 32*  --  24* 21*  --   --   BILITOT 0.5  --  0.9 0.9  --   --   GFRNONAA 21*   < > 22* 21* 20* 20*  GFRAA 24*   < > 25* 25* 24* 23*  ANIONGAP 9   < > 12 11 10 12    < > = values in this interval not displayed.       Hematology Recent Labs  Lab 05/05/17 0245 05/06/17 0304 05/07/17 0304  WBC 6.2 6.5 8.5  RBC 3.04* 2.88* 3.05*  HGB 10.0* 9.4* 9.8*  HCT 29.9* 27.8* 29.3*  MCV 98.4 96.5 96.1  MCH 32.9 32.6 32.1  MCHC 33.4 33.8 33.4  RDW 14.5 14.0 14.0  PLT 136* 152 212    Cardiac Enzymes Recent Labs  Lab 05/02/17 1852 05/03/17 0255 05/04/17 0303 05/06/17 2237  TROPONINI 2.63* 2.97* 1.95* 0.43*    Recent Labs  Lab 05/01/17 0004  TROPIPOC 0.00     BNP Recent Labs  Lab 05/01/17 0316  BNP 366.5*     DDimer No results for input(s): DDIMER in the last 168 hours.   Radiology    Dg Chest Port 1 View  Result Date: 05/07/2017 CLINICAL DATA:  Initial evaluation for acute respiratory distress, chest pain. EXAM: PORTABLE CHEST 1 VIEW COMPARISON:  Prior radiograph from earlier the same day. FINDINGS: Median sternotomy wires underlying CABG markers again noted. Stable cardiomegaly. Mediastinal silhouette normal. Lungs normally inflated. Slightly worsened diffuse pulmonary vascular congestion with interstitial prominence, consistent with pulmonary edema. New parenchymal infiltrates within the right upper and bilateral lower lobes, which may reflect edema and/or possibly infiltrates. Small right pleural effusion. No pneumothorax. Osseous structures are unchanged. IMPRESSION: 1. Stable cardiomegaly with interval worsening in diffuse vascular and interstitial prominence, consistent with pulmonary edema. 2. New more confluent patchy right upper and bibasilar opacities, which may reflect edema and/or infiltrates. 3. Small right pleural effusion. Electronically Signed   By: Jeannine Boga M.D.   On: 05/07/2017 00:00   Dg Chest Port 1 View  Result Date: 05/06/2017 CLINICAL DATA:  Coronary artery disease, status post coronary artery bypass grafting EXAM: PORTABLE CHEST 1 VIEW COMPARISON:  May 05, 2017 pain May 01, 2017 FINDINGS: There are areas of scarring and fibrotic change bilaterally.  Mild interstitial pulmonary edema in the mid lower lung zones is stable. There is a small pleural effusion on each side. There is no new opacity. There is no airspace consolidation. There is cardiomegaly. The pulmonary vascularity  is normal. Patient is status post coronary artery bypass grafting. There is evidence of old rib trauma on the left superiorly. IMPRESSION: Mild interstitial edema, stable. Small pleural effusions bilaterally. Underlying areas of fibrosis and scarring. Stable cardiomegaly. No appreciable change compared to 1 day prior. Electronically Signed   By: Lowella Grip III M.D.   On: 05/06/2017 12:24    Cardiac Studies   Echo3/17/19: Study Conclusions  - Left ventricle: The cavity size was normal. Wall thickness was increased in a pattern of mild LVH. Systolic function was normal. The estimated ejection fraction was in the range of 50% to 55%. Mild hypokinesis of the anteroseptal myocardium. Features are consistent with a pseudonormal left ventricular filling pattern, with concomitant abnormal relaxation and increased filling pressure (grade 2 diastolic dysfunction). Doppler parameters are consistent with high ventricular filling pressure. - Aortic valve: Transvalvular velocity was within the normal range. There was no stenosis. There was no regurgitation. - Mitral valve: Transvalvular velocity was within the normal range. There was no evidence for stenosis. There was trivial regurgitation. - Left atrium: The atrium was severely dilated. - Right ventricle: The cavity size was normal. Wall thickness was normal. Systolic function was normal. - Atrial septum: No defect or patent foramen ovale was identified by color flow Doppler. - Tricuspid valve: There was mild regurgitation. - Pulmonary arteries: Systolic pressure was moderately to severely increased. PA peak pressure: 59 mm Hg (S).  Cardiac cath in March 2016 as per chart review from  outside facility. Last catheterization was March 2016."His EFwasnormal. He has a patent LIMA to the LAD with an occluded LAD beyond LIMA insertion. He has a patent vein graft to the right status post stenting of the PDA with a 2.5 mm Synergy drug-eluting stent to the vein graft and a diseased ramus branch was with not intervened on."  Patient Profile     72 y.o. male withapast medical history notable for CAD status post three-vessel CABG in 2000 with redo bypass in 2011 with multiple PCI's, CKD stage IV. He presented with unstable angina, cough was noted to have an elevated troponin without corresponding EKG changes. Patient is being treated for influenza A vs CAP pneumonia withassociatedcough, dyspnea, and generalized malaise.The patient continues to experience significant dyspnea which is to be expected given his influenza status.   Patient's chest pain the prior evening was concerning but the patient's post event troponin continued to demonstrate a downward trend from his previous event which in conjunction with his EKG only demonstrating A-fib, the team was reassured.    Assessment & Plan    Unstable angina/STEMI: Patient continues to improve symptomatically however, chest pain again occurred the previous evening.  This did not subside until he was given multiple doses of morphine and initiated on a nitroglycerin drip.  Records pulse was called due to increasing chest pain patient was transferred back to the ICU.  Repeat EKG this a.m. demonstrated normal sinus rhythm without ST depression or elevation.  The resolution of his symptoms of current changes emergent heart catheterization is not indicated.  Team considering a right heart cath in the chest pain does not again resolved and her symptoms worsen.  Continue to be convinced the patient is expressing significant demand ischemia from the influenza, chronic pulmonary disease, extensive coronary artery disease. -Trending troponin  enzymes -Repeat EKG stat for chest pain - Continue Imdur 120 mg daily -Continue with Toprol tartrate 50 mg twice daily -Continue ASA and Plavix -Continue Xanax as needed for anxiety -Wean nitroglycerin  drip based on protocol  -Sublingual NTG as needed for chest pain. - Ordered for Ranexa 500 mg twice daily  Atrial fibrillation: Patient experienced an episode of atrial fibrillation per telemetry to confirm an EKG.  This followed the onset of his chest pain resolved after being given a loading dose of amiodarone continue amiodarone IV. -Discontinuing amiodarone IV gtt -Initiated amiodarone 400 mg twice daily for 5 days to continue loading process.  Hypertension:  Patients blood pressures dropped significantly with nitroglycerin gtt but he continues to maintain a map greater than 75 and is asymptomatic.  We will consider discontinuing his hydralazine or decreasing the dose if patient's blood pressure drops significantly. -Continue amlodipine 10 mg daily -Continue beta blocker doxazosin - Continue hydralazine 70 mg twice daily  Influenza/As per primary team This is a significant component of the patient's respiratory distress and increased cardiac demand.  CKD IV: Patient's creatinine steadily increased to 2.94.  We will continue to monitor this but agree with continuation of the patient's furosemide 40 mg IV twice daily pulmonary infiltrates and worsening shortness of breath with exertion.    For questions or updates, please contact [facility] Please consult [email or web address] for contact info under Cardiology/STEMI.   Please see attending note/attestation for current assessment and plan.  Signed, Kathi Ludwig, MD  05/07/2017, 7:31 AM

## 2017-05-07 NOTE — Consult Note (Signed)
[facility] Face-to-Face Psychiatry Consult   Reason for Consult:  Depression  Referring Physician:  Dr. Ezenduka Patient Identification: Nathan Wolfe MRN:  [id number] Principal Diagnosis: Anxiety Diagnosis:   Patient Active Problem List   Diagnosis Date Noted  . Pulmonary edema [J81.1]   . Acute respiratory failure with hypoxia (HCC) [J96.01]   . Demand ischemia (HCC) [I24.8]   . Influenza A [J10.1]   . Unstable angina (HCC) [I20.0] 05/01/2017  . Anemia due to chronic kidney disease [N18.9, D63.1] 05/01/2017  . Essential hypertension [I10] 05/01/2017  . OSA on CPAP [G47.33, Z99.89] 05/01/2017  . Non-STEMI (non-ST elevated myocardial infarction) (HCC) [I21.4]     Total Time spent with patient: 1 hour  Subjective:   Nathan Wolfe is a 72 y.o. male patient admitted with unstable angina/STEMI.  HPI:   Per chart review, patient was admitted with unstable angina and found to have influenza A. Psychiatry was consulted for management of worsening depression and comments made about being "ready to go 6 feet under." He has mentioned being open to having a discussion about palliative care options since there are reportedly no surgical interventions to improve his cardiac function at this point. He would like to return to [place] to his home and spend time with his wife. Yesterday a consult was placed for psychiatry for anxiety management but it was discontinued after discussion with the primary team because it appeared to be situational and caused by his daughter when she visits him in the hospital. He has been unable to tolerate any activity. He becomes short of breath and has an anxiety attack which causes chest pain and further panic. He is prescribed Prozac 20 mg daily. He is receiving Xanax 0.5 mg TID PRN (x 2 doses over the past 24 hours) and Melatonin 3 mg qhs.   On interview, [name] reports that he was in 'bad shape" yesterday. He felt like he was having a heart attack and later found  out that he had atrial fibrillation. He reports feeling scared due to the chest pain. He explains that this is why he said he was "6 feet under" because he thought he was dying. He denies SI or any intention to harm himself. He wants to continue living. He wants to return to [place] to his home and wife. He continues to enjoy flying his plane and riding his motorcycle. He denies problems with sleep or appetite. He reports situational anxiety at times. He usually becomes anxious if he needs to have someone repair his plane. He has taken Prozac since 1989 for depression and anxiety. He has been up to 40 mg daily of Prozac. He does not feel like he need adjustments at this time.   Past Psychiatric History: Depression and anxiety. He denies a history of suicide attempts. He reports having guns at home although they are locked up and he does not have ammunition.   Risk to Self: Is patient at risk for suicide?: No Risk to Others:  None. Denies HI.  Prior Inpatient Therapy:  He was hospitalized 25 years ago for depression.  Prior Outpatient Therapy:  He has an outpatient provider in [place].   Past Medical History:  Past Medical History:  Diagnosis Date  . CAD (coronary artery disease)   . Carotid artery stenosis   . Chronic kidney disease (CKD), stage IV (severe) (HCC)   . Morbid obesity (HCC)   . OSA on CPAP     Past Surgical History:  Procedure Laterality Date  . CAROTID   ENDARTERECTOMY Left 09/2016  . CORONARY ARTERY BYPASS GRAFT     Family History: No family history on file. Family Psychiatric  History: Maternal uncle had mental health problems. Social History:  Social History   Substance and Sexual Activity  Alcohol Use Not on file     Social History   Substance and Sexual Activity  Drug Use Not on file    Social History   Socioeconomic History  . Marital status: Married    Spouse name: Not on file  . Number of children: Not on file  . Years of education: Not on file  .  Highest education level: Not on file  Occupational History  . Not on file  Social Needs  . Financial resource strain: Not on file  . Food insecurity:    Worry: Not on file    Inability: Not on file  . Transportation needs:    Medical: Not on file    Non-medical: Not on file  Tobacco Use  . Smoking status: Not on file  Substance and Sexual Activity  . Alcohol use: Not on file  . Drug use: Not on file  . Sexual activity: Not on file  Lifestyle  . Physical activity:    Days per week: Not on file    Minutes per session: Not on file  . Stress: Not on file  Relationships  . Social connections:    Talks on phone: Not on file    Gets together: Not on file    Attends religious service: Not on file    Active member of club or organization: Not on file    Attends meetings of clubs or organizations: Not on file    Relationship status: Not on file  Other Topics Concern  . Not on file  Social History Narrative  . Not on file   Additional Social History: He lives at home with his wife of 52 years. He has 2 daughters who live in [place]. 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 He reports rare alcohol use and denies illicit substance use.     Allergies:   Allergies  Allergen Reactions  . Statins Other (See Comments)    Full body muscle stuffiness/pain    Labs:  Results for orders placed or performed during the hospital encounter of 04/30/17 (from the past 48 hour(s))  Heparin level (unfractionated)     Status: Abnormal   Collection Time: 05/06/17  3:04 AM  Result Value Ref Range   Heparin Unfractionated 0.11 (L) 0.30 - 0.70 IU/mL    Comment:        IF HEPARIN RESULTS ARE BELOW EXPECTED VALUES, AND PATIENT DOSAGE HAS BEEN CONFIRMED, SUGGEST FOLLOW UP TESTING OF ANTITHROMBIN III LEVELS. Performed at [facility], [address] [place], [place] [address]   CBC     Status: Abnormal   Collection Time: 05/06/17  3:04 AM   Result Value Ref Range   WBC 6.5 4.0 - 10.5 K/uL   RBC 2.88 (L) 4.22 - 5.81 MIL/uL   Hemoglobin 9.4 (L) 13.0 - 17.0 g/dL   HCT 27.8 (L) 39.0 - 52.0 %   MCV 96.5 78.0 - 100.0 fL   MCH 32.6 26.0 - 34.0 pg   MCHC 33.8 30.0 - 36.0 g/dL   RDW 14.0 11.5 - 15.5 %   Platelets 152 150 - 400 K/uL    Comment: Performed at [facility], [address]   [place], [place] [address]  Procalcitonin     Status: None   Collection Time: 05/06/17  3:04 AM  Result Value Ref Range   Procalcitonin 1.97 ng/mL    Comment:        Interpretation: PCT > 0.5 ng/mL and <= 2 ng/mL: Systemic infection (sepsis) is possible, but other conditions are known to elevate PCT as well. (NOTE)       Sepsis PCT Algorithm           Lower Respiratory Tract                                      Infection PCT Algorithm    ----------------------------     ----------------------------         PCT < 0.25 ng/mL                PCT < 0.10 ng/mL         Strongly encourage             Strongly discourage   discontinuation of antibiotics    initiation of antibiotics    ----------------------------     -----------------------------       PCT 0.25 - 0.50 ng/mL            PCT 0.10 - 0.25 ng/mL               OR       >80% decrease in PCT            Discourage initiation of                                            antibiotics      Encourage discontinuation           of antibiotics    ----------------------------     -----------------------------         PCT >= 0.50 ng/mL              PCT 0.26 - 0.50 ng/mL                AND       <80% decrease in PCT             Encourage initiation of                                             antibiotics       Encourage continuation           of antibiotics    ----------------------------     -----------------------------        PCT >= 0.50 ng/mL                  PCT > 0.50 ng/mL               AND         increase in PCT                  Strongly encourage                                         initiation of antibiotics    Strongly encourage escalation           of antibiotics                                     -----------------------------                                           PCT <= 0.25 ng/mL                                                 OR                                        > 80% decrease in PCT                                     Discontinue / Do not initiate                                             antibiotics Performed at [facility], [address] [place], [place] [address]   Basic metabolic panel     Status: Abnormal   Collection Time: 05/06/17  3:04 AM  Result Value Ref Range   Sodium 133 (L) 135 - 145 mmol/L   Potassium 4.2 3.5 - 5.1 mmol/L   Chloride 101 101 - 111 mmol/L   CO2 22 22 - 32 mmol/L   Glucose, Bld 112 (H) 65 - 99 mg/dL   BUN 55 (H) 6 - 20 mg/dL   Creatinine, Ser 2.85 (H) 0.61 - 1.24 mg/dL   Calcium 8.6 (L) 8.9 - 10.3 mg/dL   GFR calc non Af Amer 20 (L) >60 mL/min   GFR calc Af Amer 24 (L) >60 mL/min    Comment: (NOTE) The eGFR has been calculated using the CKD EPI equation. This calculation has not been validated in all clinical situations. eGFR's persistently <60 mL/min signify possible Chronic Kidney Disease.    Anion gap 10 5 - 15    Comment: Performed at [facility] [address] [place], [place] [address]  Troponin I     Status: Abnormal   Collection Time: 05/06/17 10:37 PM  Result Value Ref Range   Troponin I 0.43 (HH) <0.03 ng/mL    Comment: CRITICAL RESULT CALLED TO, READ BACK BY AND VERIFIED WITH: PATEL,P RN 05/07/2017 0024 JORDANS Performed at [facility], [place] [address], [place], [place] [address]   I-STAT 3, arterial blood gas (G3+)     Status: Abnormal   Collection Time: 05/07/17 12:42 AM  Result Value Ref Range   pH, Arterial 7.368 7.350 - 7.450   pCO2 arterial 36.8 32.0 - 48.0 mmHg   pO2, Arterial 64.0 (L) 83.0 - 108.0 mmHg   Bicarbonate 21.3 20.0 - 28.0 mmol/L   TCO2 22 22  -  32 mmol/L   O2 Saturation 92.0 %   Acid-base deficit 4.0 (H) 0.0 - 2.0 mmol/L   Patient temperature 97.6 F    Sample type ARTERIAL   Basic metabolic panel     Status: Abnormal   Collection Time: 05/07/17  3:04 AM  Result Value Ref Range   Sodium 134 (L) 135 - 145 mmol/L   Potassium 4.2 3.5 - 5.1 mmol/L   Chloride 100 (L) 101 - 111 mmol/L   CO2 22 22 - 32 mmol/L   Glucose, Bld 196 (H) 65 - 99 mg/dL   BUN 63 (H) 6 - 20 mg/dL   Creatinine, Ser 2.94 (H) 0.61 - 1.24 mg/dL   Calcium 8.9 8.9 - 10.3 mg/dL   GFR calc non Af Amer 20 (L) >60 mL/min   GFR calc Af Amer 23 (L) >60 mL/min    Comment: (NOTE) The eGFR has been calculated using the CKD EPI equation. This calculation has not been validated in all clinical situations. eGFR's persistently <60 mL/min signify possible Chronic Kidney Disease.    Anion gap 12 5 - 15    Comment: Performed at [facility], [address] [place], [place] [address]  CBC     Status: Abnormal   Collection Time: 05/07/17  3:04 AM  Result Value Ref Range   WBC 8.5 4.0 - 10.5 K/uL   RBC 3.05 (L) 4.22 - 5.81 MIL/uL   Hemoglobin 9.8 (L) 13.0 - 17.0 g/dL   HCT 29.3 (L) 39.0 - 52.0 %   MCV 96.1 78.0 - 100.0 fL   MCH 32.1 26.0 - 34.0 pg   MCHC 33.4 30.0 - 36.0 g/dL   RDW 14.0 11.5 - 15.5 %   Platelets 212 150 - 400 K/uL    Comment: Performed at [facility], [address] [place], [place] [address]    Current Facility-Administered Medications  Medication Dose Route Frequency Provider Last Rate Last Dose  . 0.9 %  sodium chloride infusion  250 mL Intravenous PRN Concord, Tiffany, MD      . 0.9 %  sodium chloride infusion   Intravenous Continuous Rollingwood, Tiffany, MD      . acetaminophen (TYLENOL) tablet 650 mg  650 mg Oral Q4H PRN Smith, Rondell A, MD   650 mg at 05/05/17 2352  . allopurinol (ZYLOPRIM) tablet 100 mg  100 mg Oral Daily Smith, Rondell A, MD   100 mg at 05/07/17 0942  . ALPRAZolam (XANAX) tablet 0.5 mg  0.5 mg Oral TID PRN  Powell, A Caldwell Jr., MD   0.5 mg at 05/07/17 1000  . amiodarone (PACERONE) tablet 400 mg  400 mg Oral BID Harbrecht, Lawrence, MD      . amLODipine (NORVASC) tablet 10 mg  10 mg Oral Daily Roberts, Lindsay B, NP   10 mg at 05/07/17 0943  . aspirin EC tablet 81 mg  81 mg Oral QHS Smith, Rondell A, MD   81 mg at 05/07/17 0024  . benzonatate (TESSALON) capsule 100 mg  100 mg Oral TID PRN Powell, A Caldwell Jr., MD   100 mg at 05/03/17 2012  . brimonidine (ALPHAGAN) 0.2 % ophthalmic solution 1 drop  1 drop Both Eyes QODAY Smith, Rondell A, MD   1 drop at 05/05/17 0916  . ceFEPIme (MAXIPIME) 1 g in sodium chloride 0.9 % 100 mL IVPB  1 g Intravenous Q24H Mikhail, Maryann, DO   Stopped at 05/07/17 0115  . chlorhexidine (PERIDEX) 0.12 % solution 15   mL  15 mL Mouth Rinse BID Elodia Florence., MD   15 mL at 05/07/17 0944  . clopidogrel (PLAVIX) tablet 75 mg  75 mg Oral Daily Fuller Plan A, MD   75 mg at 05/07/17 0944  . diphenhydrAMINE (BENADRYL) capsule 25 mg  25 mg Oral QHS PRN Fuller Plan A, MD   25 mg at 05/05/17 2109  . doxazosin (CARDURA) tablet 4 mg  4 mg Oral Daily Fuller Plan A, MD   4 mg at 05/07/17 0944  . enoxaparin (LOVENOX) injection 30 mg  30 mg Subcutaneous Q24H Martinique, Peter M, MD   30 mg at 05/07/17 0943  . ezetimibe (ZETIA) tablet 10 mg  10 mg Oral Daily Reino Bellis B, NP   10 mg at 05/07/17 0942  . fenofibrate tablet 160 mg  160 mg Oral Daily Fuller Plan A, MD   160 mg at 05/07/17 0943  . FLUoxetine (PROZAC) capsule 20 mg  20 mg Oral Daily Tamala Julian, Rondell A, MD   20 mg at 05/07/17 0942  . furosemide (LASIX) injection 40 mg  40 mg Intravenous BID Elodia Florence., MD   40 mg at 05/07/17 0944  . gi cocktail (Maalox,Lidocaine,Donnatal)  30 mL Oral QID PRN Fuller Plan A, MD      . hydrALAZINE (APRESOLINE) injection 10 mg  10 mg Intravenous Q4H PRN Fuller Plan A, MD   10 mg at 05/01/17 1553  . hydrALAZINE (APRESOLINE) tablet 75 mg  75 mg Oral BID Reino Bellis B, NP   75 mg at 05/06/17 0936  . ipratropium-albuterol (DUONEB) 0.5-2.5 (3) MG/3ML nebulizer solution 3 mL  3 mL Nebulization Q6H PRN Elodia Florence., MD   3 mL at 05/06/17 2302  . ipratropium-albuterol (DUONEB) 0.5-2.5 (3) MG/3ML nebulizer solution 3 mL  3 mL Nebulization QID Cristal Ford, DO   3 mL at 05/07/17 0849  . isosorbide mononitrate (IMDUR) 24 hr tablet 120 mg  120 mg Oral Daily Martinique, Peter M, MD   120 mg at 05/07/17 0944  . MEDLINE mouth rinse  15 mL Mouth Rinse q12n4p Elodia Florence., MD   15 mL at 05/06/17 1808  . Melatonin TABS 3 mg  3 mg Oral QHS Fuller Plan A, MD   3 mg at 05/03/17 2131  . metoprolol tartrate (LOPRESSOR) tablet 50 mg  50 mg Oral BID Fuller Plan A, MD   50 mg at 05/07/17 0316  . morphine 2 MG/ML injection 2 mg  2 mg Intravenous Once Cheryln Manly, NP   Stopped at 05/05/17 1045  . morphine 4 MG/ML injection 2 mg  2 mg Intravenous Q2H PRN Fuller Plan A, MD   2 mg at 05/07/17 0048  . nitroGLYCERIN (NITROSTAT) SL tablet 0.4 mg  0.4 mg Sublingual Q5 min PRN Chriss Czar, MD   0.4 mg at 05/06/17 2144  . nitroGLYCERIN 50 mg in dextrose 5 % 250 mL (0.2 mg/mL) infusion  0-200 mcg/min Intravenous Titrated Larey Dresser, MD 27 mL/hr at 05/07/17 1000 90 mcg/min at 05/07/17 1000  . ondansetron (ZOFRAN) injection 4 mg  4 mg Intravenous Q6H PRN Fuller Plan A, MD   4 mg at 05/06/17 2229  . polyethylene glycol (MIRALAX / GLYCOLAX) packet 17 g  17 g Oral BID Elodia Florence., MD   17 g at 05/06/17 [phone]  . ranolazine (RANEXA) 12 hr tablet 500 mg  500 mg Oral BID Kathi Ludwig, MD      .  senna-docusate (Senokot-S) tablet 1 tablet  1 tablet Oral BID Ezenduka, Nkeiruka J, MD      . sodium chloride flush (NS) 0.9 % injection 3 mL  3 mL Intravenous Q12H Anson, Tiffany, MD   3 mL at 05/06/17 1213  . sodium chloride flush (NS) 0.9 % injection 3 mL  3 mL Intravenous PRN Collins, Tiffany, MD        Musculoskeletal: Strength &  Muscle Tone: decreased due to physical deconditioning.  Gait & Station: UTA since patient was lying in bed. Patient leans: N/A  Psychiatric Specialty Exam: Physical Exam  Nursing note and vitals reviewed. Constitutional: He is oriented to person, place, and time. He appears well-developed and well-nourished.  HENT:  Head: Normocephalic and atraumatic.  Neck: Normal range of motion.  Respiratory:  Using CPAP  Musculoskeletal: Normal range of motion.  Neurological: He is alert and oriented to person, place, and time.  Skin: No rash noted.  Psychiatric: He has a normal mood and affect. His speech is normal and behavior is normal. Judgment and thought content normal. Cognition and memory are normal.    Review of Systems  Cardiovascular: Negative for chest pain.  Gastrointestinal: Negative for abdominal pain, constipation, diarrhea, nausea and vomiting.  Psychiatric/Behavioral: Positive for depression. Negative for hallucinations, substance abuse and suicidal ideas. The patient does not have insomnia.   All other systems reviewed and are negative.   Blood pressure 123/69, pulse (!) 58, temperature 97.7 F (36.5 C), temperature source Oral, resp. rate 19, height 5' 7" (1.702 m), weight 123.1 kg (271 lb 6.2 oz), SpO2 96 %.Body mass index is 42.51 kg/m.  General Appearance: Fairly Groomed, elderly, Caucasian male, wearing a hospital gown and lying in bed with a CPAP. NAD.   Eye Contact:  Good  Speech:  Clear and Coherent and Normal Rate  Volume:  Normal  Mood:  "Better"  Affect:  Appropriate and Full Range  Thought Process:  Goal Directed, Linear and Descriptions of Associations: Intact  Orientation:  Full (Time, Place, and Person)  Thought Content:  Logical  Suicidal Thoughts:  No  Homicidal Thoughts:  No  Memory:  Immediate;   Good Recent;   Good Remote;   Good  Judgement:  Fair  Insight:  Fair  Psychomotor Activity:  Normal  Concentration:  Concentration: Good and Attention  Span: Good  Recall:  Good  Fund of Knowledge:  Good  Language:  Good  Akathisia:  No  Handed:  Right  AIMS (if indicated):   N/A  Assets:  Communication Skills Housing Intimacy Social Support  ADL's:  Impaired  Cognition:  WNL  Sleep:   Okay   Assessment:  Adonai Berhane is a 72 y.o. male who was admitted with unstable angina/STEMI. He made a comment that raised concern for risk of harm to self so psychiatry was consulted. He denies SI or any intention to harm self. He reports making a statement that was misperceived as wanting to harm self when he was having chest pain and thought that he was going to die. He is future oriented and reports many reasons to live. He declines medication adjustments at this time for mood. He does not warrant inpatient psychiatric hospitalization at this time.   Treatment Plan Summary: -Continue Prozac 20 mg daily for mood and anxiety. -Patient is psychiatrically cleared. Psychiatry will sign off on patient at this time. Please consult psychiatry again as needed.   Disposition: No evidence of imminent risk to self or others at present.     Patient does not meet criteria for psychiatric inpatient admission.  Jacqueline J Norman, DO 05/07/2017 11:47 AM 

## 2017-05-07 NOTE — Plan of Care (Signed)
Chest pain on telemetry floor resulting in transfer to ICU. No chest pain today, on NTG at 70mcg/min. In afib with RVR last night, converted with amiodarone, now on Po. Sr, QTC .50. Hesitant to cath due to renal function/co-morbid factors. Troponin increased to 3.76 today , awaiting serials.  Currently visiting here from [place], wife also in the hospital.  Plan : to continue treatment modalities, cluster of care , psych consult to evaluate .

## 2017-05-07 NOTE — Progress Notes (Signed)
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 Patient reports symptoms did not feel like acid reflux. Reports pain radiating into his jaw similar to previous heart attacks. Patient reports taking approximately 4 nitros with just temporary relief of symptoms before return of pain symptoms. In the ER, pt was noted to be flu positive, flat troponin, admitted for further management.  Overnight, rapid response team was alerted due to chest pain, with respiratory distress, increased work of breathing.  Chest x-ray showed possible pulmonary edema.  Patient was also noted to go into A. fib with RVR. Patient was noted to make several remarks such as such as "I am ready to go, just put a white sheet over me and send me in 6 feet under".  Today, patient appears more stable, back to sinus rhythm, rate controlled.  Afebrile, on nasal CPAP with good oxygenation.  Patient denies any further chest pain, worsening shortness of breath.  Reported some constipation.    Assessment/Plan: Active Problems:   Unstable angina (HCC)   Anemia due to chronic kidney disease   Essential hypertension   OSA on CPAP   Demand ischemia (HCC)   Influenza A   Acute respiratory failure with hypoxia (HCC)   Pulmonary edema  Sepsis 2/2 Influenza A with possible PNA Last temp 102.2 on 3/20, no luekocytosis BC X 2 NGTD, will repeat Procalcitonin trending upwards, will repeat Sputum cx with few Gram + cocci in clusters, few gram - rods. MRSA PCR negative CXR: with worsening pulm edema Vs  worsening PNA Completed tamiflu Continue cefepime, will start IV doxycycline due to ?influenza PNA, although MRSA PCR neg. Will hold off starting IV Vanc pending clinical condition as renal fxn worsening  Unstable angina/NSTEMI Chest pain overnight Extensive cardiac hx as mentioned above Trop 2.97-->1.95, currently uptrending to 3.76, EKG with t wave changes  ECHO with EF of 50-55%, Grade 2 DD, no RWMA Cardiology on board: Restarted nitro drip, added ranexa 500 mg BID. Still a poor candidate for cardiac cath due to marginal resp status, worsening CKD. Currently informed of rising trop, awaiting further management Continue ASA, plavix, imdur, metoprolol Monitor closely  Acute on chronic diastolic HF Repeat CXR with worsening pulm edema ECHO as above Continue diuresis with 40 mg lasix IV BID, monitor renal fxn closely Cardiology on board: If no response to diuresis, may perform RHC  CKD stage IV Unsure of baseline Creatinine of 3.4 on admission-->currently rising Follows with nephrology, not on HD Monitor closely as pt is on lasix  P.Afib Currently HR controlled Stopped IV amiodarone, switched to PO amiodarone Cardiology on board  Essential hypertension Stable Continue metoprolol (50 BID), hydralazine (increased to 75 BID), amlodipine (increased to 10), imdur, lasix  Anemia of chronic disease Stable, monitor closely  BPH Continue Cardura  Dyslipidemia Continue fenofibrate, started zetia  OSA on CPAP CPAP at night  Anxiety Continue xanax prn Psych consult placed     Code Status: Full  Family Communication: Daughter at bedside  Disposition Plan: Once work-up complete   Consultants:  Cardiology  Procedures:  None  Antimicrobials:  Cefepime  Azithromycin  Tamiflu   DVT prophylaxis:  Lovenox  Objective: Vitals:   05/07/17 1000 05/07/17 1120 05/07/17 1300 05/07/17 1400  BP: 123/69  134/75 137/73  Pulse: 63 (!) 58 61 65  Resp: 19  15     Temp:      TempSrc:      SpO2: 96%  100% 98%  Weight:      Height:        Intake/Output Summary (Last 24 hours) at 05/07/2017 1521 Last data filed at 05/07/2017 1300 Gross per 24 hour  Intake 1154.83 ml  Output 660 ml  Net 494.83 ml   Filed Weights   05/06/17 0400 05/07/17 0024 05/07/17 0324  Weight: 122.6 kg (270 lb 4.5 oz) 123.1 kg (271 lb 6.2 oz) 123.1 kg (271 lb 6.2 oz)    Exam:   General:  NAD   Cardiovascular: S1, S2 present  Respiratory: Mild wheezing noted bilaterally  Abdomen: Soft, NT, ND, BS present  Musculoskeletal: No pedal edema  Skin: Normal  Psychiatry: Anxious    Data Reviewed: CBC: Recent Labs  Lab 05/01/17 0004  05/03/17 0255 05/04/17 0303 05/05/17 0245 05/06/17 0304 05/07/17 0304  WBC 9.6   < > 7.9 6.9 6.2 6.5 8.5  NEUTROABS 7.1  --   --   --   --   --   --   HGB 12.7*   < > 10.7* 11.4* 10.0* 9.4* 9.8*  HCT 37.2*   < > 33.1* 34.4* 29.9* 27.8* 29.3*  MCV 97.6   < > 98.8 98.6 98.4 96.5 96.1  PLT 218   < > 161 144* 136* 152 212   < > = values in this interval not displayed.   Basic Metabolic Panel: Recent Labs  Lab 05/01/17 0803  05/02/17 1102 05/03/17 0255 05/04/17 0303 05/05/17 0245 05/06/17 0304 05/07/17 0304  NA  --    < >  --  133* 135 133* 133* 134*  K  --    < >  --  4.1 4.8 4.3 4.2 4.2  CL  --    < >  --  104 107 103 101 100*  CO2  --    < >  --  18* 16* 19* 22 22  GLUCOSE  --    < >  --  130* 105* 118* 112* 196*  BUN  --    < >  --  57* 54* 55* 55* 63*  CREATININE  --    < >  --  2.94* 2.72* 2.77* 2.85* 2.94*  CALCIUM  --    < >  --  8.4* 8.8* 8.6* 8.6* 8.9  MG 2.1  --  1.9 2.0 2.1 2.4  --   --    < > = values in this interval not displayed.   GFR: Estimated Creatinine Clearance: 28.1 mL/min (A) (by C-G formula based on SCr of 2.94 mg/dL (H)). Liver Function Tests: Recent Labs  Lab 05/01/17 0316 05/04/17 0303 05/05/17 0245  AST 22 42* 36  ALT 23 29 33  ALKPHOS 32* 24* 21*  BILITOT 0.5 0.9 0.9  PROT 6.0*  6.5 6.0*  ALBUMIN 3.3* 3.1* 2.7*   No results for input(s): LIPASE, AMYLASE in the last 168 hours. No results for input(s): AMMONIA in the last 168 hours. Coagulation Profile: Recent Labs  Lab 05/01/17 0157  INR 1.18   Cardiac Enzymes: Recent Labs  Lab 05/02/17 1852 05/03/17 0255 05/04/17 0303 05/06/17 2237 05/07/17 1102  TROPONINI 2.63* 2.97* 1.95* 0.43* 3.76*   BNP (last 3 results) No results for  input(s): PROBNP in the last 8760 hours. HbA1C: No results for input(s): HGBA1C in the last 72 hours. CBG: Recent Labs  Lab 05/02/17 0755  GLUCAP 133*   Lipid Profile: No results for input(s): CHOL, HDL, LDLCALC, TRIG, CHOLHDL, LDLDIRECT in the last 72 hours. Thyroid Function Tests: No results for input(s): TSH, T4TOTAL, FREET4, T3FREE, THYROIDAB in the last 72 hours. Anemia Panel: No results for input(s): VITAMINB12, FOLATE, FERRITIN, TIBC, IRON, RETICCTPCT in the last 72 hours. Urine analysis: No results found for: COLORURINE, APPEARANCEUR, LABSPEC, PHURINE, GLUCOSEU, HGBUR, BILIRUBINUR, KETONESUR, PROTEINUR, UROBILINOGEN, NITRITE, LEUKOCYTESUR Sepsis Labs: @LABRCNTIP (procalcitonin:4,lacticidven:4)  ) Recent Results (from the past 240 hour(s))  Culture, blood (routine x 2)     Status: None   Collection Time: 05/02/17  1:06 AM  Result Value Ref Range Status   Specimen Description BLOOD RIGHT HAND  Final   Special Requests   Final    BOTTLES DRAWN AEROBIC AND ANAEROBIC Blood Culture results may not be optimal due to an inadequate volume of blood received in culture bottles   Culture   Final    NO GROWTH 5 DAYS Performed at [facility], [place] [address], [place], [place] [address]    Report Status 05/07/2017 FINAL  Final  Culture, blood (routine x 2)     Status: None   Collection Time: 05/02/17  1:07 AM  Result Value Ref Range Status   Specimen Description BLOOD RIGHT HAND  Final   Special Requests   Final    BOTTLES DRAWN AEROBIC AND ANAEROBIC Blood Culture  results may not be optimal due to an inadequate volume of blood received in culture bottles   Culture   Final    NO GROWTH 5 DAYS Performed at [facility], [place] [address], [place], [place] [address]    Report Status 05/07/2017 FINAL  Final  MRSA PCR Screening     Status: None   Collection Time: 05/02/17  9:45 AM  Result Value Ref Range Status   MRSA by PCR NEGATIVE NEGATIVE Final    Comment:        The GeneXpert MRSA Assay (FDA approved for NASAL specimens only), is one component of a comprehensive MRSA colonization surveillance program. It is not intended to diagnose MRSA infection nor to guide or monitor treatment for MRSA infections. Performed at [facility], [place] [address], [place], [place] [address]   MRSA PCR Screening     Status: None   Collection Time: 05/04/17  5:15 PM  Result Value Ref Range Status   MRSA by PCR NEGATIVE NEGATIVE Final    Comment:        The GeneXpert MRSA Assay (FDA approved for NASAL specimens only), is one component of a comprehensive MRSA colonization surveillance program. It is not intended to diagnose MRSA infection nor to guide or monitor treatment for MRSA infections. Performed at [facility], [place] [address], [place], [place] [address]   Culture, expectorated sputum-assessment     Status: None   Collection Time: 05/04/17  6:06 PM  Result Value Ref Range Status   Specimen Description EXPECTORATED SPUTUM  Final   Special Requests NONE  Final   Sputum evaluation   Final    THIS SPECIMEN IS ACCEPTABLE FOR SPUTUM CULTURE Performed at [facility], [place] [address] [place], [place] [address]    Report Status 05/04/2017 FINAL  Final  Culture, respiratory (NON-Expectorated)     Status: None   Collection Time: 05/04/17  6:06 PM  Result Value Ref Range Status   Specimen Description EXPECTORATED SPUTUM  Final   Special Requests NONE Reflexed from [id number]  Final   Gram Stain   Final    FEW WBC PRESENT, PREDOMINANTLY  PMN FEW SQUAMOUS EPITHELIAL CELLS PRESENT FEW GRAM POSITIVE COCCI IN CLUSTERS FEW GRAM NEGATIVE RODS RARE GRAM POSITIVE RODS    Culture   Final    Consistent with normal respiratory flora. Performed at [facility], [place] [address] [place], [place] [address]    Report Status 05/07/2017 FINAL  Final  Culture, blood (routine x 2) Call MD if unable to obtain prior to antibiotics being given     Status: None (Preliminary result)   Collection Time: 05/04/17  8:30 PM  Result Value Ref Range Status   Specimen Description BLOOD RIGHT HAND  Final   Special Requests Blood Culture adequate volume BLOOD AEROBIC BOTTLE  Final   Culture   Final    NO GROWTH 3 DAYS Performed at [facility], [place] [address], [place], [place] [address]    Report Status PENDING  Incomplete  Culture, blood (routine x 2) Call MD if unable to obtain prior to antibiotics being given     Status: None (Preliminary result)   Collection Time: 05/04/17  8:35 PM  Result Value Ref Range Status   Specimen Description BLOOD RIGHT HAND  Final   Special Requests BLOOD AEROBIC BOTTLE Blood Culture adequate volume  Final   Culture   Final    NO GROWTH 3 DAYS Performed at [facility], [place] [address] [place], [place] [address]    Report Status PENDING  Incomplete      Studies: Dg Chest Port 1 View  Result Date: 05/07/2017 CLINICAL DATA:  Initial evaluation for acute respiratory distress, chest pain. EXAM: PORTABLE CHEST 1 VIEW COMPARISON:  Prior radiograph from earlier the same day. FINDINGS: Median sternotomy wires underlying CABG markers again noted. Stable cardiomegaly. Mediastinal silhouette normal. Lungs normally inflated. Slightly worsened diffuse pulmonary vascular congestion with interstitial prominence, consistent with pulmonary edema. New parenchymal infiltrates within the right upper and bilateral lower lobes, which may reflect edema and/or possibly infiltrates. Small right pleural effusion. No  pneumothorax. Osseous structures are unchanged. IMPRESSION: 1. Stable cardiomegaly with interval worsening in diffuse vascular and interstitial prominence, consistent with pulmonary edema. 2. New more confluent patchy right upper and bibasilar opacities, which may reflect edema and/or infiltrates. 3. Small right pleural effusion. Electronically Signed   By: Jeannine Boga M.D.   On: 05/07/2017 00:00    Scheduled Meds: . allopurinol  100 mg Oral Daily  . amiodarone  400 mg Oral BID  . amLODipine  10 mg Oral Daily  . aspirin EC  81 mg Oral QHS  . brimonidine  1 drop Both Eyes QODAY  . chlorhexidine  15 mL Mouth Rinse BID  . clopidogrel  75 mg Oral Daily  . doxazosin  4 mg Oral Daily  . enoxaparin (LOVENOX) injection  30 mg Subcutaneous Q24H  . ezetimibe  10 mg Oral Daily  . fenofibrate  160 mg Oral Daily  . FLUoxetine  20 mg Oral Daily  . furosemide  40 mg Intravenous BID  . hydrALAZINE  75 mg Oral BID  . ipratropium-albuterol  3 mL Nebulization QID  . isosorbide mononitrate  120 mg Oral Daily  . mouth rinse  15 mL Mouth Rinse q12n4p  . Melatonin  3 mg Oral QHS  . metoprolol tartrate  50 mg Oral BID  .  morphine injection  2 mg Intravenous Once  . polyethylene glycol  17 g Oral BID  . ranolazine  500 mg Oral BID  . senna-docusate  1 tablet Oral BID  . sodium chloride flush  3 mL Intravenous Q12H    Continuous Infusions: . sodium chloride    . sodium chloride    . ceFEPime (MAXIPIME) IV Stopped (05/07/17 0115)  . nitroGLYCERIN 80 mcg/min (05/07/17 1210)     LOS: 6 days     Alma Friendly, MD [facility]   If 7PM-7AM, please contact night-coverage [email or web address] Password [facility] 05/07/2017, 3:21 PM

## 2017-05-07 NOTE — Significant Event (Addendum)
Rapid Response Event Note  Overview: Chest Pain   Initial Focused Assessment: Called by charge RN about patient having chest pain that was uncontrolled, per RNs, patient has had 3 NTG SL 0.4MG  and received Morphine IV. RNs already paged CARDS MD on call, awaiting page back.  I arrived at 2225, patient was in respiratory distress, + crackles, increased WOB, RR 30-35, mild use of accessory muscle. Patient states when CP started if 5-6/10 now is 3/10. RNs started NTG infusion as well.  Patient was neuro intact, did follow commands, + diaphoretic/cool skin/clammy. SBP 90-130s, MAP > 65, HR in the 100-115 (ST), patient has not been tachycardiac, patient was placed on NRB 15L prior my arrival, I attempted to wean oxygen down twice but patient's WOB would increase. Patient has been and is very anxious, has had several remarks such as "I am ready to go, just put a white sheet over me and send me in 6 feet under".   Interventions: -- STAT CXR -- STAT ABG -- Lasix 80mg  IV x 1 -- DuoNeb x 1 -- BIPAP if needed -- Transfer back to CVICU -- I spoke CARDS MD on call, updated him, received the above orders. I also paged and updated TRH NP on call as well. Once in the ICU, patient was transitioned to VM 45% 10L and troponin was 0.43 (trending down from earlier).  Plan of Care:  -- Transferred to 2H14  Event Summary: Call Time 2216 Arrival Time 2225 End Time 0010  Nathan Wolfe R

## 2017-05-08 ENCOUNTER — Inpatient Hospital Stay (HOSPITAL_COMMUNITY): Payer: Medicare Other

## 2017-05-08 ENCOUNTER — Encounter (HOSPITAL_COMMUNITY): Payer: Self-pay

## 2017-05-08 LAB — CBC WITH DIFFERENTIAL/PLATELET
BASOS ABS: 0 10*3/uL (ref 0.0–0.1)
BASOS PCT: 1 %
EOS ABS: 0.7 10*3/uL (ref 0.0–0.7)
EOS PCT: 11 %
HCT: 29.2 % — ABNORMAL LOW (ref 39.0–52.0)
Hemoglobin: 9.8 g/dL — ABNORMAL LOW (ref 13.0–17.0)
Lymphocytes Relative: 13 %
Lymphs Abs: 0.8 10*3/uL (ref 0.7–4.0)
MCH: 32.2 pg (ref 26.0–34.0)
MCHC: 33.6 g/dL (ref 30.0–36.0)
MCV: 96.1 fL (ref 78.0–100.0)
MONO ABS: 0.7 10*3/uL (ref 0.1–1.0)
Monocytes Relative: 12 %
Neutro Abs: 3.8 10*3/uL (ref 1.7–7.7)
Neutrophils Relative %: 63 %
PLATELETS: 228 10*3/uL (ref 150–400)
RBC: 3.04 MIL/uL — ABNORMAL LOW (ref 4.22–5.81)
RDW: 14 % (ref 11.5–15.5)
WBC: 6.1 10*3/uL (ref 4.0–10.5)

## 2017-05-08 LAB — BASIC METABOLIC PANEL
Anion gap: 15 (ref 5–15)
BUN: 72 mg/dL — ABNORMAL HIGH (ref 6–20)
CO2: 21 mmol/L — ABNORMAL LOW (ref 22–32)
CREATININE: 3.22 mg/dL — AB (ref 0.61–1.24)
Calcium: 9.2 mg/dL (ref 8.9–10.3)
Chloride: 99 mmol/L — ABNORMAL LOW (ref 101–111)
GFR calc Af Amer: 20 mL/min — ABNORMAL LOW (ref >60)
GFR, EST NON AFRICAN AMERICAN: 18 mL/min — AB (ref >60)
Glucose, Bld: 127 mg/dL — ABNORMAL HIGH (ref 65–99)
POTASSIUM: 4.5 mmol/L (ref 3.5–5.1)
SODIUM: 135 mmol/L (ref 135–145)

## 2017-05-08 LAB — TROPONIN I
TROPONIN I: 2.62 ng/mL — AB (ref <0.03)
TROPONIN I: 2.5 ng/mL — AB (ref <0.03)
Troponin I: 3.32 ng/mL (ref <0.03)

## 2017-05-08 LAB — PROCALCITONIN: PROCALCITONIN: 1.76 ng/mL

## 2017-05-08 MED ORDER — IPRATROPIUM-ALBUTEROL 0.5-2.5 (3) MG/3ML IN SOLN
3.0000 mL | RESPIRATORY_TRACT | Status: DC | PRN
  Administered 2017-05-08 – 2017-05-10 (×3): 3 mL via RESPIRATORY_TRACT
  Filled 2017-05-08 (×6): qty 3

## 2017-05-08 MED ORDER — ACETYLCYSTEINE 20 % IN SOLN
3.0000 mL | Freq: Three times a day (TID) | RESPIRATORY_TRACT | Status: DC
Start: 2017-05-08 — End: 2017-05-11
  Administered 2017-05-08: 4 mL via RESPIRATORY_TRACT
  Administered 2017-05-09 – 2017-05-10 (×2): 3 mL via RESPIRATORY_TRACT
  Filled 2017-05-08 (×7): qty 4

## 2017-05-08 MED ORDER — RANOLAZINE ER 500 MG PO TB12
1000.0000 mg | ORAL_TABLET | Freq: Two times a day (BID) | ORAL | Status: DC
  Administered 2017-05-08 – 2017-05-13 (×11): 1000 mg via ORAL
  Filled 2017-05-08 (×11): qty 2

## 2017-05-08 MED ORDER — DOXYCYCLINE HYCLATE 100 MG PO TABS
100.0000 mg | ORAL_TABLET | Freq: Two times a day (BID) | ORAL | Status: DC
  Administered 2017-05-08 – 2017-05-13 (×10): 100 mg via ORAL
  Filled 2017-05-08 (×11): qty 1

## 2017-05-08 MED ORDER — ACETYLCYSTEINE 10% NICU INHALATION SOLUTION: 2.0000 mL | Freq: Three times a day (TID) | RESPIRATORY_TRACT | Status: DC

## 2017-05-08 NOTE — Plan of Care (Signed)
Will continue current plan of care. PRN xanax helped last night w/pt's anxiety. Continuing to monitor pt for s/sx of depression and risk for suicide (currently low risk, not verbalizing SI at this time); Psych is following pt as well. Will focus on progressing mobility, promoting good sleep hygiene, and education about condition/plan of care/symptom management during this shift.

## 2017-05-08 NOTE — Care Management Note (Signed)
Case Management Note Marvetta Gibbons RN, BSN Unit 4E-Case Manager- [name] coverage [phone]  Patient Details  Name: Nathan Wolfe MRN: [id number] Date of Birth: 01-09-1946  Subjective/Objective:   Pt admitted with USA/STEMI, +Flu A/CAP                 Action/Plan: PTA pt lived at home with spouse, CM to follow for transition of care needs.   Expected Discharge Date:                  Expected Discharge Plan:  Home/Self Care  In-House Referral:  Hospice / Palliative Care  Discharge planning Services  CM Consult  Post Acute Care Choice:    Choice offered to:     DME Arranged:    DME Agency:     HH Arranged:    HH Agency:     Status of Service:  In process, will continue to follow  If discussed at Long Length of Stay Meetings, dates discussed:    Discharge Disposition:   Additional Comments:  05/08/17- 1400- Marvetta Gibbons RN, CM-  Pt tx back to ICU for further tx- is here visiting daughter from [place] he and his wife both got sick with flu- per daughter wife is to d/c home today from hospital- pt has more complicated stay with heart and renal issues. Pt and wife live in [place], have 2 daughters here in [place], one here local and one in [place]. Per conversation with daughter whom pt was staying with here- pt and wife would prefer to get back to [facility]- however understand pt may need more time to recover prior to traveling back safely to [place]. They are discussing the options of home with daughter and HH vs possible STSNF here- also want to have discussion with PC (consult is pending) to talk about PC vs hospice. Daughter is very realalistic and wants to do what her parents want but also what is best for her dad. Explained to daughter that CM would follow for transition needs as pt improved - provided daughter with CM contact info. Pt will need PT/OT evals when medically ready to assist with recommendations for discharge.   Dawayne Patricia, RN 05/08/2017, 2:34 PM

## 2017-05-08 NOTE — Progress Notes (Signed)
Progress Note  Patient Name: Nathan Wolfe Date of Encounter: 05/08/2017  Primary Cardiologist: Quay Burow, MD   Subjective   The patient was again sitting in his bed today upon entering the room. He stated that he had been without chest pain since the pain on 3/20-21. His breathing has improved.  Inpatient Medications    Scheduled Meds: . allopurinol  100 mg Oral Daily  . amiodarone  400 mg Oral BID  . amLODipine  10 mg Oral Daily  . aspirin EC  81 mg Oral QHS  . brimonidine  1 drop Both Eyes QODAY  . chlorhexidine  15 mL Mouth Rinse BID  . clopidogrel  75 mg Oral Daily  . doxazosin  4 mg Oral Daily  . enoxaparin (LOVENOX) injection  30 mg Subcutaneous Q24H  . ezetimibe  10 mg Oral Daily  . fenofibrate  160 mg Oral Daily  . FLUoxetine  20 mg Oral Daily  . furosemide  40 mg Intravenous BID  . hydrALAZINE  75 mg Oral BID  . ipratropium-albuterol  3 mL Nebulization QID  . isosorbide mononitrate  120 mg Oral Daily  . mouth rinse  15 mL Mouth Rinse q12n4p  . Melatonin  3 mg Oral QHS  . metoprolol tartrate  50 mg Oral BID  .  morphine injection  2 mg Intravenous Once  . polyethylene glycol  17 g Oral BID  . ranolazine  1,000 mg Oral BID  . senna-docusate  1 tablet Oral BID  . sodium chloride flush  3 mL Intravenous Q12H   Continuous Infusions: . sodium chloride    . sodium chloride 10 mL/hr at 05/08/17 0700  . ceFEPime (MAXIPIME) IV Stopped (05/07/17 2223)  . doxycycline (VIBRAMYCIN) IV Stopped (05/08/17 1054)  . nitroGLYCERIN 15 mcg/min (05/08/17 0800)   PRN Meds: sodium chloride, acetaminophen, ALPRAZolam, benzonatate, diphenhydrAMINE, gi cocktail, hydrALAZINE, ipratropium-albuterol, morphine injection, nitroGLYCERIN, ondansetron (ZOFRAN) IV, sodium chloride flush   Vital Signs    Vitals:   05/08/17 0726 05/08/17 0800 05/08/17 0851 05/08/17 1208  BP:  123/61    Pulse:  (!) 55    Resp:  18    Temp: 98 F (36.7 C)   (!) 97.2 F (36.2 C)  TempSrc: Oral    Oral  SpO2:  100% 99%   Weight:      Height:        Intake/Output Summary (Last 24 hours) at 05/08/2017 1246 Last data filed at 05/08/2017 0800 Gross per 24 hour  Intake 1219.15 ml  Output 845 ml  Net 374.15 ml   Filed Weights   05/07/17 0024 05/07/17 0324 05/08/17 0500  Weight: 123.1 kg (271 lb 6.2 oz) 123.1 kg (271 lb 6.2 oz) 122.6 kg (270 lb 4.5 oz)    Telemetry    PVC's - Personally Reviewed  ECG    NSR. Persistent ST depression in the inferolateral leads, no notable changes from his previosu studies - Personally Reviewed  Physical Exam   GEN: No acute distress. Alert. Oriented x3 Neck: No JVD. Cardiac: RRR, no murmurs, rubs, or gallops.  Respiratory: Mild crackles bilaterally in the lower lung fields.  GI: Soft, nontender, non-distended. MS: No edema; No deformity.  Neuro:  Nonfocal  Psych: Normal affect   Labs    Chemistry Recent Labs  Lab 05/04/17 0303 05/05/17 0245 05/06/17 0304 05/07/17 0304 05/08/17 0259  NA 135 133* 133* 134* 135  K 4.8 4.3 4.2 4.2 4.5  CL 107 103 101 100* 99*  CO2 16*  19* 22 22 21*  GLUCOSE 105* 118* 112* 196* 127*  BUN 54* 55* 55* 63* 72*  CREATININE 2.72* 2.77* 2.85* 2.94* 3.22*  CALCIUM 8.8* 8.6* 8.6* 8.9 9.2  PROT 6.5 6.0*  --   --   --   ALBUMIN 3.1* 2.7*  --   --   --   AST 42* 36  --   --   --   ALT 29 33  --   --   --   ALKPHOS 24* 21*  --   --   --   BILITOT 0.9 0.9  --   --   --   GFRNONAA 22* 21* 20* 20* 18*  GFRAA 25* 25* 24* 23* 20*  ANIONGAP 12 11 10 12 15      Hematology Recent Labs  Lab 05/06/17 0304 05/07/17 0304 05/08/17 0259  WBC 6.5 8.5 6.1  RBC 2.88* 3.05* 3.04*  HGB 9.4* 9.8* 9.8*  HCT 27.8* 29.3* 29.2*  MCV 96.5 96.1 96.1  MCH 32.6 32.1 32.2  MCHC 33.8 33.4 33.6  RDW 14.0 14.0 14.0  PLT 152 212 228    Cardiac Enzymes Recent Labs  Lab 05/07/17 1102 05/07/17 1542 05/07/17 2202 05/08/17 0833  TROPONINI 3.76* 4.22* 4.49* 3.32*   No results for input(s): TROPIPOC in the last 168  hours.   BNPNo results for input(s): BNP, PROBNP in the last 168 hours.   DDimer No results for input(s): DDIMER in the last 168 hours.   Radiology    Dg Chest Port 1 View  Result Date: 05/08/2017 CLINICAL DATA:  Pulmonary edema EXAM: PORTABLE CHEST 1 VIEW COMPARISON:  05/06/2017 FINDINGS: Cardiac shadow remains enlarged. Postsurgical changes are again seen. Vascular congestion is again noted. The patchy infiltrative changes are again seen and stable. Small right pleural effusion remains. No acute bony abnormality is seen. IMPRESSION: Vascular congestion and patchy infiltrative changes stable from the previous exam. Electronically Signed   By: Inez Catalina M.D.   On: 05/08/2017 08:29   Dg Chest Port 1 View  Result Date: 05/07/2017 CLINICAL DATA:  Initial evaluation for acute respiratory distress, chest pain. EXAM: PORTABLE CHEST 1 VIEW COMPARISON:  Prior radiograph from earlier the same day. FINDINGS: Median sternotomy wires underlying CABG markers again noted. Stable cardiomegaly. Mediastinal silhouette normal. Lungs normally inflated. Slightly worsened diffuse pulmonary vascular congestion with interstitial prominence, consistent with pulmonary edema. New parenchymal infiltrates within the right upper and bilateral lower lobes, which may reflect edema and/or possibly infiltrates. Small right pleural effusion. No pneumothorax. Osseous structures are unchanged. IMPRESSION: 1. Stable cardiomegaly with interval worsening in diffuse vascular and interstitial prominence, consistent with pulmonary edema. 2. New more confluent patchy right upper and bibasilar opacities, which may reflect edema and/or infiltrates. 3. Small right pleural effusion. Electronically Signed   By: Jeannine Boga M.D.   On: 05/07/2017 00:00    Cardiac Studies   Echo3/17/19: Study Conclusions  - Left ventricle: The cavity size was normal. Wall thickness was increased in a pattern of mild LVH. Systolic function was  normal. The estimated ejection fraction was in the range of 50% to 55%. Mild hypokinesis of the anteroseptal myocardium. Features are consistent with a pseudonormal left ventricular filling pattern, with concomitant abnormal relaxation and increased filling pressure (grade 2 diastolic dysfunction). Doppler parameters are consistent with high ventricular filling pressure. - Aortic valve: Transvalvular velocity was within the normal range. There was no stenosis. There was no regurgitation. - Mitral valve: Transvalvular velocity was within the normal range. There  was no evidence for stenosis. There was trivial regurgitation. - Left atrium: The atrium was severely dilated. - Right ventricle: The cavity size was normal. Wall thickness was normal. Systolic function was normal. - Atrial septum: No defect or patent foramen ovale was identified by color flow Doppler. - Tricuspid valve: There was mild regurgitation. - Pulmonary arteries: Systolic pressure was moderately to severely increased. PA peak pressure: 59 mm Hg (S).  Cardiac cath in March 2016 as per chart review from outside facility. Last catheterization was March 2016."His EFwasnormal. He has a patent LIMA to the LAD with an occluded LAD beyond LIMA insertion. He has a patent vein graft to the right status post stenting of the PDA with a 2.5 mm Synergy drug-eluting stent to the vein graft and a diseased ramus branch was with not intervened on."  Patient Profile     72 y.o. male withapast medical history notable for CAD status post three-vessel CABG in 2000 with redo bypass in 2011 with multiple PCI's, CKD stage IV. He presented with unstable angina, cough was noted to have an elevated troponin without corresponding EKG changes. Patient is being treated for influenza A vs CAP pneumonia withassociatedcough, dyspnea, and generalized malaise.The patient continues to experience significant dyspnea which is  to be expected given his influenza status.  Patient's chest pain the prior evening was concerning but the patient's post event troponin continued to demonstrate a downward trend from his previous event which in conjunction with his EKG only demonstrating A-fib, the team was reassured.   Assessment & Plan    Unstable angina/STEMI: Patient continues to improve symptomatically again today. He denied chest pain overnight. His troponin trend demonstrated a decrease this am to 3.32 from a peak of 4.49. He remained on the nitro gtt overnight which had been tapered down by his nurse. He stated that he was able to get to the bedside commode without chest pain.  --Troponin trend continued --Repeat EKG stat for chest pain --Continue IMDUR 120mg  daily --Continue Ranexa but at 1000mg  BID --Continue xanax PRN for anxiety  --Continue ASA and Plavix --Wean from nitro gtt --Sublingual NTH PRN for chest pain  A-fib: Patient experienced an episode of A-fib the night of 3/20-21. This resolved with Amiodarone IV. HE was loaded and then started on PO Amiodarone 400mg  BID for 5 days total to complete the loading process. PO initiated 05/07/2017--until 05/11/2017. Continue PO amiodarone 400mg  BID, will deescalate after loading complete  HTN: Pressure stable.  -Continue amlodipine 10 mg daily -Continue beta blocker doxazosin -Continue hydralazine 70 mg twice daily  Influenza:  As per primary team. Treatment x 5 days complete.   CKD IV: Cr increasing. Diuresis improving. The patient's weight has remained elevated since admission. He continues to diurese but at a much lower rate. Would recommend concentrating his IV medications vs switching to PO meds such as with the Doxycycline. Please consider his worsening renal function in conjunction with the IV diuretics. His respiratory status appears to have improved clinically despite the lack of notable diuresis. I would suggest switching to PO furosemide.  For  questions or updates, please contact [place] Please consult [email or web address] for contact info under Cardiology/STEMI.    Signed, Kathi Ludwig, MD  05/08/2017, 12:46 PM

## 2017-05-08 NOTE — Progress Notes (Signed)
Notified cards fellow of critical troponin 4.49 from 22:00. Expected value, will continue to trend. Pt denies any CP.  Will continue to monitor.

## 2017-05-08 NOTE — Progress Notes (Signed)
Evening troponin 2.50. Expected value, continues to trend down. Will continue to monitor.

## 2017-05-08 NOTE — Progress Notes (Signed)
PROGRESS NOTE  Trust Leh ZOX:[id number] DOB: 1945-08-19 DOA: 04/30/2017 PCP: Patient, No Pcp Per  HPI/Recap of past 24 hours: Nathan Wolfe a 72 y.o.malewithcomplicated cardiac history including CADs/pCABG x2 and multiple stents, carotid artery stenosis s/pleft carotid endarterectomy 09/2016, HTN, HLD,CKD stage IVnot on hemodialysis,OSA on CPAP,and morbid obesity;who presents with complaints of acute onset of left-sided chest pain while sitting watching TV PTA. Patient reports symptoms did not feel like acid reflux. Reports pain radiating into his jaw similar to previous heart attacks. Patient reports taking approximately 4 nitros with just temporary relief of symptoms before return of pain symptoms. In the ER, pt was noted to be flu positive, flat troponin, admitted for further management.  Today, pt noted with some improvement, no further CP episodes, dyspnea improving, still with cough, no fever/chills noted.   Assessment/Plan: Principal Problem:   Anxiety Active Problems:   Unstable angina (HCC)   Anemia due to chronic kidney disease   Essential hypertension   OSA on CPAP   NSTEMI (non-ST elevated myocardial infarction) ([place])   Demand ischemia (HCC)   Influenza A   Acute respiratory failure with hypoxia (HCC)   Pulmonary edema  Sepsis 2/2 Influenza A with possible PNA Last temp 102.2 on 3/20, no luekocytosis BC X 2 NGTD, will repeat Procalcitonin trending downwards Sputum cx with few Gram + cocci in clusters, few gram - rods. MRSA PCR negative CXR: with stable pulm edema Vs PNA Completed tamiflu Continue cefepime, doxycycline due to ?influenza PNA, although MRSA PCR neg  Will hold off starting IV Vanc pending clinical condition as renal fxn worsening  Unstable angina/NSTEMI No further chest pain Extensive cardiac hx as mentioned above Trop peaked at 4.49, now down trending, EKG with t wave changes  ECHO with EF of 50-55%, Grade 2 DD, no  RWMA Cardiology on board: Restarted nitro drip, added ranexa 500 mg BID. Still a poor candidate for cardiac cath due to extensive prior occlusions, with a low chance of successful revascularization, marginal resp status, worsening CKD Continue ASA, plavix, imdur, metoprolol Monitor closely  Acute on chronic diastolic HF Repeat CXR with stable pulm edema ECHO as above Continue diuresis with 40 mg lasix IV BID, monitor renal fxn closely Cardiology on board  AKI on CKD stage IV Creatinine of 3.4 on admission-->currently rising Follows with nephrology in [place], not on HD Cardiology on board: plan to adjust or stop lasix pending am renal fxn  May consider nephrology consult  P.Afib Currently HR controlled Stopped IV amiodarone, switched to PO amiodarone Cardiology on board  Essential hypertension Stable Continue metoprolol (50 BID), hydralazine (increased to 75 BID), amlodipine (increased to 10), imdur, lasix  Anemia of chronic disease Stable, monitor closely  BPH Continue Cardura  Dyslipidemia Continue fenofibrate, started zetia  OSA on CPAP CPAP at night  Anxiety Continue xanax prn Psych consulted, no further recs  [facility] Palliative consulted     Code Status: Full  Family Communication: Daughters at bedside  Disposition Plan: Once work-up complete   Consultants:  Cardiology  Procedures:  None  Antimicrobials:  Cefepime  Doxcycline   Tamiflu   DVT prophylaxis:  Lovenox   Objective: Vitals:   05/08/17 1915 05/08/17 2000 05/08/17 2039 05/08/17 2100  BP:  (!) 142/73  (!) 148/75  Pulse: 63 63 63 69  Resp: (!) 25 (!) 23 (!) 25 (!) 32  Temp:  97.7 F (36.5 C)    TempSrc:  Oral    SpO2: 97% 99%  98%  Weight:  Height:        Intake/Output Summary (Last 24 hours) at 05/08/2017 2110 Last data filed at 05/08/2017 2108 Gross per 24 hour  Intake 1698.15 ml  Output 1805 ml  Net -106.85 ml   Filed Weights   05/07/17 0024 05/07/17 0324  05/08/17 0500  Weight: 123.1 kg (271 lb 6.2 oz) 123.1 kg (271 lb 6.2 oz) 122.6 kg (270 lb 4.5 oz)    Exam:   General:  NAD   Cardiovascular: S1, S2 present  Respiratory: Mild crackles noted b/l at the bases  Abdomen: Soft, NT, ND, BS present  Musculoskeletal: No pedal edema  Skin: Normal  Psychiatry: Anxious    Data Reviewed: CBC: Recent Labs  Lab 05/04/17 0303 05/05/17 0245 05/06/17 0304 05/07/17 0304 05/08/17 0259  WBC 6.9 6.2 6.5 8.5 6.1  NEUTROABS  --   --   --   --  3.8  HGB 11.4* 10.0* 9.4* 9.8* 9.8*  HCT 34.4* 29.9* 27.8* 29.3* 29.2*  MCV 98.6 98.4 96.5 96.1 96.1  PLT 144* 136* 152 212 053   Basic Metabolic Panel: Recent Labs  Lab 05/02/17 1102 05/03/17 0255 05/04/17 0303 05/05/17 0245 05/06/17 0304 05/07/17 0304 05/08/17 0259  NA  --  133* 135 133* 133* 134* 135  K  --  4.1 4.8 4.3 4.2 4.2 4.5  CL  --  104 107 103 101 100* 99*  CO2  --  18* 16* 19* 22 22 21*  GLUCOSE  --  130* 105* 118* 112* 196* 127*  BUN  --  57* 54* 55* 55* 63* 72*  CREATININE  --  2.94* 2.72* 2.77* 2.85* 2.94* 3.22*  CALCIUM  --  8.4* 8.8* 8.6* 8.6* 8.9 9.2  MG 1.9 2.0 2.1 2.4  --   --   --    GFR: Estimated Creatinine Clearance: 25.6 mL/min (A) (by C-G formula based on SCr of 3.22 mg/dL (H)). Liver Function Tests: Recent Labs  Lab 05/04/17 0303 05/05/17 0245  AST 42* 36  ALT 29 33  ALKPHOS 24* 21*  BILITOT 0.9 0.9  PROT 6.5 6.0*  ALBUMIN 3.1* 2.7*   No results for input(s): LIPASE, AMYLASE in the last 168 hours. No results for input(s): AMMONIA in the last 168 hours. Coagulation Profile: No results for input(s): INR, PROTIME in the last 168 hours. Cardiac Enzymes: Recent Labs  Lab 05/07/17 1542 05/07/17 2202 05/08/17 0833 05/08/17 1520 05/08/17 1833  TROPONINI 4.22* 4.49* 3.32* 2.62* 2.50*   BNP (last 3 results) No results for input(s): PROBNP in the last 8760 hours. HbA1C: No results for input(s): HGBA1C in the last 72 hours. CBG: Recent Labs    Lab 05/02/17 0755  GLUCAP 133*   Lipid Profile: No results for input(s): CHOL, HDL, LDLCALC, TRIG, CHOLHDL, LDLDIRECT in the last 72 hours. Thyroid Function Tests: No results for input(s): TSH, T4TOTAL, FREET4, T3FREE, THYROIDAB in the last 72 hours. Anemia Panel: No results for input(s): VITAMINB12, FOLATE, FERRITIN, TIBC, IRON, RETICCTPCT in the last 72 hours. Urine analysis: No results found for: COLORURINE, APPEARANCEUR, LABSPEC, PHURINE, GLUCOSEU, HGBUR, BILIRUBINUR, KETONESUR, PROTEINUR, UROBILINOGEN, NITRITE, LEUKOCYTESUR Sepsis Labs: @LABRCNTIP (procalcitonin:4,lacticidven:4)  ) Recent Results (from the past 240 hour(s))  Culture, blood (routine x 2)     Status: None   Collection Time: 05/02/17  1:06 AM  Result Value Ref Range Status   Specimen Description BLOOD RIGHT HAND  Final   Special Requests   Final    BOTTLES DRAWN AEROBIC AND ANAEROBIC Blood Culture results may not be  optimal due to an inadequate volume of blood received in culture bottles   Culture   Final    NO GROWTH 5 DAYS Performed at [facility], [place] [address] [place], [place] [address]    Report Status 05/07/2017 FINAL  Final  Culture, blood (routine x 2)     Status: None   Collection Time: 05/02/17  1:07 AM  Result Value Ref Range Status   Specimen Description BLOOD RIGHT HAND  Final   Special Requests   Final    BOTTLES DRAWN AEROBIC AND ANAEROBIC Blood Culture results may not be optimal due to an inadequate volume of blood received in culture bottles   Culture   Final    NO GROWTH 5 DAYS Performed at [facility], [place] [address], [place], [place] [address]    Report Status 05/07/2017 FINAL  Final  MRSA PCR Screening     Status: None   Collection Time: 05/02/17  9:45 AM  Result Value Ref Range Status   MRSA by PCR NEGATIVE NEGATIVE Final    Comment:        The GeneXpert MRSA Assay (FDA approved for NASAL specimens only), is one component of a comprehensive MRSA  colonization surveillance program. It is not intended to diagnose MRSA infection nor to guide or monitor treatment for MRSA infections. Performed at [facility], [place] [address], [place], [place] [address]   MRSA PCR Screening     Status: None   Collection Time: 05/04/17  5:15 PM  Result Value Ref Range Status   MRSA by PCR NEGATIVE NEGATIVE Final    Comment:        The GeneXpert MRSA Assay (FDA approved for NASAL specimens only), is one component of a comprehensive MRSA colonization surveillance program. It is not intended to diagnose MRSA infection nor to guide or monitor treatment for MRSA infections. Performed at [facility], [place] [address] [place], [place] [address]   Culture, expectorated sputum-assessment     Status: None   Collection Time: 05/04/17  6:06 PM  Result Value Ref Range Status   Specimen Description EXPECTORATED SPUTUM  Final   Special Requests NONE  Final   Sputum evaluation   Final    THIS SPECIMEN IS ACCEPTABLE FOR SPUTUM CULTURE Performed at [facility], [place] [address] [place], [place] [address]    Report Status 05/04/2017 FINAL  Final  Culture, respiratory (NON-Expectorated)     Status: None   Collection Time: 05/04/17  6:06 PM  Result Value Ref Range Status   Specimen Description EXPECTORATED SPUTUM  Final   Special Requests NONE Reflexed from [id number]  Final   Gram Stain   Final    FEW WBC PRESENT, PREDOMINANTLY PMN FEW SQUAMOUS EPITHELIAL CELLS PRESENT FEW GRAM POSITIVE COCCI IN CLUSTERS FEW GRAM NEGATIVE RODS RARE GRAM POSITIVE RODS    Culture   Final    Consistent with normal respiratory flora. Performed at [facility], [place] [address], [place], [place] [address]    Report Status 05/07/2017 FINAL  Final  Culture, blood (routine x 2) Call MD if unable to obtain prior to antibiotics being given     Status: None (Preliminary result)   Collection Time: 05/04/17  8:30 PM  Result Value Ref Range Status   Specimen  Description BLOOD RIGHT HAND  Final   Special Requests Blood Culture adequate volume BLOOD AEROBIC BOTTLE  Final   Culture   Final  NO GROWTH 4 DAYS Performed at [facility], [place] [address], [place], [place] [address]    Report Status PENDING  Incomplete  Culture, blood (routine x 2) Call MD if unable to obtain prior to antibiotics being given     Status: None (Preliminary result)   Collection Time: 05/04/17  8:35 PM  Result Value Ref Range Status   Specimen Description BLOOD RIGHT HAND  Final   Special Requests BLOOD AEROBIC BOTTLE Blood Culture adequate volume  Final   Culture   Final    NO GROWTH 4 DAYS Performed at [facility], [place] [address], [place], [place] [address]    Report Status PENDING  Incomplete  Culture, blood (routine x 2)     Status: None (Preliminary result)   Collection Time: 05/07/17  4:38 PM  Result Value Ref Range Status   Specimen Description BLOOD RIGHT HAND  Final   Special Requests   Final    BOTTLES DRAWN AEROBIC ONLY Blood Culture adequate volume   Culture   Final    NO GROWTH < 24 HOURS Performed at [facility], [place] [address] [place], [place] [address]    Report Status PENDING  Incomplete  Culture, blood (routine x 2)     Status: None (Preliminary result)   Collection Time: 05/07/17  4:43 PM  Result Value Ref Range Status   Specimen Description BLOOD LEFT HAND  Final   Special Requests   Final    BOTTLES DRAWN AEROBIC ONLY Blood Culture adequate volume   Culture   Final    NO GROWTH < 24 HOURS Performed at [facility], [place] [address] [place], [place] [address]    Report Status PENDING  Incomplete      Studies: Dg Chest Port 1 View  Result Date: 05/08/2017 CLINICAL DATA:  Pulmonary edema EXAM: PORTABLE CHEST 1 VIEW COMPARISON:  05/06/2017 FINDINGS: Cardiac shadow remains enlarged. Postsurgical changes are again seen. Vascular congestion is again noted. The patchy infiltrative changes are again seen and stable.  Small right pleural effusion remains. No acute bony abnormality is seen. IMPRESSION: Vascular congestion and patchy infiltrative changes stable from the previous exam. Electronically Signed   By: Inez Catalina M.D.   On: 05/08/2017 08:29    Scheduled Meds: . acetylcysteine  3 mL Nebulization TID  . allopurinol  100 mg Oral Daily  . amiodarone  400 mg Oral BID  . amLODipine  10 mg Oral Daily  . aspirin EC  81 mg Oral QHS  . brimonidine  1 drop Both Eyes QODAY  . chlorhexidine  15 mL Mouth Rinse BID  . clopidogrel  75 mg Oral Daily  . doxazosin  4 mg Oral Daily  . doxycycline  100 mg Oral Q12H  . enoxaparin (LOVENOX) injection  30 mg Subcutaneous Q24H  . ezetimibe  10 mg Oral Daily  . fenofibrate  160 mg Oral Daily  . FLUoxetine  20 mg Oral Daily  . furosemide  40 mg Intravenous BID  . hydrALAZINE  75 mg Oral BID  . isosorbide mononitrate  120 mg Oral Daily  . mouth rinse  15 mL Mouth Rinse q12n4p  . Melatonin  3 mg Oral QHS  . metoprolol tartrate  50 mg Oral BID  .  morphine injection  2 mg Intravenous Once  . polyethylene glycol  17 g Oral BID  . ranolazine  1,000 mg Oral BID  . senna-docusate  1 tablet Oral BID  .  sodium chloride flush  3 mL Intravenous Q12H    Continuous Infusions: . sodium chloride    . sodium chloride Stopped (05/08/17 1600)  . ceFEPime (MAXIPIME) IV 1 g (05/08/17 1932)  . nitroGLYCERIN Stopped (05/08/17 1300)     LOS: 7 days     Alma Friendly, MD [facility]   If 7PM-7AM, please contact night-coverage [email or web address] Password [facility] 05/08/2017, 9:10 PM

## 2017-05-08 NOTE — Progress Notes (Signed)
Palliative Medicine consult noted. Due to high referral volume, there may be a delay seeing this patient. Please call the Palliative Medicine Team office at [phone] if recommendations are needed in the interim.  Thank you for inviting [place] to see this patient.  Marjie Skiff Aerilyn Slee, RN, BSN, [facility] Palliative Medicine Team 05/08/2017 12:27 PM Office [phone]

## 2017-05-09 ENCOUNTER — Encounter (HOSPITAL_COMMUNITY): Payer: Self-pay

## 2017-05-09 ENCOUNTER — Inpatient Hospital Stay (HOSPITAL_COMMUNITY): Payer: Medicare Other

## 2017-05-09 DIAGNOSIS — N184 Chronic kidney disease, stage 4 (severe): Secondary | ICD-10-CM

## 2017-05-09 DIAGNOSIS — N179 Acute kidney failure, unspecified: Secondary | ICD-10-CM

## 2017-05-09 DIAGNOSIS — I5033 Acute on chronic diastolic (congestive) heart failure: Secondary | ICD-10-CM

## 2017-05-09 DIAGNOSIS — Z7189 Other specified counseling: Secondary | ICD-10-CM

## 2017-05-09 DIAGNOSIS — Z515 Encounter for palliative care: Secondary | ICD-10-CM

## 2017-05-09 DIAGNOSIS — I480 Paroxysmal atrial fibrillation: Secondary | ICD-10-CM

## 2017-05-09 DIAGNOSIS — R079 Chest pain, unspecified: Secondary | ICD-10-CM

## 2017-05-09 DIAGNOSIS — F419 Anxiety disorder, unspecified: Secondary | ICD-10-CM

## 2017-05-09 LAB — BASIC METABOLIC PANEL
Anion gap: 14 (ref 5–15)
BUN: 80 mg/dL — AB (ref 6–20)
CHLORIDE: 94 mmol/L — AB (ref 101–111)
CO2: 24 mmol/L (ref 22–32)
CREATININE: 3.81 mg/dL — AB (ref 0.61–1.24)
Calcium: 9.4 mg/dL (ref 8.9–10.3)
GFR calc Af Amer: 17 mL/min — ABNORMAL LOW (ref >60)
GFR calc non Af Amer: 14 mL/min — ABNORMAL LOW (ref >60)
GLUCOSE: 135 mg/dL — AB (ref 65–99)
POTASSIUM: 4.4 mmol/L (ref 3.5–5.1)
SODIUM: 132 mmol/L — AB (ref 135–145)

## 2017-05-09 LAB — CBC WITH DIFFERENTIAL/PLATELET
BASOS ABS: 0 10*3/uL (ref 0.0–0.1)
Basophils Relative: 0 %
EOS PCT: 7 %
Eosinophils Absolute: 0.6 10*3/uL (ref 0.0–0.7)
HCT: 31.6 % — ABNORMAL LOW (ref 39.0–52.0)
Hemoglobin: 10.7 g/dL — ABNORMAL LOW (ref 13.0–17.0)
LYMPHS ABS: 0.9 10*3/uL (ref 0.7–4.0)
LYMPHS PCT: 10 %
MCH: 32.7 pg (ref 26.0–34.0)
MCHC: 33.9 g/dL (ref 30.0–36.0)
MCV: 96.6 fL (ref 78.0–100.0)
MONO ABS: 0.9 10*3/uL (ref 0.1–1.0)
MONOS PCT: 11 %
Neutro Abs: 6 10*3/uL (ref 1.7–7.7)
Neutrophils Relative %: 72 %
PLATELETS: 304 10*3/uL (ref 150–400)
RBC: 3.27 MIL/uL — ABNORMAL LOW (ref 4.22–5.81)
RDW: 14.3 % (ref 11.5–15.5)
WBC: 8.4 10*3/uL (ref 4.0–10.5)

## 2017-05-09 LAB — CULTURE, BLOOD (ROUTINE X 2)
CULTURE: NO GROWTH
CULTURE: NO GROWTH
SPECIAL REQUESTS: ADEQUATE
SPECIAL REQUESTS: ADEQUATE

## 2017-05-09 LAB — PROCALCITONIN: Procalcitonin: 1.19 ng/mL

## 2017-05-09 MED ORDER — FUROSEMIDE 10 MG/ML IJ SOLN
120.0000 mg | Freq: Three times a day (TID) | INTRAVENOUS | Status: DC
  Administered 2017-05-09 – 2017-05-17 (×23): 120 mg via INTRAVENOUS
  Filled 2017-05-09: qty 12
  Filled 2017-05-09: qty 4
  Filled 2017-05-09: qty 10
  Filled 2017-05-09: qty 12
  Filled 2017-05-09 (×3): qty 10
  Filled 2017-05-09: qty 2
  Filled 2017-05-09 (×8): qty 10
  Filled 2017-05-09: qty 2
  Filled 2017-05-09 (×4): qty 10
  Filled 2017-05-09 (×3): qty 12
  Filled 2017-05-09 (×2): qty 10

## 2017-05-09 NOTE — Progress Notes (Signed)
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 Patient reports symptoms did not feel like acid reflux. Reports pain radiating into his jaw similar to previous heart attacks. Patient reports taking approximately 4 nitros with just temporary relief of symptoms before return of pain symptoms. In the ER, pt was noted to be flu positive, flat troponin, admitted for further management.  Today, pt noted with some improvement, no further CP episodes, dyspnea improving, no fever/chills noted.  Worsening renal function, nephrology consulted.    Assessment/Plan: Principal Problem:   Anxiety Active Problems:   Unstable angina (HCC)   Anemia due to chronic kidney disease   Essential hypertension   OSA on CPAP   NSTEMI (non-ST elevated myocardial infarction) ([place])   Demand ischemia (HCC)   Influenza A   Acute respiratory failure with hypoxia (HCC)   Pulmonary edema   Paroxysmal atrial fibrillation (HCC)   Acute on chronic diastolic heart failure (HCC)   Acute renal failure superimposed on stage 4 chronic kidney disease (HCC)   Chest pain   Goals of care, counseling/discussion   Palliative care by specialist  Sepsis 2/2 Influenza A with possible PNA Last temp 102.2 on 3/20, no luekocytosis BC X 2 NGTD, repeat NGTD Procalcitonin trending downwards Sputum cx with few Gram + cocci in clusters, few gram - rods. MRSA PCR negative CXR: with stable pulm edema Vs PNA Completed tamiflu Continue cefepime, doxycycline due to ?influenza PNA, although MRSA PCR neg    Unstable angina/NSTEMI No further chest pain Extensive cardiac hx as mentioned above Trop peaked at 4.49, now down trending, EKG with t wave changes  ECHO with EF of 50-55%, Grade 2 DD, no RWMA Cardiology on board: added ranexa 500 mg BID. Still a poor candidate for cardiac cath due to extensive prior occlusions, with a low chance of successful revascularization, marginal resp status, worsening CKD Continue ASA, plavix, imdur, metoprolol, NTG prn Monitor closely  Acute on chronic diastolic HF Repeat CXR with stable pulm edema ECHO as above Discontinue IV Lasix due to worsening renal function Cardiology on board  AKI on CKD stage IV Creatinine of 3.4 on admission-->currently rising Follows with nephrology in [place], not on HD Nephrology consulted  P.Afib Currently HR controlled Stopped IV amiodarone, switched to PO amiodarone Cardiology on board, contemplating [facility], ?possible NOAC on discharge  Essential hypertension Stable Continue metoprolol (50 BID), hydralazine (increased to 75 BID), amlodipine (increased to 10), imdur, lasix  Anemia of chronic disease Stable, monitor closely  BPH Continue Cardura  Dyslipidemia Continue fenofibrate, started zetia  OSA on CPAP CPAP at night  Anxiety Continue xanax prn Psych consulted, no further recs  GOC Palliative consulted, continue full scope of treatment/management except heart surgery     Code Status: Full  Family Communication: Daughter at bedside  Disposition Plan: Once work-up complete, and patient improving steadily   Consultants:  Cardiology  Nephrology  Procedures:  None  Antimicrobials:  Cefepime  Doxcycline   Tamiflu   DVT prophylaxis:  Lovenox   Objective: Vitals:   05/09/17 1144 05/09/17 1200 05/09/17 1300 05/09/17 1400  BP:  116/62 123/65 130/72  Pulse:  (!) 52 (!) 46 (!) 54  Resp:  19 (!) 21 (!) 22  Temp: 97.8 F (36.6 C)     TempSrc: Oral     SpO2:  99% 100% 97%   Weight:      Height:        Intake/Output Summary (Last 24 hours) at 05/09/2017 1444 Last data filed at 05/09/2017 1129 Gross per 24 hour  Intake 1277 ml  Output 1650 ml  Net -373 ml   Filed Weights   05/07/17 0324 05/08/17 0500 05/09/17 0500  Weight: 123.1 kg (271 lb 6.2 oz) 122.6 kg (270 lb 4.5 oz) 123.5 kg (272 lb 4.3 oz)    Exam:   General:  NAD   Cardiovascular: S1, S2 present  Respiratory: Mild crackles noted b/l at the bases  Abdomen: Soft, NT, ND, BS present  Musculoskeletal: No pedal edema  Skin: Normal  Psychiatry: Normal mood   Data Reviewed: CBC: Recent Labs  Lab 05/05/17 0245 05/06/17 0304 05/07/17 0304 05/08/17 0259 05/09/17 0352  WBC 6.2 6.5 8.5 6.1 8.4  NEUTROABS  --   --   --  3.8 6.0  HGB 10.0* 9.4* 9.8* 9.8* 10.7*  HCT 29.9* 27.8* 29.3* 29.2* 31.6*  MCV 98.4 96.5 96.1 96.1 96.6  PLT 136* 152 212 228 536   Basic Metabolic Panel: Recent Labs  Lab 05/03/17 0255 05/04/17 0303 05/05/17 0245 05/06/17 0304 05/07/17 0304 05/08/17 0259 05/09/17 0352  NA 133* 135 133* 133* 134* 135 132*  K 4.1 4.8 4.3 4.2 4.2 4.5 4.4  CL 104 107 103 101 100* 99* 94*  CO2 18* 16* 19* 22 22 21* 24  GLUCOSE 130* 105* 118* 112* 196* 127* 135*  BUN 57* 54* 55* 55* 63* 72* 80*  CREATININE 2.94* 2.72* 2.77* 2.85* 2.94* 3.22* 3.81*  CALCIUM 8.4* 8.8* 8.6* 8.6* 8.9 9.2 9.4  MG 2.0 2.1 2.4  --   --   --   --    GFR: Estimated Creatinine Clearance: 21.8 mL/min (A) (by C-G formula based on SCr of 3.81 mg/dL (H)). Liver Function Tests: Recent Labs  Lab 05/04/17 0303 05/05/17 0245  AST 42* 36  ALT 29 33  ALKPHOS 24* 21*  BILITOT 0.9 0.9  PROT 6.5 6.0*  ALBUMIN 3.1* 2.7*   No results for input(s): LIPASE, AMYLASE in the last 168 hours. No results for input(s): AMMONIA in the last 168 hours. Coagulation Profile: No results for input(s): INR, PROTIME in the last 168 hours. Cardiac Enzymes: Recent Labs  Lab 05/07/17 1542 05/07/17 2202 05/08/17 0833  05/08/17 1520 05/08/17 1833  TROPONINI 4.22* 4.49* 3.32* 2.62* 2.50*   BNP (last 3 results) No results for input(s): PROBNP in the last 8760 hours. HbA1C: No results for input(s): HGBA1C in the last 72 hours. CBG: No results for input(s): GLUCAP in the last 168 hours. Lipid Profile: No results for input(s): CHOL, HDL, LDLCALC, TRIG, CHOLHDL, LDLDIRECT in the last 72 hours. Thyroid Function Tests: No results for input(s): TSH, T4TOTAL, FREET4, T3FREE, THYROIDAB in the last 72 hours. Anemia Panel: No results for input(s): VITAMINB12, FOLATE, FERRITIN, TIBC, IRON, RETICCTPCT in the last 72 hours. Urine analysis: No results found for: COLORURINE, APPEARANCEUR, LABSPEC, PHURINE, GLUCOSEU, HGBUR, BILIRUBINUR, KETONESUR, PROTEINUR, UROBILINOGEN, NITRITE, LEUKOCYTESUR Sepsis Labs: @LABRCNTIP (procalcitonin:4,lacticidven:4)  ) Recent Results (from the past 240 hour(s))  Culture, blood (routine x 2)     Status: None   Collection Time: 05/02/17  1:06 AM  Result Value Ref Range Status   Specimen Description BLOOD RIGHT HAND  Final   Special Requests  Final    BOTTLES DRAWN AEROBIC AND ANAEROBIC Blood Culture results may not be optimal due to an inadequate volume of blood received in culture bottles   Culture   Final    NO GROWTH 5 DAYS Performed at [facility], [place] [address], [place], [place] [address]    Report Status 05/07/2017 FINAL  Final  Culture, blood (routine x 2)     Status: None   Collection Time: 05/02/17  1:07 AM  Result Value Ref Range Status   Specimen Description BLOOD RIGHT HAND  Final   Special Requests   Final    BOTTLES DRAWN AEROBIC AND ANAEROBIC Blood Culture results may not be optimal due to an inadequate volume of blood received in culture bottles   Culture   Final    NO GROWTH 5 DAYS Performed at [facility], [place] [address] [place], [place] [address]    Report Status 05/07/2017 FINAL  Final  MRSA PCR Screening     Status: None   Collection  Time: 05/02/17  9:45 AM  Result Value Ref Range Status   MRSA by PCR NEGATIVE NEGATIVE Final    Comment:        The GeneXpert MRSA Assay (FDA approved for NASAL specimens only), is one component of a comprehensive MRSA colonization surveillance program. It is not intended to diagnose MRSA infection nor to guide or monitor treatment for MRSA infections. Performed at [facility], [place] [address] [place], [place] [address]   MRSA PCR Screening     Status: None   Collection Time: 05/04/17  5:15 PM  Result Value Ref Range Status   MRSA by PCR NEGATIVE NEGATIVE Final    Comment:        The GeneXpert MRSA Assay (FDA approved for NASAL specimens only), is one component of a comprehensive MRSA colonization surveillance program. It is not intended to diagnose MRSA infection nor to guide or monitor treatment for MRSA infections. Performed at [facility], [place] [address], [place], [place] [address]   Culture, expectorated sputum-assessment     Status: None   Collection Time: 05/04/17  6:06 PM  Result Value Ref Range Status   Specimen Description EXPECTORATED SPUTUM  Final   Special Requests NONE  Final   Sputum evaluation   Final    THIS SPECIMEN IS ACCEPTABLE FOR SPUTUM CULTURE Performed at [facility], [place] [address] [place], [place] [address]    Report Status 05/04/2017 FINAL  Final  Culture, respiratory (NON-Expectorated)     Status: None   Collection Time: 05/04/17  6:06 PM  Result Value Ref Range Status   Specimen Description EXPECTORATED SPUTUM  Final   Special Requests NONE Reflexed from [id number]  Final   Gram Stain   Final    FEW WBC PRESENT, PREDOMINANTLY PMN FEW SQUAMOUS EPITHELIAL CELLS PRESENT FEW GRAM POSITIVE COCCI IN CLUSTERS FEW GRAM NEGATIVE RODS RARE GRAM POSITIVE RODS    Culture   Final    Consistent with normal respiratory flora. Performed at [facility], [place] [address], [place], [place] [address]    Report Status 05/07/2017  FINAL  Final  Culture, blood (routine x 2) Call MD if unable to obtain prior to antibiotics being given     Status: None   Collection Time: 05/04/17  8:30 PM  Result Value Ref Range Status   Specimen Description BLOOD RIGHT HAND  Final   Special Requests Blood Culture adequate volume  BLOOD AEROBIC BOTTLE  Final   Culture   Final    NO GROWTH 5 DAYS Performed at [facility], [place] [address], [place], [place] [address]    Report Status 05/09/2017 FINAL  Final  Culture, blood (routine x 2) Call MD if unable to obtain prior to antibiotics being given     Status: None   Collection Time: 05/04/17  8:35 PM  Result Value Ref Range Status   Specimen Description BLOOD RIGHT HAND  Final   Special Requests BLOOD AEROBIC BOTTLE Blood Culture adequate volume  Final   Culture   Final    NO GROWTH 5 DAYS Performed at [facility], [place] [address], [place], [place] [address]    Report Status 05/09/2017 FINAL  Final  Culture, blood (routine x 2)     Status: None (Preliminary result)   Collection Time: 05/07/17  4:38 PM  Result Value Ref Range Status   Specimen Description BLOOD RIGHT HAND  Final   Special Requests   Final    BOTTLES DRAWN AEROBIC ONLY Blood Culture adequate volume   Culture   Final    NO GROWTH 2 DAYS Performed at [facility], [place] [address] [place], [place] [address]    Report Status PENDING  Incomplete  Culture, blood (routine x 2)     Status: None (Preliminary result)   Collection Time: 05/07/17  4:43 PM  Result Value Ref Range Status   Specimen Description BLOOD LEFT HAND  Final   Special Requests   Final    BOTTLES DRAWN AEROBIC ONLY Blood Culture adequate volume   Culture   Final    NO GROWTH 2 DAYS Performed at [facility], [place] [address] [place], [place] [address]    Report Status PENDING  Incomplete      Studies: Dg Chest Port 1 View  Result Date: 05/09/2017 CLINICAL DATA:  Shortness of breath. EXAM: PORTABLE CHEST 1 VIEW COMPARISON:   Chest x-ray from yesterday. FINDINGS: Stable cardiomegaly and vascular congestion. Patchy airspace opacities in the right greater than left lungs are similar to prior study. Unchanged small right pleural effusion. No pneumothorax. No acute osseous abnormality. IMPRESSION: Stable vascular congestion and patchy right greater than left airspace opacities which could reflect edema or infection. Electronically Signed   By: Titus Dubin M.D.   On: 05/09/2017 07:54    Scheduled Meds: . acetylcysteine  3 mL Nebulization TID  . allopurinol  100 mg Oral Daily  . amiodarone  400 mg Oral BID  . amLODipine  10 mg Oral Daily  . aspirin EC  81 mg Oral QHS  . brimonidine  1 drop Both Eyes QODAY  . chlorhexidine  15 mL Mouth Rinse BID  . clopidogrel  75 mg Oral Daily  . doxazosin  4 mg Oral Daily  . doxycycline  100 mg Oral Q12H  . enoxaparin (LOVENOX) injection  30 mg Subcutaneous Q24H  . ezetimibe  10 mg Oral Daily  . fenofibrate  160 mg Oral Daily  . FLUoxetine  20 mg Oral Daily  . hydrALAZINE  75 mg Oral BID  . isosorbide mononitrate  120 mg Oral Daily  . mouth rinse  15 mL Mouth Rinse q12n4p  . Melatonin  3 mg Oral QHS  . metoprolol tartrate  50 mg Oral BID  .  morphine injection  2 mg Intravenous Once  . polyethylene glycol  17 g Oral BID  . ranolazine  1,000 mg Oral  BID  . senna-docusate  1 tablet Oral BID  . sodium chloride flush  3 mL Intravenous Q12H    Continuous Infusions: . sodium chloride    . sodium chloride Stopped (05/08/17 1600)  . ceFEPime (MAXIPIME) IV Stopped (05/08/17 2002)  . nitroGLYCERIN Stopped (05/08/17 1300)     LOS: 8 days     Alma Friendly, MD [facility]   If 7PM-7AM, please contact night-coverage [email or web address] Password [facility] 05/09/2017, 2:44 PM

## 2017-05-09 NOTE — Consult Note (Signed)
Consultation Note Date: 05/09/2017   Patient Name: Nathan Wolfe  DOB: May 21, 1945  MRN: [id number]  Age / Sex: 72 y.o., male  PCP: Patient, No Pcp Per Referring Physician: Alma Friendly, MD  Reason for Consultation: Establishing goals of care and Psychosocial/spiritual support  HPI/Patient Profile: 72 y.o. male  with past medical history of coronary artery disease, CABG x2, multiple cardiac stents, left endarterectomy, chronic kidney disease stage IV, hypertension, hyperlipidemia, obstructive sleep apnea (on CPAP), diastolic heart failure grade 2 admitted on 04/30/2017 with chest pain.  He was also found to be flu positive.  His creatinine upon admission was 3.4.  Chest x-ray showed stable pulmonary edema versus pneumonia.  Patient's troponin peaked at 4.49 and is now 2.5.  Patient has been seen by psychiatry for treatment of depression and is currently on Prozac Xanax as well as melatonin.  Patient and his wife reside in [place] and had come to [place] to visit their daughter Nathan Wolfe.  Once arriving in [place], both parties contracted the flu and were hospitalized.  Nathan Wolfe, has had a non-STEMI  Consult ordered for goals of care.   Clinical Assessment and Goals of Care: Met with patient, chart reviewed.  His daughter Nathan Wolfe is at the bedside.  Patient shares that his wife is improved and she is now out of the hospital.  He describes a long cardiac history and "multiple heart attacks".  Apparently he has been dealing with heart disease for the past 25 years.  Introduced the topics of [place] and confirmed that he is a full code at this point.  Daughter verbalized concerns about her parents ability to care for themselves and their current residence in [place] secondary to the number of stairs.  Patient's ultimate goal is to get strong enough to fly home to [facility].  He is  agreeable to home health in order to achieve this  Patient at this point can participate in his own decision making.  His healthcare proxy in the event that he were unable to do this would be his wife, Nathan Wolfe with the assistance of their 2 daughters, Nathan Wolfe and Nathan Wolfe    SUMMARY OF RECOMMENDATIONS   Continue with full code, full scope of treatment Discussed CODE STATUS, and defined full code versus DNR for patient Patient has an established relationship with a nephrologist in [place] as well as a cardiologist.  They have plans in place for follow-up appointments as soon as they can return to [place] choices for loving People booklet given to patient and family to review Patient to discharge back to his daughter's home with home health with ultimate goal to fly home to [place] soon as he can Patient states that he would not undergo cardiac surgical interventions such as a cath or open heart surgery. He states he would undergo hemodialysis were his kidney function to worsen Order in place to care management to facilitate home health services upon discharge Code Status/Advance Care Planning:  Full code    Symptom Management:  Anxiety: Continue with Xanax 0.5 mg 3 times a day  Depression: Continue with Prozac 20 mg daily  Insomnia: Continue with melatonin 3 mg at bedtime  Palliative Prophylaxis:   Aspiration, Bowel Regimen, Delirium Protocol, Eye Care, Frequent Pain Assessment, Oral Care and Turn Reposition  Additional Recommendations (Limitations, Scope, Preferences):  Full Scope Treatment  Psycho-social/Spiritual:   Desire for further Chaplaincy support:no  Additional Recommendations: Referral to Community Resources   Prognosis:   Unable to determine  Discharge Planning: Home with Home Health      Primary Diagnoses: Present on Admission: . Unstable angina ([place]) . Anemia due to chronic kidney disease . Essential hypertension   I have reviewed the  medical record, interviewed the patient and family, and examined the patient. The following aspects are pertinent.  Past Medical History:  Diagnosis Date  . CAD (coronary artery disease)   . Carotid artery stenosis   . Chronic diastolic heart failure ([place])    noted on echo 05/03/2017 (EF 50-55%)  . Chronic kidney disease (CKD), stage IV (severe) ([place])   . Morbid obesity ([place])   . OSA on CPAP    Social History   Socioeconomic History  . Marital status: Married    Spouse name: Not on file  . Number of children: Not on file  . Years of education: Not on file  . Highest education level: Not on file  Occupational History  . Not on file  Social Needs  . Financial resource strain: Not on file  . Food insecurity:    Worry: Not on file    Inability: Not on file  . Transportation needs:    Medical: Not on file    Non-medical: Not on file  Tobacco Use  . Smoking status: Unknown If Ever Smoked  . Smokeless tobacco: Never Used  Substance and Sexual Activity  . Alcohol use: Not on file  . Drug use: Not on file  . Sexual activity: Not on file  Lifestyle  . Physical activity:    Days per week: Not on file    Minutes per session: Not on file  . Stress: Not on file  Relationships  . Social connections:    Talks on phone: Not on file    Gets together: Not on file    Attends religious service: Not on file    Active member of club or organization: Not on file    Attends meetings of clubs or organizations: Not on file    Relationship status: Not on file  Other Topics Concern  . Not on file  Social History Narrative  . Not on file   History reviewed. No pertinent family history. Scheduled Meds: . acetylcysteine  3 mL Nebulization TID  . allopurinol  100 mg Oral Daily  . amiodarone  400 mg Oral BID  . amLODipine  10 mg Oral Daily  . aspirin EC  81 mg Oral QHS  . brimonidine  1 drop Both Eyes QODAY  . chlorhexidine  15 mL Mouth Rinse BID  . clopidogrel  75 mg Oral Daily  .  doxazosin  4 mg Oral Daily  . doxycycline  100 mg Oral Q12H  . enoxaparin (LOVENOX) injection  30 mg Subcutaneous Q24H  . ezetimibe  10 mg Oral Daily  . fenofibrate  160 mg Oral Daily  . FLUoxetine  20 mg Oral Daily  . hydrALAZINE  75 mg Oral BID  . isosorbide mononitrate  120 mg Oral Daily  . mouth rinse  15 mL  Mouth Rinse q12n4p  . Melatonin  3 mg Oral QHS  . metoprolol tartrate  50 mg Oral BID  .  morphine injection  2 mg Intravenous Once  . polyethylene glycol  17 g Oral BID  . ranolazine  1,000 mg Oral BID  . senna-docusate  1 tablet Oral BID  . sodium chloride flush  3 mL Intravenous Q12H   Continuous Infusions: . sodium chloride    . sodium chloride Stopped (05/08/17 1600)  . ceFEPime (MAXIPIME) IV Stopped (05/08/17 2002)  . nitroGLYCERIN Stopped (05/08/17 1300)   PRN Meds:.sodium chloride, acetaminophen, ALPRAZolam, benzonatate, diphenhydrAMINE, gi cocktail, hydrALAZINE, ipratropium-albuterol, morphine injection, nitroGLYCERIN, ondansetron (ZOFRAN) IV, sodium chloride flush Medications Prior to Admission:  Prior to Admission medications   Medication Sig Start Date End Date Taking? Authorizing Provider  allopurinol (ZYLOPRIM) 100 MG tablet Take 100 mg by mouth daily.   Yes [provider]  amLODipine (NORVASC) 5 MG tablet Take 5 mg by mouth daily.   Yes [provider]  aspirin EC 81 MG tablet Take 81 mg by mouth at bedtime.   Yes [provider]  azithromycin (ZITHROMAX) 250 MG tablet Take 250-500 mg by mouth daily. Take 500 mg for the first day then 250 mg for the next four days   Yes [provider]  brimonidine (ALPHAGAN) 0.2 % ophthalmic solution Place 1 drop into both eyes every other day.   Yes [provider]  clopidogrel (PLAVIX) 75 MG tablet Take 75 mg by mouth daily.   Yes [provider]  diphenhydrAMINE (BENADRYL) 25 MG tablet Take 25 mg by mouth at bedtime.   Yes [provider]  doxazosin (CARDURA)  4 MG tablet Take 4 mg by mouth daily.   Yes [provider]  fenofibrate (TRICOR) 145 MG tablet Take 145 mg by mouth daily.   Yes [provider]  FLUoxetine (PROZAC) 20 MG tablet Take 20 mg by mouth daily.   Yes [provider]  furosemide (LASIX) 20 MG tablet Take 40 mg by mouth daily.   Yes [provider]  hydrALAZINE (APRESOLINE) 50 MG tablet Take 50 mg by mouth 2 (two) times daily.   Yes [provider]  isosorbide mononitrate (IMDUR) 60 MG 24 hr tablet Take 120 mg by mouth daily.   Yes [provider]  Melatonin 5 MG TABS Take 5 mg by mouth at bedtime.   Yes [provider]  metoprolol tartrate (LOPRESSOR) 50 MG tablet Take 50 mg by mouth 2 (two) times daily.   Yes [provider]  nitroGLYCERIN (NITROSTAT) 0.4 MG SL tablet Place 0.4 mg under the tongue every 5 (five) minutes as needed for chest pain.   Yes [provider]  omega-3 acid ethyl esters (LOVAZA) 1 g capsule Take 1 g by mouth daily.   Yes [provider]  Secukinumab (COSENTYX) 150 MG/ML SOSY Inject 300 mg into the skin every 30 (thirty) days.   Yes [provider]   Allergies  Allergen Reactions  . Statins Other (See Comments)    Full body muscle stuffiness/pain   Review of Systems  Unable to perform ROS: Other    Physical Exam  Constitutional: He is oriented to person, place, and time. He appears well-developed and well-nourished.  HENT:  Head: Normocephalic and atraumatic.  Neck: Normal range of motion.  Cardiovascular: Normal rate.  Pulmonary/Chest:  Short of breath with exertion  Abdominal: Soft.  Musculoskeletal: Normal range of motion.  Neurological: He is alert and  oriented to person, place, and time.  Skin: Skin is warm and dry.  Psychiatric: He has a normal mood and affect. His behavior is normal. Judgment and thought content normal.  Nursing note and vitals reviewed.   Vital Signs: BP 120/62   Pulse  (!) 56   Temp 97.8 F (36.6 C) (Oral)   Resp (!) 26   Ht 5' 7"  (1.702 m)   Wt 123.5 kg (272 lb 4.3 oz)   SpO2 96%   BMI 42.64 kg/m  Pain Scale: 0-10   Pain Score: 0-No pain   SpO2: SpO2: 96 % O2 Device:SpO2: 96 % O2 Flow Rate: .O2 Flow Rate (L/min): 5 L/min  IO: Intake/output summary:   Intake/Output Summary (Last 24 hours) at 05/09/2017 1301 Last data filed at 05/09/2017 1129 Gross per 24 hour  Intake 1277 ml  Output 1650 ml  Net -373 ml    LBM: Last BM Date: 05/06/17 Baseline Weight: Weight: 118.8 kg (262 lb) Most recent weight: Weight: 123.5 kg (272 lb 4.3 oz)     Palliative Assessment/Data:   Flowsheet Rows     Most Recent Value  Intake Tab  Referral Department  Hospitalist  Unit at Time of Referral  ICU  Palliative Care Primary Diagnosis  Cardiac  Date Notified  05/08/17  Palliative Care Type  New Palliative care  Reason for referral  Clarify Goals of Care  Date of Admission  04/30/17  Date first seen by Palliative Care  05/09/17  # of days Palliative referral response time  1 Day(s)  # of days IP prior to Palliative referral  8  Clinical Assessment  Palliative Performance Scale Score  50%  Pain Max last 24 hours  0  Pain Min Last 24 hours  0  Dyspnea Max Last 24 Hours  4  Dyspnea Min Last 24 hours  2  Nausea Max Last 24 Hours  0  Nausea Min Last 24 Hours  0  Anxiety Max Last 24 Hours  4  Anxiety Min Last 24 Hours  2  Psychosocial & Spiritual Assessment  Palliative Care Outcomes  Patient/Family meeting held?  Yes  Who was at the meeting?  pt and dtr      Time In: 1145 Time Out: 1300 Time Total: 75 min Greater than 50%  of this time was spent counseling and coordinating care related to the above assessment and plan.  Signed by: Dory Horn, NP   Please contact Palliative Medicine Team phone at [phone] for questions and concerns.  For individual provider: See Shea Evans

## 2017-05-09 NOTE — Progress Notes (Signed)
Progress Note  Patient Name: Nathan Wolfe Date of Encounter: 05/09/2017  Primary Cardiologist: Quay Burow, MD   Subjective   Making slow improvement.  Was able to walk a few steps in the room today.  Remains short of breath on oxygen.  Denies orthopnea.  No angina and over 48 hours. Good urine output, but creatinine has increased substantially, now up to 3.8. Troponin trending downwards.  Daughter states that his shortness of breath with light activity (such as getting in and out of a car or putting on clothes) was present even before the episode of influenza and heart failure exacerbation.  Inpatient Medications    Scheduled Meds: . acetylcysteine  3 mL Nebulization TID  . allopurinol  100 mg Oral Daily  . amiodarone  400 mg Oral BID  . amLODipine  10 mg Oral Daily  . aspirin EC  81 mg Oral QHS  . brimonidine  1 drop Both Eyes QODAY  . chlorhexidine  15 mL Mouth Rinse BID  . clopidogrel  75 mg Oral Daily  . doxazosin  4 mg Oral Daily  . doxycycline  100 mg Oral Q12H  . enoxaparin (LOVENOX) injection  30 mg Subcutaneous Q24H  . ezetimibe  10 mg Oral Daily  . fenofibrate  160 mg Oral Daily  . FLUoxetine  20 mg Oral Daily  . furosemide  40 mg Intravenous BID  . hydrALAZINE  75 mg Oral BID  . isosorbide mononitrate  120 mg Oral Daily  . mouth rinse  15 mL Mouth Rinse q12n4p  . Melatonin  3 mg Oral QHS  . metoprolol tartrate  50 mg Oral BID  .  morphine injection  2 mg Intravenous Once  . polyethylene glycol  17 g Oral BID  . ranolazine  1,000 mg Oral BID  . senna-docusate  1 tablet Oral BID  . sodium chloride flush  3 mL Intravenous Q12H   Continuous Infusions: . sodium chloride    . sodium chloride Stopped (05/08/17 1600)  . ceFEPime (MAXIPIME) IV Stopped (05/08/17 2002)  . nitroGLYCERIN Stopped (05/08/17 1300)   PRN Meds: sodium chloride, acetaminophen, ALPRAZolam, benzonatate, diphenhydrAMINE, gi cocktail, hydrALAZINE, ipratropium-albuterol, morphine  injection, nitroGLYCERIN, ondansetron (ZOFRAN) IV, sodium chloride flush   Vital Signs    Vitals:   05/09/17 0800 05/09/17 0817 05/09/17 0927 05/09/17 1000  BP: 131/74  (!) 145/67 (!) 149/70  Pulse: (!) 58  (!) 59 61  Resp: 19  (!) 25 (!) 24  Temp:      TempSrc:      SpO2: 94% 97% 98% 100%  Weight:      Height:        Intake/Output Summary (Last 24 hours) at 05/09/2017 1139 Last data filed at 05/09/2017 1129 Gross per 24 hour  Intake 1404.5 ml  Output 2200 ml  Net -795.5 ml   Filed Weights   05/07/17 0324 05/08/17 0500 05/09/17 0500  Weight: 271 lb 6.2 oz (123.1 kg) 270 lb 4.5 oz (122.6 kg) 272 lb 4.3 oz (123.5 kg)    Telemetry    Sinus rhythm, frequent PVCs- Personally Reviewed  ECG    Sinus rhythm with diffuse ST segment depression and T wave inversion in multiple leads - Personally Reviewed  Physical Exam  Severely obese GEN: No acute distress.   Neck:  Hard to see jugular venous pulsations, probably 5-6 cm above sternal angle Cardiac: RRR, no murmurs, rubs, or gallops.  Respiratory: Clear to auscultation bilaterally. GI: Soft, nontender, non-distended  MS:  Symmetrical  1+ pedal edema; No deformity. Neuro:  Nonfocal  Psych: Normal affect   Labs    Chemistry Recent Labs  Lab 05/04/17 0303 05/05/17 0245  05/07/17 0304 05/08/17 0259 05/09/17 0352  NA 135 133*   < > 134* 135 132*  K 4.8 4.3   < > 4.2 4.5 4.4  CL 107 103   < > 100* 99* 94*  CO2 16* 19*   < > 22 21* 24  GLUCOSE 105* 118*   < > 196* 127* 135*  BUN 54* 55*   < > 63* 72* 80*  CREATININE 2.72* 2.77*   < > 2.94* 3.22* 3.81*  CALCIUM 8.8* 8.6*   < > 8.9 9.2 9.4  PROT 6.5 6.0*  --   --   --   --   ALBUMIN 3.1* 2.7*  --   --   --   --   AST 42* 36  --   --   --   --   ALT 29 33  --   --   --   --   ALKPHOS 24* 21*  --   --   --   --   BILITOT 0.9 0.9  --   --   --   --   GFRNONAA 22* 21*   < > 20* 18* 14*  GFRAA 25* 25*   < > 23* 20* 17*  ANIONGAP 12 11   < > 12 15 14    < > = values in  this interval not displayed.     Hematology Recent Labs  Lab 05/07/17 0304 05/08/17 0259 05/09/17 0352  WBC 8.5 6.1 8.4  RBC 3.05* 3.04* 3.27*  HGB 9.8* 9.8* 10.7*  HCT 29.3* 29.2* 31.6*  MCV 96.1 96.1 96.6  MCH 32.1 32.2 32.7  MCHC 33.4 33.6 33.9  RDW 14.0 14.0 14.3  PLT 212 228 304    Cardiac Enzymes Recent Labs  Lab 05/07/17 2202 05/08/17 0833 05/08/17 1520 05/08/17 1833  TROPONINI 4.49* 3.32* 2.62* 2.50*   No results for input(s): TROPIPOC in the last 168 hours.   BNPNo results for input(s): BNP, PROBNP in the last 168 hours.   DDimer No results for input(s): DDIMER in the last 168 hours.   Radiology    Dg Chest Port 1 View  Result Date: 05/09/2017 CLINICAL DATA:  Shortness of breath. EXAM: PORTABLE CHEST 1 VIEW COMPARISON:  Chest x-ray from yesterday. FINDINGS: Stable cardiomegaly and vascular congestion. Patchy airspace opacities in the right greater than left lungs are similar to prior study. Unchanged small right pleural effusion. No pneumothorax. No acute osseous abnormality. IMPRESSION: Stable vascular congestion and patchy right greater than left airspace opacities which could reflect edema or infection. Electronically Signed   By: Titus Dubin M.D.   On: 05/09/2017 07:54   Dg Chest Port 1 View  Result Date: 05/08/2017 CLINICAL DATA:  Pulmonary edema EXAM: PORTABLE CHEST 1 VIEW COMPARISON:  05/06/2017 FINDINGS: Cardiac shadow remains enlarged. Postsurgical changes are again seen. Vascular congestion is again noted. The patchy infiltrative changes are again seen and stable. Small right pleural effusion remains. No acute bony abnormality is seen. IMPRESSION: Vascular congestion and patchy infiltrative changes stable from the previous exam. Electronically Signed   By: Inez Catalina M.D.   On: 05/08/2017 08:29    Cardiac Studies   Echo3/17/19: Study Conclusions  - Left ventricle: The cavity size was normal. Wall thickness was increased in a pattern of  mild LVH. Systolic function was normal. The  estimated ejection fraction was in the range of 50% to 55%. Mild hypokinesis of the anteroseptal myocardium. Features are consistent with a pseudonormal left ventricular filling pattern, with concomitant abnormal relaxation and increased filling pressure (grade 2 diastolic dysfunction). Doppler parameters are consistent with high ventricular filling pressure. - Aortic valve: Transvalvular velocity was within the normal range. There was no stenosis. There was no regurgitation. - Mitral valve: Transvalvular velocity was within the normal range. There was no evidence for stenosis. There was trivial regurgitation. - Left atrium: The atrium was severely dilated. - Right ventricle: The cavity size was normal. Wall thickness was normal. Systolic function was normal. - Atrial septum: No defect or patent foramen ovale was identified by color flow Doppler. - Tricuspid valve: There was mild regurgitation. - Pulmonary arteries: Systolic pressure was moderately to severely increased. PA peak pressure: 59 mm Hg (S).  Cardiac cath in March 2016 as per chart review from outside facility. Last catheterization was March 2016."His EFwasnormal. He has a patent LIMA to the LAD with an occluded LAD beyond LIMA insertion. He has a patent vein graft to the right status post stenting of the PDA with a 2.5 mm Synergy drug-eluting stent to the vein graft and a diseased ramus branch was with not intervened on."   Patient Profile     72 y.o. male with extensive coronary artery disease and previous redo bypass surgery (2000, 2011) as well as subsequent drug-eluting stent in SVG to the PDA (2016), with known occlusion of LAD beyond LIMA bypass, severely diseased ramus intermedius branch not amenable to further revascularization, admitted with influenza pneumonia complicated by small non-STEMI and heart failure exacerbation, with acute worsening of  chronic kidney disease stage IV following diuretics  Assessment & Plan    1. CAD s/p NSTEMI: Benefit of catheterization is doubtful in view of known anatomy, risks of contrast nephrotoxicity are very high.  Plan medical management.  Currently angina free.  Very likely he had demand ischemia rather than a true acute ruptured plaque/thrombotic event. 2.  AFib: Brief A. Fib 2 nights ago, currently on loading dose of oral amiodarone; to decrease to maintenance dose on March 26.  Note severely dilated left atrium, which increases the risk of recurrence.  The episode was brief and he is not yet on anticoagulation.  Direct oral anticoagulants will be hard to dose with his variable renal function.  He is on clopidogrel and aspirin for CAD/ NSTEMI.  The risks of "triple therapy" with an additional anticoagulant may outweigh the benefits. 3.  Ac on CKD: Creatinine continues to rise fairly sharply, will stop diuretics, will reassess on a daily basis.  I risk of disease progression to end-stage renal disease. 4.  Acute respiratory insufficiency with hypoxia: Primarily related to influenza on background of chronic diastolic heart failure. 5. CHF: Obesity and comorbid conditions make it hard to assess his volume status based on physical exam or chest x-ray.  Weight has been essentially unchanged over the last week.  BNP was minimally elevated on arrival at 366, baseline unknown.  Echo on March 15 did show findings suggestive of volume overload, but he has preserved left ventricular systolic function with EF 50-55%.  Severely dilated left atrium consistent with chronic heart failure.  Moderate pulmonary hypertension may be related to diastolic heart failure, but also to obesity, possible obstructive sleep apnea, acute lung disease.  It is a challenge to maintain the appropriate "dry weight" balance between heart failure and renal failure.  6.  HTN:  Control is fair.  Avoid RAAS inhibitors. 7. Deconditioning: Discussed  with daughter.  She foresees his coming home with her for home physical therapy and rehabilitation before he returns to [place].  They would like to avoid inpatient rehab.  For questions or updates, please contact [place] Please consult [email or web address] for contact info under Cardiology/STEMI.      Signed, Sanda Klein, MD  05/09/2017, 11:39 AM

## 2017-05-09 NOTE — Consult Note (Signed)
Renal Service Consult Note [facility]  Taeden Geller 05/09/2017 Sol Blazing Requesting Physician:  Dr Horris Latino  Reason for Consult:  CKD 4/5 , CHF exacerbation HPI: The patient is a 72 y.o. year-old with hx of CAD hx CABG 2000 and redo 2011, sp L CEA, CKD stage IV,obesity , OSA on CPAP admitted on 05/01/17 with c/o L sided chest pain.  Was admitted to SDU for unstable angina.  Seen by cardiology, hx complex anatomy, CABG and redo CABG, they recommended medical Rx w IV NTG and heparin.  Lasix held and he received some IVF"s.  CXR was ok on admission. Pt developed SOB and repeat CXR 3/18 showed early CHF and pt was started on IV lasix 40 bid for the last 5 days.  Creat was 2.8 on admission and is now up to 3.8 today.  Asked to see for renal failure.   Pt sig SOB, legs swollen.  No CP.  CXR's show worsening, last CXR 3/23 today showed sig bilat CHF/ edema.  Wt's are up 7kg from [employer]  No nsaids/ acei/ ARB given, no contrast.    Pt sees a kidney doctor in [facility], he is up visting family here with his wife.  His wife is a dialysis patient.    HOme meds: -norvasc 5/ cardura 4mg / lasix 40 qd/ hydralazine 50 bid/ metoprolol 50 bid - indur 60/ ecasa 81/ plavix 75/ tricor/ lovaza/ sl ntg prn - prozac 20/ allopurinol 100 qd   ROS  denies CP  no joint pain   no HA  no blurry vision  no rash  no diarrhea  no nausea/ vomiting  no dysuria  no difficulty voiding  no change in urine color    Past Medical History  Past Medical History:  Diagnosis Date  . CAD (coronary artery disease)   . Carotid artery stenosis   . Chronic diastolic heart failure ([place])    noted on echo 05/03/2017 (EF 50-55%)  . Chronic kidney disease (CKD), stage IV (severe) ([place])   . Morbid obesity ([place])   . OSA on CPAP    Past Surgical History  Past Surgical History:  Procedure Laterality Date  . CAROTID ENDARTERECTOMY Left 09/2016  . CORONARY ARTERY BYPASS GRAFT     Family History History  reviewed. No pertinent family history. Social History  has an unknown smoking status. He has never used smokeless tobacco. His alcohol and drug histories are not on file. Allergies  Allergies  Allergen Reactions  . Statins Other (See Comments)    Full body muscle stuffiness/pain   Home medications Prior to Admission medications   Medication Sig Start Date End Date Taking? Authorizing Provider  allopurinol (ZYLOPRIM) 100 MG tablet Take 100 mg by mouth daily.   Yes [provider]  amLODipine (NORVASC) 5 MG tablet Take 5 mg by mouth daily.   Yes [provider]  aspirin EC 81 MG tablet Take 81 mg by mouth at bedtime.   Yes [provider]  azithromycin (ZITHROMAX) 250 MG tablet Take 250-500 mg by mouth daily. Take 500 mg for the first day then 250 mg for the next four days   Yes [provider]  brimonidine (ALPHAGAN) 0.2 % ophthalmic solution Place 1 drop into both eyes every other day.   Yes [provider]  clopidogrel (PLAVIX) 75 MG tablet Take 75 mg by mouth daily.   Yes [provider]  diphenhydrAMINE (BENADRYL) 25 MG tablet Take 25 mg by mouth at bedtime.  Yes [provider]  doxazosin (CARDURA) 4 MG tablet Take 4 mg by mouth daily.   Yes [provider]  fenofibrate (TRICOR) 145 MG tablet Take 145 mg by mouth daily.   Yes [provider]  FLUoxetine (PROZAC) 20 MG tablet Take 20 mg by mouth daily.   Yes [provider]  furosemide (LASIX) 20 MG tablet Take 40 mg by mouth daily.   Yes [provider]  hydrALAZINE (APRESOLINE) 50 MG tablet Take 50 mg by mouth 2 (two) times daily.   Yes [provider]  isosorbide mononitrate (IMDUR) 60 MG 24 hr tablet Take 120 mg by mouth daily.   Yes [provider]  Melatonin 5 MG TABS Take 5 mg by mouth at bedtime.   Yes [provider]  metoprolol tartrate (LOPRESSOR) 50 MG tablet Take 50 mg by mouth 2 (two) times daily.    Yes [provider]  nitroGLYCERIN (NITROSTAT) 0.4 MG SL tablet Place 0.4 mg under the tongue every 5 (five) minutes as needed for chest pain.   Yes [provider]  omega-3 acid ethyl esters (LOVAZA) 1 g capsule Take 1 g by mouth daily.   Yes [provider]  Secukinumab (COSENTYX) 150 MG/ML SOSY Inject 300 mg into the skin every 30 (thirty) days.   Yes [provider]   Liver Function Tests Recent Labs  Lab 05/04/17 0303 05/05/17 0245  AST 42* 36  ALT 29 33  ALKPHOS 24* 21*  BILITOT 0.9 0.9  PROT 6.5 6.0*  ALBUMIN 3.1* 2.7*   No results for input(s): LIPASE, AMYLASE in the last 168 hours. CBC Recent Labs  Lab 05/07/17 0304 05/08/17 0259 05/09/17 0352  WBC 8.5 6.1 8.4  NEUTROABS  --  3.8 6.0  HGB 9.8* 9.8* 10.7*  HCT 29.3* 29.2* 31.6*  MCV 96.1 96.1 96.6  PLT 212 228 502   Basic Metabolic Panel Recent Labs  Lab 05/03/17 0255 05/04/17 0303 05/05/17 0245 05/06/17 0304 05/07/17 0304 05/08/17 0259 05/09/17 0352  NA 133* 135 133* 133* 134* 135 132*  K 4.1 4.8 4.3 4.2 4.2 4.5 4.4  CL 104 107 103 101 100* 99* 94*  CO2 18* 16* 19* 22 22 21* 24  GLUCOSE 130* 105* 118* 112* 196* 127* 135*  BUN 57* 54* 55* 55* 63* 72* 80*  CREATININE 2.94* 2.72* 2.77* 2.85* 2.94* 3.22* 3.81*  CALCIUM 8.4* 8.8* 8.6* 8.6* 8.9 9.2 9.4   Iron/TIBC/Ferritin/ %Sat No results found for: IRON, TIBC, FERRITIN, IRONPCTSAT  Vitals:   05/09/17 1144 05/09/17 1200 05/09/17 1300 05/09/17 1400  BP:  116/62 123/65 130/72  Pulse:  (!) 52 (!) 46 (!) 54  Resp:  19 (!) 21 (!) 22  Temp: 97.8 F (36.6 C)     TempSrc: Oral     SpO2:  99% 100% 97%  Weight:      Height:       Exam Gen alert, nasal O2 No rash, cyanosis or gangrene Sclera anicteric, throat clear  +JVD to angle of jaw Chest bilat insp rales 1/3 up post L and R RRR no MRG Abd soft ntnd no mass or ascites +bs obese GU normal male MS no joint effusions or deformity Ext 2+ bilat pitting LE edema, no  wounds or ulcers Neuro is alert, Ox 3 , nf, no astereixis   HOme meds: -norvasc 5/ cardura 4mg / lasix 40 qd/ hydralazine 50 bid/ metoprolol 50 bid - indur 60/ ecasa 81/ plavix 75/ tricor/ lovaza/ sl ntg prn -  prozac 20/ allopurinol 100 qd    Impression: 1  Acute on CKD IV - suspect due to decomp CHF (worsening CXR, ^ wts, edema).  Recommend ^lasix IV 80- 120mg  tid for now, add metolazone later if not responding.  No indication for RRT.    2  CKD stage IV - baseline creat 2.8 - 3.5 per family, f/b nephrology in [facility]. Get UA 3  Chest pain / unstable angina - on admit 3/15, better now 4  HTN - on 4 bp meds here as at home, may need to reduce if pt starts to diurese 5  CAD hx CABG 2000/ redo CABG 2011 - EF 50-55 % by echo here 6  HL   Plan - as above  Kelly Splinter MD [employer] pager [phone]   05/09/2017, 3:02 PM

## 2017-05-10 LAB — BASIC METABOLIC PANEL
ANION GAP: 14 (ref 5–15)
BUN: 91 mg/dL — AB (ref 6–20)
CALCIUM: 9.6 mg/dL (ref 8.9–10.3)
CO2: 24 mmol/L (ref 22–32)
Chloride: 95 mmol/L — ABNORMAL LOW (ref 101–111)
Creatinine, Ser: 4.13 mg/dL — ABNORMAL HIGH (ref 0.61–1.24)
GFR calc Af Amer: 15 mL/min — ABNORMAL LOW (ref >60)
GFR, EST NON AFRICAN AMERICAN: 13 mL/min — AB (ref >60)
GLUCOSE: 124 mg/dL — AB (ref 65–99)
POTASSIUM: 4.2 mmol/L (ref 3.5–5.1)
Sodium: 133 mmol/L — ABNORMAL LOW (ref 135–145)

## 2017-05-10 LAB — CBC WITH DIFFERENTIAL/PLATELET
Basophils Absolute: 0 10*3/uL (ref 0.0–0.1)
Basophils Relative: 0 %
EOS PCT: 8 %
Eosinophils Absolute: 0.5 10*3/uL (ref 0.0–0.7)
HEMATOCRIT: 28.5 % — AB (ref 39.0–52.0)
Hemoglobin: 9.6 g/dL — ABNORMAL LOW (ref 13.0–17.0)
LYMPHS PCT: 10 %
Lymphs Abs: 0.7 10*3/uL (ref 0.7–4.0)
MCH: 32.1 pg (ref 26.0–34.0)
MCHC: 33.7 g/dL (ref 30.0–36.0)
MCV: 95.3 fL (ref 78.0–100.0)
MONO ABS: 1 10*3/uL (ref 0.1–1.0)
Monocytes Relative: 14 %
NEUTROS ABS: 4.9 10*3/uL (ref 1.7–7.7)
Neutrophils Relative %: 68 %
PLATELETS: 338 10*3/uL (ref 150–400)
RBC: 2.99 MIL/uL — ABNORMAL LOW (ref 4.22–5.81)
RDW: 14.1 % (ref 11.5–15.5)
WBC: 7.1 10*3/uL (ref 4.0–10.5)

## 2017-05-10 MED ORDER — AMIODARONE HCL 200 MG PO TABS
400.0000 mg | ORAL_TABLET | Freq: Every day | ORAL | Status: DC
  Administered 2017-05-10 – 2017-05-12 (×3): 400 mg via ORAL
  Filled 2017-05-10 (×2): qty 2

## 2017-05-10 NOTE — Progress Notes (Signed)
Progress Note  Patient Name: Nathan Wolfe Date of Encounter: 05/10/2017  Primary Cardiologist: Quay Burow, MD   Subjective   Generally feeling better, walking in room with minimal assistance.  Good urine output after intravenous diuretics, weight down 3 pounds.  Reportedly net -2.7 liters since admission, but weight up roughly 7 pounds from admission.  Inpatient Medications    Scheduled Meds: . acetylcysteine  3 mL Nebulization TID  . allopurinol  100 mg Oral Daily  . amiodarone  400 mg Oral BID  . amLODipine  10 mg Oral Daily  . aspirin EC  81 mg Oral QHS  . brimonidine  1 drop Both Eyes QODAY  . chlorhexidine  15 mL Mouth Rinse BID  . clopidogrel  75 mg Oral Daily  . doxazosin  4 mg Oral Daily  . doxycycline  100 mg Oral Q12H  . enoxaparin (LOVENOX) injection  30 mg Subcutaneous Q24H  . ezetimibe  10 mg Oral Daily  . fenofibrate  160 mg Oral Daily  . FLUoxetine  20 mg Oral Daily  . hydrALAZINE  75 mg Oral BID  . isosorbide mononitrate  120 mg Oral Daily  . mouth rinse  15 mL Mouth Rinse q12n4p  . Melatonin  3 mg Oral QHS  . metoprolol tartrate  50 mg Oral BID  .  morphine injection  2 mg Intravenous Once  . polyethylene glycol  17 g Oral BID  . ranolazine  1,000 mg Oral BID  . senna-docusate  1 tablet Oral BID  . sodium chloride flush  3 mL Intravenous Q12H   Continuous Infusions: . sodium chloride    . ceFEPime (MAXIPIME) IV Stopped (05/09/17 4650)  . furosemide Stopped (05/10/17 3546)  . nitroGLYCERIN Stopped (05/08/17 1300)   PRN Meds: sodium chloride, acetaminophen, ALPRAZolam, benzonatate, diphenhydrAMINE, gi cocktail, hydrALAZINE, ipratropium-albuterol, morphine injection, nitroGLYCERIN, ondansetron (ZOFRAN) IV, sodium chloride flush   Vital Signs    Vitals:   05/10/17 0600 05/10/17 0700 05/10/17 0800 05/10/17 0804  BP: (!) 150/77 132/64 126/60   Pulse: (!) 59 (!) 54 (!) 53   Resp: 19 19 12    Temp:    97.7 F (36.5 C)  TempSrc:    Oral    SpO2: 94% 99% 98%   Weight:      Height:        Intake/Output Summary (Last 24 hours) at 05/10/2017 0911 Last data filed at 05/10/2017 0800 Gross per 24 hour  Intake 486 ml  Output 1555 ml  Net -1069 ml   Filed Weights   05/08/17 0500 05/09/17 0500 05/10/17 0500  Weight: 270 lb 4.5 oz (122.6 kg) 272 lb 4.3 oz (123.5 kg) 269 lb 3.2 oz (122.1 kg)    Telemetry    Normal sinus rhythm with periods of sinus bradycardia down to 49 bpm- Personally Reviewed  ECG    No new tracing - Personally Reviewed  Physical Exam  Obese, looks more comfortable than yesterday. GEN: No acute distress.   Neck:  4-5 cm JVD Cardiac: RRR, no murmurs, rubs, or gallops.  Respiratory: Clear to auscultation bilaterally. GI: Soft, nontender, non-distended  MS:  1+ pedal and ankle symmetrical edema; No deformity. Neuro:  Nonfocal  Psych: Normal affect   Labs    Chemistry Recent Labs  Lab 05/04/17 0303 05/05/17 0245  05/08/17 0259 05/09/17 0352 05/10/17 0259  NA 135 133*   < > 135 132* 133*  K 4.8 4.3   < > 4.5 4.4 4.2  CL 107 103   < >  99* 94* 95*  CO2 16* 19*   < > 21* 24 24  GLUCOSE 105* 118*   < > 127* 135* 124*  BUN 54* 55*   < > 72* 80* 91*  CREATININE 2.72* 2.77*   < > 3.22* 3.81* 4.13*  CALCIUM 8.8* 8.6*   < > 9.2 9.4 9.6  PROT 6.5 6.0*  --   --   --   --   ALBUMIN 3.1* 2.7*  --   --   --   --   AST 42* 36  --   --   --   --   ALT 29 33  --   --   --   --   ALKPHOS 24* 21*  --   --   --   --   BILITOT 0.9 0.9  --   --   --   --   GFRNONAA 22* 21*   < > 18* 14* 13*  GFRAA 25* 25*   < > 20* 17* 15*  ANIONGAP 12 11   < > 15 14 14    < > = values in this interval not displayed.     Hematology Recent Labs  Lab 05/08/17 0259 05/09/17 0352 05/10/17 0259  WBC 6.1 8.4 7.1  RBC 3.04* 3.27* 2.99*  HGB 9.8* 10.7* 9.6*  HCT 29.2* 31.6* 28.5*  MCV 96.1 96.6 95.3  MCH 32.2 32.7 32.1  MCHC 33.6 33.9 33.7  RDW 14.0 14.3 14.1  PLT 228 304 338    Cardiac Enzymes Recent Labs  Lab  05/07/17 2202 05/08/17 0833 05/08/17 1520 05/08/17 1833  TROPONINI 4.49* 3.32* 2.62* 2.50*   No results for input(s): TROPIPOC in the last 168 hours.   BNPNo results for input(s): BNP, PROBNP in the last 168 hours.   DDimer No results for input(s): DDIMER in the last 168 hours.   Radiology    Dg Chest Port 1 View  Result Date: 05/09/2017 CLINICAL DATA:  Shortness of breath. EXAM: PORTABLE CHEST 1 VIEW COMPARISON:  Chest x-ray from yesterday. FINDINGS: Stable cardiomegaly and vascular congestion. Patchy airspace opacities in the right greater than left lungs are similar to prior study. Unchanged small right pleural effusion. No pneumothorax. No acute osseous abnormality. IMPRESSION: Stable vascular congestion and patchy right greater than left airspace opacities which could reflect edema or infection. Electronically Signed   By: Titus Dubin M.D.   On: 05/09/2017 07:54    Cardiac Studies   Echo3/17/19: Study Conclusions  - Left ventricle: The cavity size was normal. Wall thickness was increased in a pattern of mild LVH. Systolic function was normal. The estimated ejection fraction was in the range of 50% to 55%. Mild hypokinesis of the anteroseptal myocardium. Features are consistent with a pseudonormal left ventricular filling pattern, with concomitant abnormal relaxation and increased filling pressure (grade 2 diastolic dysfunction). Doppler parameters are consistent with high ventricular filling pressure. - Aortic valve: Transvalvular velocity was within the normal range. There was no stenosis. There was no regurgitation. - Mitral valve: Transvalvular velocity was within the normal range. There was no evidence for stenosis. There was trivial regurgitation. - Left atrium: The atrium was severely dilated. - Right ventricle: The cavity size was normal. Wall thickness was normal. Systolic function was normal. - Atrial septum: No defect or patent  foramen ovale was identified by color flow Doppler. - Tricuspid valve: There was mild regurgitation. - Pulmonary arteries: Systolic pressure was moderately to severely increased. PA peak pressure: 59 mm Hg (S).  Cardiac cath in March 2016 as per chart review from outside facility. Last catheterization was March 2016."His EFwasnormal. He has a patent LIMA to the LAD with an occluded LAD beyond LIMA insertion. He has a patent vein graft to the right status post stenting of the PDA with a 2.5 mm Synergy drug-eluting stent to the vein graft and a diseased ramus branch was with not intervened on."    Patient Profile     72 y.o. male with extensive coronary artery disease and previous redo bypass surgery (2000, 2011) as well as subsequent drug-eluting stent in SVG to the PDA (2016), with known occlusion of LAD beyond LIMA bypass, severely diseased ramus intermedius branch not amenable to further revascularization, admitted with influenza pneumonia complicated by small non-STEMI and heart failure exacerbation, with acute worsening of chronic kidney disease stage IV following diuretics    Assessment & Plan    1. CAD s/p NSTEMI: Benefit of catheterization is low (based on known anatomy), risks of contrast nephrotoxicity are very high.  Plan medical management.  Currently angina free.  Very likely he had demand ischemia rather than a true acute ruptured plaque/thrombotic event. 2.  AFib: Brief A. Fib 2 nights ago, no recurrence;  he is becoming increasingly bradycardic, will decrease the amiodarone to once daily today, to decrease to maintenance amiodarone 200 mg daily dose on March 26.   Would like to keep him on the current dose of beta-blocker if possible for its antianginal/anti-ischemic properties Note severely dilated left atrium, which increases the risk of recurrence.  The episode was brief and he is not yet on anticoagulation.  The risks of "triple therapy" with an additional anticoagulant  may outweigh the benefits. 3.  Ac on CKD: Had good diuresis yesterday, with slight further deterioration in creatinine. Seen by Dr. Serafina Royals. 4.  Acute respiratory insufficiency with hypoxia: Primarily related to influenza on background of chronic diastolic heart failure.  He is still on 4-5 L O2 by nasal cannula 5. CHF: Obesity and comorbid conditions make it hard to assess his volume status based on physical exam or chest x-ray.  Weight has been essentially unchanged over the last week.  BNP was minimally elevated on arrival at 366, baseline unknown.  Echo on March 15 did show findings suggestive of volume overload, but he has preserved left ventricular systolic function with EF 50-55%.  Severely dilated left atrium consistent with chronic heart failure.  Moderate pulmonary hypertension may be related to diastolic heart failure, but also to obesity, possible obstructive sleep apnea, acute lung disease.  It is a challenge to maintain the appropriate "dry weight" balance between heart failure and renal failure. He is still about 8 lb above his admission weight. 6.  HTN: Control is good.  Avoid RAAS inhibitors. 7. Deconditioning: Discussed with daughter.  She foresees his coming home with her for home physical therapy and rehabilitation before he returns to [place].  They would like to avoid inpatient rehab.    For questions or updates, please contact [place] Please consult [email or web address] for contact info under Cardiology/STEMI.      Signed, Sanda Klein, MD  05/10/2017, 9:11 AM

## 2017-05-10 NOTE — Progress Notes (Signed)
Pt has home CPAP and places himself on and off with help of family or RN. RT available if needed.

## 2017-05-10 NOTE — Progress Notes (Signed)
[facility] Progress Note  Subjective: 1.6 L out yest, wt down a little, creat up 4.1  Vitals:   05/10/17 0700 05/10/17 0800 05/10/17 0804 05/10/17 1100  BP: 132/64 126/60  122/81  Pulse: (!) 54 (!) 53  (!) 51  Resp: 19 12  18   Temp:   97.7 F (36.5 C)   TempSrc:   Oral   SpO2: 99% 98%  97%  Weight:      Height:        Inpatient medications: . acetylcysteine  3 mL Nebulization TID  . allopurinol  100 mg Oral Daily  . amiodarone  400 mg Oral Daily  . amLODipine  10 mg Oral Daily  . aspirin EC  81 mg Oral QHS  . brimonidine  1 drop Both Eyes QODAY  . chlorhexidine  15 mL Mouth Rinse BID  . clopidogrel  75 mg Oral Daily  . doxazosin  4 mg Oral Daily  . doxycycline  100 mg Oral Q12H  . enoxaparin (LOVENOX) injection  30 mg Subcutaneous Q24H  . ezetimibe  10 mg Oral Daily  . fenofibrate  160 mg Oral Daily  . FLUoxetine  20 mg Oral Daily  . hydrALAZINE  75 mg Oral BID  . isosorbide mononitrate  120 mg Oral Daily  . mouth rinse  15 mL Mouth Rinse q12n4p  . Melatonin  3 mg Oral QHS  . metoprolol tartrate  50 mg Oral BID  . polyethylene glycol  17 g Oral BID  . ranolazine  1,000 mg Oral BID  . senna-docusate  1 tablet Oral BID  . sodium chloride flush  3 mL Intravenous Q12H   . sodium chloride    . ceFEPime (MAXIPIME) IV Stopped (05/09/17 7824)  . furosemide Stopped (05/10/17 2353)  . nitroGLYCERIN Stopped (05/08/17 1300)   sodium chloride, acetaminophen, ALPRAZolam, benzonatate, diphenhydrAMINE, gi cocktail, hydrALAZINE, ipratropium-albuterol, nitroGLYCERIN, ondansetron (ZOFRAN) IV, sodium chloride flush  Exam: Gen alert, nasal O2 +Jvd Chest bilat insp rales 1/3 up post L and R RRR no MRG Abd soft ntnd no mass or ascites +bs obese Ext 2+ bilat pitting LE edema Neuro is alert, Ox 3 , nf   Home meds: -norvasc 5/ cardura 4mg / lasix 40 qd/ hydralazine 50 bid/ metoprolol 50 bid - indur 60/ ecasa 81/ plavix 75/ tricor/ lovaza/ sl ntg prn - prozac 20/  allopurinol 100 qd    Impression: 1  Acute on CKD IV - suspect due to decomp CHF. Looks better today, getting OOB.  Seems to be diuresing now but not collecting all UOP per dtr. Will cont high dose IV lasix, suspect renal function will level off soon. No indication for RRT.  Not sure if he will be staying here long-term or not, will restrict R arm for future access but would not suggest AVF placement just yet as he has people in [facility] that can do this.  2  CKD stage IV - baseline creat 2.8 - 3.5 per family, f/b nephrology in [facility]. Get UA 3  Chest pain / unstable angina - on admit 3/15, better now 4  HTN - on 4 bp meds here as at home, may need to reduce if pt starts to diurese 5  CAD hx CABG 2000/ redo CABG 2011 - EF 50-55 % by echo here 6  HL   Plan - as above   Kelly Splinter MD [place] pager [phone]   05/10/2017, 11:27 AM   Recent Labs  Lab 05/08/17 0259 05/09/17 0352  05/10/17 0259  NA 135 132* 133*  K 4.5 4.4 4.2  CL 99* 94* 95*  CO2 21* 24 24  GLUCOSE 127* 135* 124*  BUN 72* 80* 91*  CREATININE 3.22* 3.81* 4.13*  CALCIUM 9.2 9.4 9.6   Recent Labs  Lab 05/04/17 0303 05/05/17 0245  AST 42* 36  ALT 29 33  ALKPHOS 24* 21*  BILITOT 0.9 0.9  PROT 6.5 6.0*  ALBUMIN 3.1* 2.7*   Recent Labs  Lab 05/08/17 0259 05/09/17 0352 05/10/17 0259  WBC 6.1 8.4 7.1  NEUTROABS 3.8 6.0 4.9  HGB 9.8* 10.7* 9.6*  HCT 29.2* 31.6* 28.5*  MCV 96.1 96.6 95.3  PLT 228 304 338   Iron/TIBC/Ferritin/ %Sat No results found for: IRON, TIBC, FERRITIN, IRONPCTSAT

## 2017-05-10 NOTE — Progress Notes (Signed)
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 Patient reports symptoms did not feel like acid reflux. Reports pain radiating into his jaw similar to previous heart attacks. Patient reports taking approximately 4 nitros with just temporary relief of symptoms before return of pain symptoms. In the ER, pt was noted to be flu positive, flat troponin, admitted for further management.  Today, pt noted with some improvement, no further CP episodes, dyspnea improving, no fever/chills noted.  Worsening renal function, nephrology consulted.  05/10/2017: Patient seen alongside patient's daughter.  Also discussed extensively with the patient's nurse.  No further cardiac workup is planned as per collateral information.  Patient will be managed medically.  I understand that palliative care team has been consulted.  Patient is being aggressively diuresed.  Patient is on IV Lasix 120 mg every 8 hourly.  Leg edema is improving.  According to the patient's daughter, the patient is able to do much today.  Patient is still on significant IV antibiotics.  Will have preferred to proceed with CT scan of the chest without contrast, but the patient cannot lay down flat.  Renal function is been monitored closely.  Overall, the patient's prognosis is guarded.   Assessment/Plan: Principal Problem:   Anxiety Active Problems:   Unstable angina (HCC)   Anemia due to chronic kidney disease   Essential hypertension   OSA on CPAP   NSTEMI (non-ST elevated myocardial infarction) ([place])  Demand ischemia (HCC)   Influenza A   Acute respiratory failure with hypoxia (HCC)   Pulmonary edema   Paroxysmal atrial fibrillation (HCC)   Acute on chronic diastolic heart failure (HCC)   Acute renal failure superimposed on stage 4 chronic kidney disease (HCC)   Chest pain   Goals of care, counseling/discussion   Palliative care by specialist  Sepsis 2/2 Influenza A with possible PNA Last temp 102.2 on 3/20, no luekocytosis BC X 2 NGTD, repeat NGTD Procalcitonin trending downwards Sputum cx with few Gram + cocci in clusters, few gram - rods. MRSA PCR negative CXR: with stable pulm edema Vs PNA Completed tamiflu Continue cefepime, doxycycline due to ?influenza PNA, although MRSA PCR neg 05/10/2017: We will proceed with CT scan of the chest without contrast when patient is able to lay down flat.  If CT scan of the chest is negative for pneumonia, will de-escalate patient's antibiotics.  Unstable angina/NSTEMI No further chest pain Extensive cardiac hx as mentioned above Trop peaked at 4.49, now down trending, EKG with t wave changes  ECHO with EF of 50-55%, Grade 2 DD, no RWMA Cardiology on board: added ranexa 500 mg BID. Still a poor candidate for cardiac cath due to extensive prior occlusions, with a low chance of successful revascularization, marginal resp status, worsening CKD Continue ASA, plavix, imdur, metoprolol, NTG prn Monitor closely  Acute on chronic diastolic HF Repeat CXR with stable pulm edema ECHO as above Aggressive diuresis.  AKI on CKD stage IV Creatinine of 3.4 on admission-->currently rising Follows with nephrology in [place], not on HD Nephrology consulted 05/10/2017-currently see above.  For aggressive diuresis.  P.Afib Currently HR controlled Stopped IV amiodarone, switched to PO amiodarone Cardiology on board,  contemplating AC, ?possible NOAC on discharge  Essential hypertension Stable Continue metoprolol (50 BID), hydralazine (increased to 75  BID), amlodipine (increased to 10), imdur, lasix  Anemia of chronic disease Stable, monitor closely  BPH Continue Cardura  Dyslipidemia Continue fenofibrate, started zetia  OSA on CPAP CPAP at night  Anxiety Continue xanax prn Psych consulted, no further recs  GOC Palliative consulted, continue full scope of treatment/management except heart surgery     Code Status: Full  Family Communication: Daughter at bedside  Disposition Plan: Once work-up complete, and patient improving steadily   Consultants:  Cardiology  Nephrology  Procedures:  None  Antimicrobials:  Cefepime  Doxcycline   Tamiflu   DVT prophylaxis:  Lovenox   Objective: Vitals:   05/10/17 0600 05/10/17 0700 05/10/17 0800 05/10/17 0804  BP: (!) 150/77 132/64 126/60   Pulse: (!) 59 (!) 54 (!) 53   Resp: 19 19 12    Temp:    97.7 F (36.5 C)  TempSrc:    Oral  SpO2: 94% 99% 98%   Weight:      Height:        Intake/Output Summary (Last 24 hours) at 05/10/2017 1059 Last data filed at 05/10/2017 0800 Gross per 24 hour  Intake 486 ml  Output 1455 ml  Net -969 ml   Filed Weights   05/08/17 0500 05/09/17 0500 05/10/17 0500  Weight: 122.6 kg (270 lb 4.5 oz) 123.5 kg (272 lb 4.3 oz) 122.1 kg (269 lb 3.2 oz)    Exam:   General:  NAD   Cardiovascular: S1, S2 present  Respiratory: Mild crackles noted b/l at the bases  Abdomen: Soft, NT, ND, BS present  Musculoskeletal: No pedal edema  Skin: Normal  Psychiatry: Normal mood   Data Reviewed: CBC: Recent Labs  Lab 05/06/17 0304 05/07/17 0304 05/08/17 0259 05/09/17 0352 05/10/17 0259  WBC 6.5 8.5 6.1 8.4 7.1  NEUTROABS  --   --  3.8 6.0 4.9  HGB 9.4* 9.8* 9.8* 10.7* 9.6*  HCT 27.8* 29.3* 29.2* 31.6* 28.5*  MCV 96.5 96.1 96.1 96.6 95.3  PLT 152 212 228 304 149   Basic Metabolic Panel: Recent Labs  Lab 05/04/17 0303 05/05/17 0245 05/06/17 0304 05/07/17 0304 05/08/17 0259 05/09/17 0352 05/10/17 0259  NA  135 133* 133* 134* 135 132* 133*  K 4.8 4.3 4.2 4.2 4.5 4.4 4.2  CL 107 103 101 100* 99* 94* 95*  CO2 16* 19* 22 22 21* 24 24  GLUCOSE 105* 118* 112* 196* 127* 135* 124*  BUN 54* 55* 55* 63* 72* 80* 91*  CREATININE 2.72* 2.77* 2.85* 2.94* 3.22* 3.81* 4.13*  CALCIUM 8.8* 8.6* 8.6* 8.9 9.2 9.4 9.6  MG 2.1 2.4  --   --   --   --   --    GFR: Estimated Creatinine Clearance: 19.9 mL/min (A) (by C-G formula based on SCr of 4.13 mg/dL (H)). Liver Function Tests: Recent Labs  Lab 05/04/17 0303 05/05/17 0245  AST 42* 36  ALT 29 33  ALKPHOS 24* 21*  BILITOT 0.9 0.9  PROT 6.5 6.0*  ALBUMIN 3.1* 2.7*   No results for input(s): LIPASE, AMYLASE in the last 168 hours. No results for input(s): AMMONIA in the last 168 hours. Coagulation Profile: No results for input(s): INR, PROTIME in the last 168 hours. Cardiac Enzymes: Recent Labs  Lab 05/07/17 1542 05/07/17 2202 05/08/17 0833 05/08/17 1520 05/08/17 1833  TROPONINI 4.22* 4.49* 3.32* 2.62* 2.50*   BNP (last 3 results) No results  for input(s): PROBNP in the last 8760 hours. HbA1C: No results for input(s): HGBA1C in the last 72 hours. CBG: No results for input(s): GLUCAP in the last 168 hours. Lipid Profile: No results for input(s): CHOL, HDL, LDLCALC, TRIG, CHOLHDL, LDLDIRECT in the last 72 hours. Thyroid Function Tests: No results for input(s): TSH, T4TOTAL, FREET4, T3FREE, THYROIDAB in the last 72 hours. Anemia Panel: No results for input(s): VITAMINB12, FOLATE, FERRITIN, TIBC, IRON, RETICCTPCT in the last 72 hours. Urine analysis: No results found for: COLORURINE, APPEARANCEUR, LABSPEC, [place], GLUCOSEU, HGBUR, BILIRUBINUR, KETONESUR, PROTEINUR, UROBILINOGEN, NITRITE, LEUKOCYTESUR Sepsis Labs: @LABRCNTIP (procalcitonin:4,lacticidven:4)  ) Recent Results (from the past 240 hour(s))  Culture, blood (routine x 2)     Status: None   Collection Time: 05/02/17  1:06 AM  Result Value Ref Range Status   Specimen Description  BLOOD RIGHT HAND  Final   Special Requests   Final    BOTTLES DRAWN AEROBIC AND ANAEROBIC Blood Culture results may not be optimal due to an inadequate volume of blood received in culture bottles   Culture   Final    NO GROWTH 5 DAYS Performed at [facility], [place] [address] [place], [place] [address]    Report Status 05/07/2017 FINAL  Final  Culture, blood (routine x 2)     Status: None   Collection Time: 05/02/17  1:07 AM  Result Value Ref Range Status   Specimen Description BLOOD RIGHT HAND  Final   Special Requests   Final    BOTTLES DRAWN AEROBIC AND ANAEROBIC Blood Culture results may not be optimal due to an inadequate volume of blood received in culture bottles   Culture   Final    NO GROWTH 5 DAYS Performed at [facility], [place] [address] [place], [place] [address]    Report Status 05/07/2017 FINAL  Final  MRSA PCR Screening     Status: None   Collection Time: 05/02/17  9:45 AM  Result Value Ref Range Status   MRSA by PCR NEGATIVE NEGATIVE Final    Comment:        The GeneXpert MRSA Assay (FDA approved for NASAL specimens only), is one component of a comprehensive MRSA colonization surveillance program. It is not intended to diagnose MRSA infection nor to guide or monitor treatment for MRSA infections. Performed at [facility], [place] [address] [place], [place] [address]   MRSA PCR Screening     Status: None   Collection Time: 05/04/17  5:15 PM  Result Value Ref Range Status   MRSA by PCR NEGATIVE NEGATIVE Final    Comment:        The GeneXpert MRSA Assay (FDA approved for NASAL specimens only), is one component of a comprehensive MRSA colonization surveillance program. It is not intended to diagnose MRSA infection nor to guide or monitor treatment for MRSA infections. Performed at [facility], [place] [address], [place], [place] [address]   Culture, expectorated sputum-assessment     Status: None   Collection Time: 05/04/17  6:06  PM  Result Value Ref Range Status   Specimen Description EXPECTORATED SPUTUM  Final   Special Requests NONE  Final   Sputum evaluation   Final    THIS SPECIMEN IS ACCEPTABLE FOR SPUTUM CULTURE Performed at [facility], [place] [address] [place], [place] [address]    Report Status 05/04/2017 FINAL  Final  Culture, respiratory (NON-Expectorated)     Status: None   Collection Time: 05/04/17  6:06 PM  Result Value Ref Range Status   Specimen Description EXPECTORATED SPUTUM  Final   Special Requests NONE Reflexed from [id number]  Final   Gram Stain   Final    FEW WBC PRESENT, PREDOMINANTLY PMN FEW SQUAMOUS EPITHELIAL CELLS PRESENT FEW GRAM POSITIVE COCCI IN CLUSTERS FEW GRAM NEGATIVE RODS RARE GRAM POSITIVE RODS    Culture   Final    Consistent with normal respiratory flora. Performed at [facility], [place] [address], [place], [place] [address]    Report Status 05/07/2017 FINAL  Final  Culture, blood (routine x 2) Call MD if unable to obtain prior to antibiotics being given     Status: None   Collection Time: 05/04/17  8:30 PM  Result Value Ref Range Status   Specimen Description BLOOD RIGHT HAND  Final   Special Requests Blood Culture adequate volume BLOOD AEROBIC BOTTLE  Final   Culture   Final    NO GROWTH 5 DAYS Performed at [facility], [place] [address], [place], [place] [address]    Report Status 05/09/2017 FINAL  Final  Culture, blood (routine x 2) Call MD if unable to obtain prior to antibiotics being given     Status: None   Collection Time: 05/04/17  8:35 PM  Result Value Ref Range Status   Specimen Description BLOOD RIGHT HAND  Final   Special Requests BLOOD AEROBIC BOTTLE Blood Culture adequate volume  Final   Culture   Final    NO GROWTH 5 DAYS Performed at [facility], [place] [address] [place], [place] [address]    Report Status 05/09/2017 FINAL  Final  Culture, blood (routine x 2)     Status: None (Preliminary result)   Collection Time:  05/07/17  4:38 PM  Result Value Ref Range Status   Specimen Description BLOOD RIGHT HAND  Final   Special Requests   Final    BOTTLES DRAWN AEROBIC ONLY Blood Culture adequate volume   Culture   Final    NO GROWTH 2 DAYS Performed at [facility], [place] [address], [place], [place] [address]    Report Status PENDING  Incomplete  Culture, blood (routine x 2)     Status: None (Preliminary result)   Collection Time: 05/07/17  4:43 PM  Result Value Ref Range Status   Specimen Description BLOOD LEFT HAND  Final   Special Requests   Final    BOTTLES DRAWN AEROBIC ONLY Blood Culture adequate volume   Culture   Final    NO GROWTH 2 DAYS Performed at [facility], [place] [address], [place], [place] [address]    Report Status PENDING  Incomplete      Studies: No results found.  Scheduled Meds: . acetylcysteine  3 mL Nebulization TID  . allopurinol  100 mg Oral Daily  . amiodarone  400 mg Oral Daily  . amLODipine  10 mg Oral Daily  . aspirin EC  81 mg Oral QHS  . brimonidine  1 drop Both Eyes QODAY  . chlorhexidine  15 mL Mouth Rinse BID  . clopidogrel  75 mg Oral Daily  . doxazosin  4 mg Oral Daily  . doxycycline  100 mg Oral Q12H  . enoxaparin (LOVENOX) injection  30 mg Subcutaneous Q24H  . ezetimibe  10 mg Oral Daily  . fenofibrate  160 mg Oral Daily  . FLUoxetine  20 mg Oral Daily  . hydrALAZINE  75 mg Oral BID  .  isosorbide mononitrate  120 mg Oral Daily  . mouth rinse  15 mL Mouth Rinse q12n4p  . Melatonin  3 mg Oral QHS  . metoprolol tartrate  50 mg Oral BID  . polyethylene glycol  17 g Oral BID  . ranolazine  1,000 mg Oral BID  . senna-docusate  1 tablet Oral BID  . sodium chloride flush  3 mL Intravenous Q12H    Continuous Infusions: . sodium chloride    . ceFEPime (MAXIPIME) IV Stopped (05/09/17 6283)  . furosemide Stopped (05/10/17 1517)  . nitroGLYCERIN Stopped (05/08/17 1300)     LOS: 9 days     Bonnell Public, MD [facility]   If 7PM-7AM, please contact night-coverage [email or web address] Password [facility] 05/10/2017, 10:59 AM

## 2017-05-11 ENCOUNTER — Inpatient Hospital Stay (HOSPITAL_COMMUNITY): Payer: Medicare Other

## 2017-05-11 DIAGNOSIS — I6521 Occlusion and stenosis of right carotid artery: Secondary | ICD-10-CM

## 2017-05-11 DIAGNOSIS — N184 Chronic kidney disease, stage 4 (severe): Secondary | ICD-10-CM

## 2017-05-11 LAB — CBC WITH DIFFERENTIAL/PLATELET
Basophils Absolute: 0 10*3/uL (ref 0.0–0.1)
Basophils Relative: 0 %
EOS ABS: 0.5 10*3/uL (ref 0.0–0.7)
Eosinophils Relative: 8 %
HEMATOCRIT: 29.3 % — AB (ref 39.0–52.0)
HEMOGLOBIN: 10 g/dL — AB (ref 13.0–17.0)
LYMPHS ABS: 0.9 10*3/uL (ref 0.7–4.0)
Lymphocytes Relative: 12 %
MCH: 32.1 pg (ref 26.0–34.0)
MCHC: 34.1 g/dL (ref 30.0–36.0)
MCV: 93.9 fL (ref 78.0–100.0)
MONO ABS: 1 10*3/uL (ref 0.1–1.0)
MONOS PCT: 14 %
NEUTROS ABS: 4.6 10*3/uL (ref 1.7–7.7)
Neutrophils Relative %: 66 %
Platelets: 402 10*3/uL — ABNORMAL HIGH (ref 150–400)
RBC: 3.12 MIL/uL — ABNORMAL LOW (ref 4.22–5.81)
RDW: 14.1 % (ref 11.5–15.5)
WBC: 7 10*3/uL (ref 4.0–10.5)

## 2017-05-11 LAB — IRON AND TIBC
Iron: 63 ug/dL (ref 45–182)
Saturation Ratios: 20 % (ref 17.9–39.5)
TIBC: 308 ug/dL (ref 250–450)
UIBC: 245 ug/dL

## 2017-05-11 LAB — BASIC METABOLIC PANEL
Anion gap: 19 — ABNORMAL HIGH (ref 5–15)
BUN: 106 mg/dL — ABNORMAL HIGH (ref 6–20)
CO2: 19 mmol/L — ABNORMAL LOW (ref 22–32)
Calcium: 9.8 mg/dL (ref 8.9–10.3)
Chloride: 93 mmol/L — ABNORMAL LOW (ref 101–111)
Creatinine, Ser: 4.3 mg/dL — ABNORMAL HIGH (ref 0.61–1.24)
GFR calc Af Amer: 14 mL/min — ABNORMAL LOW (ref >60)
GFR calc non Af Amer: 12 mL/min — ABNORMAL LOW (ref >60)
Glucose, Bld: 158 mg/dL — ABNORMAL HIGH (ref 65–99)
Potassium: 4.5 mmol/L (ref 3.5–5.1)
Sodium: 131 mmol/L — ABNORMAL LOW (ref 135–145)

## 2017-05-11 LAB — ALBUMIN: Albumin: 3.2 g/dL — ABNORMAL LOW (ref 3.5–5.0)

## 2017-05-11 NOTE — Consult Note (Addendum)
[name] NOTE   MRN : [id number]  Reason for Consult: CKD stage IV/V HD access +/[facility] Referring Physician: Dr. Melvia Heaps  History of Present Illness: 72 y/o male her on a 3 week vacation to visit his daughter.  He presented to the ED with CP.  During his cardiac work up he noted he has CKD stage IV/V and is followed by a Nephrology group in [place].  He and his daughter are not sure if they want to proceed with access surgery here or when he returns to [place].  He is left hand dominant.   Past medical history includes: CABG x 2, multiple stents in the past, CKD, right carotid occlusion, s/p left CEA 09/2016, HTN, Hyperlipidemia, and COPD.  Current admission S/P NSTEMI, new A fib managed with amiodarone cureently, and FLU.     Current Facility-Administered Medications  Medication Dose Route Frequency Provider Last Rate Last Dose  . 0.9 %  sodium chloride infusion  250 mL Intravenous PRN Skeet Latch, MD      . acetaminophen (TYLENOL) tablet 650 mg  650 mg Oral Q4H PRN Fuller Plan A, MD   650 mg at 05/05/17 2352  . acetylcysteine (MUCOMYST) 20 % nebulizer / oral solution 3 mL  3 mL Nebulization TID Alma Friendly, MD   3 mL at 05/10/17 0831  . allopurinol (ZYLOPRIM) tablet 100 mg  100 mg Oral Daily Tamala Julian, Rondell A, MD   100 mg at 05/11/17 0941  . ALPRAZolam Duanne Moron) tablet 0.5 mg  0.5 mg Oral TID PRN Elodia Florence., MD   0.5 mg at 05/10/17 2142  . amiodarone (PACERONE) tablet 400 mg  400 mg Oral Daily Croitoru, Mihai, MD   400 mg at 05/11/17 0942  . amLODipine (NORVASC) tablet 10 mg  10 mg Oral Daily Reino Bellis B, NP   10 mg at 05/11/17 0942  . aspirin EC tablet 81 mg  81 mg Oral QHS Fuller Plan A, MD   81 mg at 05/10/17 2143  . benzonatate (TESSALON) capsule 100 mg  100 mg Oral TID PRN Elodia Florence., MD   100 mg at 05/08/17 1051  . brimonidine (ALPHAGAN) 0.2 % ophthalmic solution 1 drop  1 drop Both Eyes QODAY  Smith, Rondell A, MD   1 drop at 05/11/17 0943  . ceFEPIme (MAXIPIME) 1 g in sodium chloride 0.9 % 100 mL IVPB  1 g Intravenous Q24H Cristal Ford, DO   Stopped at 05/10/17 2050  . chlorhexidine (PERIDEX) 0.12 % solution 15 mL  15 mL Mouth Rinse BID Elodia Florence., MD   15 mL at 05/11/17 [phone]  . clopidogrel (PLAVIX) tablet 75 mg  75 mg Oral Daily Fuller Plan A, MD   75 mg at 05/11/17 0942  . diphenhydrAMINE (BENADRYL) capsule 25 mg  25 mg Oral QHS PRN Fuller Plan A, MD   25 mg at 05/05/17 2109  . doxazosin (CARDURA) tablet 4 mg  4 mg Oral Daily Fuller Plan A, MD   4 mg at 05/11/17 0943  . doxycycline (VIBRA-TABS) tablet 100 mg  100 mg Oral Q12H Alma Friendly, MD   100 mg at 05/11/17 0943  . enoxaparin (LOVENOX) injection 30 mg  30 mg Subcutaneous Q24H Martin[place], Peter M, MD   30 mg at 05/10/17 1029  . ezetimibe (ZETIA) tablet 10 mg  10 mg Oral Daily Reino Bellis B, NP   10 mg at 05/11/17 0942  . fenofibrate  tablet 160 mg  160 mg Oral Daily Fuller Plan A, MD   160 mg at 05/11/17 0943  . FLUoxetine (PROZAC) capsule 20 mg  20 mg Oral Daily Smith, Rondell A, MD   20 mg at 05/11/17 0942  . furosemide (LASIX) 120 mg in dextrose 5 % 50 mL IVPB  120 mg Intravenous Q8H Roney Jaffe, MD   Stopped at 05/11/17 [phone]  . gi cocktail (Maalox,Lidocaine,Donnatal)  30 mL Oral QID PRN Fuller Plan A, MD      . hydrALAZINE (APRESOLINE) injection 10 mg  10 mg Intravenous Q4H PRN Fuller Plan A, MD   10 mg at 05/01/17 1553  . hydrALAZINE (APRESOLINE) tablet 75 mg  75 mg Oral BID Reino Bellis B, NP   75 mg at 05/11/17 1191  . ipratropium-albuterol (DUONEB) 0.5-2.5 (3) MG/3ML nebulizer solution 3 mL  3 mL Nebulization Q2H PRN Alma Friendly, MD   3 mL at 05/10/17 0831  . isosorbide mononitrate (IMDUR) 24 hr tablet 120 mg  120 mg Oral Daily Martinique, Peter M, MD   120 mg at 05/11/17 0941  . MEDLINE mouth rinse  15 mL Mouth Rinse q12n4p Elodia Florence., MD   15 mL at  05/10/17 1630  . Melatonin TABS 3 mg  3 mg Oral QHS Smith, Rondell A, MD   3 mg at 05/10/17 2142  . metoprolol tartrate (LOPRESSOR) tablet 50 mg  50 mg Oral BID Fuller Plan A, MD   50 mg at 05/11/17 0942  . nitroGLYCERIN (NITROSTAT) SL tablet 0.4 mg  0.4 mg Sublingual Q5 min PRN Chriss Czar, MD   0.4 mg at 05/06/17 2144  . nitroGLYCERIN 50 mg in dextrose 5 % 250 mL (0.2 mg/mL) infusion  0-200 mcg/min Intravenous Titrated Larey Dresser, MD   Stopped at 05/08/17 1300  . ondansetron (ZOFRAN) injection 4 mg  4 mg Intravenous Q6H PRN Fuller Plan A, MD   4 mg at 05/06/17 2229  . polyethylene glycol (MIRALAX / GLYCOLAX) packet 17 g  17 g Oral BID Elodia Florence., MD   17 g at 05/11/17 1030  . ranolazine (RANEXA) 12 hr tablet 1,000 mg  1,000 mg Oral BID [place]ique, Peter M, MD   1,000 mg at 05/11/17 4782  . senna-docusate (Senokot-S) tablet 1 tablet  1 tablet Oral BID Alma Friendly, MD   1 tablet at 05/10/17 2141  . sodium chloride flush (NS) 0.9 % injection 3 mL  3 mL Intravenous Q12H Skeet Latch, MD   3 mL at 05/10/17 2144  . sodium chloride flush (NS) 0.9 % injection 3 mL  3 mL Intravenous PRN Skeet Latch, MD        Pt meds include: Statin :No Betablocker: Yes ASA: Yes Other anticoagulants/antiplatelets: plavix  Past Medical History:  Diagnosis Date  . CAD (coronary artery disease)   . Carotid artery stenosis   . Chronic diastolic heart failure ([place])    noted on echo 05/03/2017 (EF 50-55%)  . Chronic kidney disease (CKD), stage IV (severe) ([place])   . Morbid obesity ([place])   . OSA on CPAP     Past Surgical History:  Procedure Laterality Date  . CAROTID ENDARTERECTOMY Left 09/2016  . CORONARY ARTERY BYPASS GRAFT      Social History Social History   Tobacco Use  . Smoking status: Unknown If Ever Smoked  . Smokeless tobacco: Never Used  Substance Use Topics  . Alcohol use: Not on file  . Drug use:  Not on file    Family History History reviewed.  No pertinent family history.  Unknown  Allergies  Allergen Reactions  . Statins Other (See Comments)    Full body muscle stuffiness/pain     REVIEW OF SYSTEMS  General: [ ]  Weight loss, [ ]  Fever, [ ]  chills Neurologic: [ ]  Dizziness, [ ]  Blackouts, [ ]  Seizure [ ]  Stroke, [ ]  "Mini stroke", [ ]  Slurred speech, [ ]  Temporary blindness; [ ]  weakness in arms or legs, [ ]  Hoarseness [ ]  Dysphagia Cardiac: [x ] Chest pain/pressure, [x ] Shortness of breath at rest [ ]  Shortness of breath with exertion, [x ] Atrial fibrillation or irregular heartbeat  Vascular: [ ]  Pain in legs with walking, [ ]  Pain in legs at rest, [ ]  Pain in legs at night,  [ ]  Non-healing ulcer, [ ]  Blood clot in vein/DVT,   Pulmonary: [ ]  Home oxygen, [ ]  Productive cough, [ ]  Coughing up blood, [ ]  Asthma,  [ ]  Wheezing x[ ]  COPD Musculoskeletal:  [ ]  Arthritis, [ ]  Low back pain, [ ]  Joint pain Hematologic: [ ]  Easy Bruising, [ ]  Anemia; [ ]  Hepatitis Gastrointestinal: [ ]  Blood in stool, [ ]  Gastroesophageal Reflux/heartburn, Urinary: [ ]  chronic Kidney disease, [ ]  on HD - [ ]  MWF or [ ]  TTHS, [ ]  Burning with urination, [ ]  Difficulty urinating Skin: [ ]  Rashes, [ ]  Wounds Psychological: [ ]  Anxiety, [ ]  Depression  Physical Examination Vitals:   05/11/17 0800 05/11/17 0900 05/11/17 0934 05/11/17 1000  BP:   135/70   Pulse: (!) 56 (!) 54 (!) 56 63  Resp: (!) 24 (!) 24 (!) 24 (!) 25  Temp:      TempSrc:      SpO2: 97% 96% 100% 98%  Weight:      Height:       Body mass index is 41.08 kg/m.  General:  WDWN in NAD HENT: WNL, normocephalic Eyes: Pupils equal Pulmonary: normal non-labored breathing , without Rales, rhonchi,  Positive wheezing Cardiac: sinus brady, without  Murmurs, rubs or gallops; No carotid bruits, occluded right ICA Abdomen: soft, NT, no masses, well healed chest scar s/p CABG Skin: no rashes, ulcers noted;  no Gangrene , no cellulitis; no open wounds;   Vascular  Exam/Pulses:Palpable radial, brachial pulses   Musculoskeletal: no muscle wasting or atrophy; positive edema  Neurologic: A&O X 3; Appropriate Affect ;  SENSATION: normal; MOTOR FUNCTION: 4+/5 Symmetric Speech is fluent/normal   Significant Diagnostic Studies: CBC Lab Results  Component Value Date   WBC 7.0 05/11/2017   HGB 10.0 (L) 05/11/2017   HCT 29.3 (L) 05/11/2017   MCV 93.9 05/11/2017   PLT 402 (H) 05/11/2017    BMET    Component Value Date/Time   NA 133 (L) 05/10/2017 0259   K 4.2 05/10/2017 0259   CL 95 (L) 05/10/2017 0259   CO2 24 05/10/2017 0259   GLUCOSE 124 (H) 05/10/2017 0259   BUN 91 (H) 05/10/2017 0259   CREATININE 4.13 (H) 05/10/2017 0259   CALCIUM 9.6 05/10/2017 0259   GFRNONAA 13 (L) 05/10/2017 0259   GFRAA 15 (L) 05/10/2017 0259   Estimated Creatinine Clearance: 19.7 mL/min (A) (by C-G formula based on SCr of 4.13 mg/dL (H)).  COAG Lab Results  Component Value Date   INR 1.18 05/01/2017     Non-Invasive Vascular Imaging: pending vein mapping  ASSESSMENT/PLAN:  CKD stage IV/V GFR 13, Cr 4.13. NSEMI Influenza  He is left hand dominant and the right arm is being saved.  He lives in [place] with his wife who is currently on HD.  He has a Nephrologist that follows him at home.  They are not sure how long he will stay here, or how long he will have to stay here once the Flu and cardiac issues have stabilized.  I discussed that we could go ahead and order the vein mapping and see what our options are, then make a decision on when to proceed with access surgery and where.   He and his daughter agree with this plan.  Vein mapping has been ordered.   Nathan Wolfe 05/11/2017 10:34 AM   I have examined the patient, reviewed and agree with above.  Long discussion with the patient and his daughter present.  Discussed options for hemodialysis to include catheter, AV fistula and AV graft.  He is left-handed so we will save his right arm for access.   They are trying to determine optimal timing for access placement here versus in [place].  We are available for whatever they decide  Nathan Jews, MD 05/11/2017 4:34 PM

## 2017-05-11 NOTE — Progress Notes (Signed)
Patient has a home CPAP and places himself on and off with help of family or RN. Will continue to monitor.

## 2017-05-11 NOTE — Progress Notes (Addendum)
The patient has been seen in conjunction with Kathi Ludwig, MD. All aspects of care have been considered and discussed. The patient has been personally interviewed, examined, and all clinical data has been reviewed.   Complicated clinical situation in this patient with acute on chronic respiratory insufficiency, acute on chronic diastolic heart failure, paroxysmal atrial fibrillation, non-ST elevation MI with inoperable/non-interventional chronic CAD, acute on chronic kidney failure, and hypertension who is being managed medically.  He is visiting [place] from [place].  Continue current therapy for optimal/maximal anti-ischemic effect including amlodipine, Ranolazine, metoprolol, long-acting nitrates.  Also receiving aspirin and Plavix.  Has had PAF while hospitalized, is now on amiodarone, and need to formally address whether or not anticoagulation therapy will be used.  Probably not a good candidate for triple drug therapy.  Will discuss with patient, family, and other team members.  Not starting anticoagulation until we know how the patient's kidney function goes.  Progress Note  Patient Name: Nathan Wolfe Date of Encounter: 05/11/2017  Primary Cardiologist: Quay Burow, MD   Subjective   The patient was sitting up in chair beside his bed upon entering the room today.  He denied chest pain for the past 48 hours and attested to improved dyspnea.  Inpatient Medications    Scheduled Meds: . acetylcysteine  3 mL Nebulization TID  . allopurinol  100 mg Oral Daily  . amiodarone  400 mg Oral Daily  . amLODipine  10 mg Oral Daily  . aspirin EC  81 mg Oral QHS  . brimonidine  1 drop Both Eyes QODAY  . chlorhexidine  15 mL Mouth Rinse BID  . clopidogrel  75 mg Oral Daily  . doxazosin  4 mg Oral Daily  . doxycycline  100 mg Oral Q12H  . enoxaparin (LOVENOX) injection  30 mg Subcutaneous Q24H  . ezetimibe  10 mg Oral Daily  . fenofibrate  160 mg Oral Daily  . FLUoxetine   20 mg Oral Daily  . hydrALAZINE  75 mg Oral BID  . isosorbide mononitrate  120 mg Oral Daily  . mouth rinse  15 mL Mouth Rinse q12n4p  . Melatonin  3 mg Oral QHS  . metoprolol tartrate  50 mg Oral BID  . polyethylene glycol  17 g Oral BID  . ranolazine  1,000 mg Oral BID  . senna-docusate  1 tablet Oral BID  . sodium chloride flush  3 mL Intravenous Q12H   Continuous Infusions: . sodium chloride    . ceFEPime (MAXIPIME) IV Stopped (05/10/17 2050)  . furosemide Stopped (05/11/17 0551)  . nitroGLYCERIN Stopped (05/08/17 1300)   PRN Meds: sodium chloride, acetaminophen, ALPRAZolam, benzonatate, diphenhydrAMINE, gi cocktail, hydrALAZINE, ipratropium-albuterol, nitroGLYCERIN, ondansetron (ZOFRAN) IV, sodium chloride flush   Vital Signs    Vitals:   05/11/17 0934 05/11/17 1000 05/11/17 1100 05/11/17 1149  BP: 135/70     Pulse: (!) 56 63 (!) 57   Resp: (!) 24 (!) 25 (!) 26   Temp:    (!) 97.5 F (36.4 C)  TempSrc:    Oral  SpO2: 100% 98% 94%   Weight:      Height:        Intake/Output Summary (Last 24 hours) at 05/11/2017 1255 Last data filed at 05/11/2017 1000 Gross per 24 hour  Intake 886 ml  Output 550 ml  Net 336 ml   Filed Weights   05/09/17 0500 05/10/17 0500 05/11/17 0459  Weight: 123.5 kg (272 lb 4.3 oz) 122.1 kg (269 lb 3.2  oz) 119 kg (262 lb 4.8 oz)    Telemetry    Sinus bradycardia with possible first-degree AV block- Personally Reviewed  ECG    Sinus bradycardia first-degree AV block PR interval prolonged to 216 ms- Personally Reviewed  Physical Exam   GEN: No acute distress.  Patient is morbidly obese.  Patient is alert and oriented x3 today.  The patient is much more responsive and less lethargic acting.  Is able to answer all questions without significant difficulty Neck:  Significant JVD Cardiac: RRR, no murmurs, rubs, or gallops.  Respiratory: Clear to auscultation bilaterally. GI: Soft, nontender, non-distended  MS:  Symmetric bilateral +1 pedal  and ankle edema; No deformity. Neuro:  Nonfocal  Psych: Normal affect   Labs    Chemistry Recent Labs  Lab 05/05/17 0245  05/09/17 0352 05/10/17 0259 05/11/17 1109  NA 133*   < > 132* 133* 131*  K 4.3   < > 4.4 4.2 4.5  CL 103   < > 94* 95* 93*  CO2 19*   < > 24 24 19*  GLUCOSE 118*   < > 135* 124* 158*  BUN 55*   < > 80* 91* 106*  CREATININE 2.77*   < > 3.81* 4.13* 4.30*  CALCIUM 8.6*   < > 9.4 9.6 9.8  PROT 6.0*  --   --   --   --   ALBUMIN 2.7*  --   --   --   --   AST 36  --   --   --   --   ALT 33  --   --   --   --   ALKPHOS 21*  --   --   --   --   BILITOT 0.9  --   --   --   --   GFRNONAA 21*   < > 14* 13* 12*  GFRAA 25*   < > 17* 15* 14*  ANIONGAP 11   < > 14 14 19*   < > = values in this interval not displayed.     Hematology Recent Labs  Lab 05/09/17 0352 05/10/17 0259 05/11/17 0321  WBC 8.4 7.1 7.0  RBC 3.27* 2.99* 3.12*  HGB 10.7* 9.6* 10.0*  HCT 31.6* 28.5* 29.3*  MCV 96.6 95.3 93.9  MCH 32.7 32.1 32.1  MCHC 33.9 33.7 34.1  RDW 14.3 14.1 14.1  PLT 304 338 402*    Cardiac Enzymes Recent Labs  Lab 05/07/17 2202 05/08/17 0833 05/08/17 1520 05/08/17 1833  TROPONINI 4.49* 3.32* 2.62* 2.50*   No results for input(s): TROPIPOC in the last 168 hours.   BNPNo results for input(s): BNP, PROBNP in the last 168 hours.   DDimer No results for input(s): DDIMER in the last 168 hours.   Radiology    No results found.  Cardiac Studies   Echo3/17/19: Study Conclusions  - Left ventricle: The cavity size was normal. Wall thickness was increased in a pattern of mild LVH. Systolic function was normal. The estimated ejection fraction was in the range of 50% to 55%. Mild hypokinesis of the anteroseptal myocardium. Features are consistent with a pseudonormal left ventricular filling pattern, with concomitant abnormal relaxation and increased filling pressure (grade 2 diastolic dysfunction). Doppler parameters are consistent with  high ventricular filling pressure. - Aortic valve: Transvalvular velocity was within the normal range. There was no stenosis. There was no regurgitation. - Mitral valve: Transvalvular velocity was within the normal range. There was no evidence for  stenosis. There was trivial regurgitation. - Left atrium: The atrium was severely dilated. - Right ventricle: The cavity size was normal. Wall thickness was normal. Systolic function was normal. - Atrial septum: No defect or patent foramen ovale was identified by color flow Doppler. - Tricuspid valve: There was mild regurgitation. - Pulmonary arteries: Systolic pressure was moderately to severely increased. PA peak pressure: 59 mm Hg (S).  Cardiac cath in March 2016 as per chart review from outside facility. Last catheterization was March 2016."His EFwasnormal. He has a patent LIMA to the LAD with an occluded LAD beyond LIMA insertion. He has a patent vein graft to the right status post stenting of the PDA with a 2.5 mm Synergy drug-eluting stent to the vein graft and a diseased ramus branch was with not intervened on."  Patient Profile     72 y.o. male with extensive CAD history and previous redo bypass surgery in 2000, 2011 multiple subsequent drug-eluting stents including SVG to the PDA 2016, with known occlusion of the LAD beyond the LIMA bypass.  Is also severely diseased ramus intermedius branch not amenable to further vascularization as per the patient's primary cardiologist.  He was admitted for chest pain with elevated troponins but no corresponding EKG changes most likely secondary to demand ischemia from influenza pneumonia and heart failure exacerbation with underlying worsening of his chronic kidney disease we will continue IV diuretics due to hypoxic respiratory insufficiency.  Assessment & Plan    CAD status post and STEMI: Continue to believe that a cardiac catheterization is of low benefit with high risk of  nephrotoxicity leading to necessary hemodialysis.  Given the resolution of his chest pain we will continue to monitor this as he proceeds through rehabilitation.  Patient's troponin peaked approximate 4.49.  Atrial fibrillation:  This was a brief episode following significant chest pain and anxiety.  The patient has a notable dilated left atrium asymptomatic with atrial fibrillation.  We will continue loading the p.o. amiodarone at 400 mg daily until 05/12/2017 at which time we will initiate maintenance therapy at 200 mg daily. Given patient's risk triple therapy with aspirin, and Plavix already on board were not convinced that the benefits of an additional anticoagulant outweigh the risks.  Acute on chronic CAD:  Patient continues to diurese well with notable decrease in his weight approximate 260 pounds from 273.  Nephrology following: continuing diuresis with 120 mg IV Lasix every 8 hours BMP daily renal function close observation the patient's electrolytes given the rate of diuresis  Acute respiratory insufficiency with hypoxia: The continues to require significant oxygen via nasal cannula at 4-5 L with intermittent CPAP use.  Is undetermined with the patient's baseline O2 requirements would have been given his attested 1 year history of significant dyspnea with minimal activity.  Congestive heart failure: Patient EF maintained approximately 50-55% on echo during his admission with notable preserved left ventricular systolic function, dilated left atrium probable volume overload.  Patient's weights began to increase following aggressive diuresis.  We will continue to monitor.  Hypertension Patients most recent series of blood pressures are acceptable.  For questions or updates, please contact [facility] Please consult [email or web address] for contact info under Cardiology/STEMI.  Please see attending note/attestation for current assessment and plan.    Signed, Kathi Ludwig, MD    05/11/2017, 12:55 PM   Pager # [phone]

## 2017-05-11 NOTE — Evaluation (Signed)
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 He presents with L sided chest pain at rest not relieved by nitroglycerin, sepsis 2 influenza A.  Clinical Impression  Pt admitted with above diagnosis. Pt currently with functional limitations due to the deficits listed below (see PT Problem List). At the time of PT eval pt was able to perform transfers and ambulation with gross min guard assist to minimal assist for balance support and safety. Pt was educated on pursed-lip breathing and energy conservation techniques as both pt and daughter report a history of becoming dyspneic with minimal activity PTA. Pt will benefit from skilled PT to increase their independence and safety with mobility to allow discharge to the venue listed below.     SATURATION QUALIFICATIONS: (This note is used to comply with regulatory documentation for home oxygen)  Patient Saturations on Room Air at Rest = 87%  Patient Saturations on Room Air while Ambulating = Not tested  Patient Saturations on 4 Liters of oxygen while Ambulating = 89%  Please briefly explain why patient needs home oxygen: Pt is not able to maintain oxygen saturations >87% on room air at rest.     Follow Up Recommendations Home health PT;Supervision/Assistance - 24 hour    Equipment Recommendations  Rolling walker with 5" wheels    Recommendations for Other Services       Precautions / Restrictions Precautions Precautions: Fall Restrictions Weight Bearing Restrictions: No      Mobility  Bed Mobility               General bed mobility comments: Pt sitting up in chair upon PT arrival.   Transfers Overall transfer level: Needs  assistance Equipment used: Rolling walker (2 wheeled) Transfers: Sit to/from Stand Sit to Stand: Min assist         General transfer comment: Assist to power-up to full standing position. Once standing, pt able to gain and maintain standing balance without assistance. Daughter assisted pt to void with urinal in standing without LOB.   Ambulation/Gait Ambulation/Gait assistance: Min guard Ambulation Distance (Feet): 175 Feet Assistive device: Rolling walker (2 wheeled) Gait Pattern/deviations: Step-through pattern;Decreased stride length;Trunk flexed Gait velocity: Decreased Gait velocity interpretation: Below normal speed for age/gender General Gait Details: Mild dyspnea towards end of gait training with several standing rest breaks required with pursed-lip breathing. With RW, pt did not require any assist for balance, however hands-on guarding provided throughout for safety.   Stairs            Wheelchair Mobility    Modified Rankin (Stroke Patients Only)       Balance Overall balance assessment: Needs assistance Sitting-balance support: Feet supported;No upper extremity supported Sitting balance-[name]: Good     Standing balance support: During functional activity;Single extremity supported Standing balance-[name]: Poor Standing balance comment: Requires at least 1 UE support to maintain standing balance.                              Pertinent Vitals/Pain Pain Assessment: No/denies pain    Home Living Family/patient expects to be discharged to:: Private residence Living Arrangements: Spouse/significant other Available Help at Discharge: Family;Available 24  hours/day Type of Home: House(Townhome) Home Access: Level entry     Home Layout: Two level;Able to live on main level with bedroom/bathroom Home Equipment: Shower seat - built in      Prior Function Level of Independence: Independent         Comments: Independent however per  daughter pt very dyspneic with minimal exertion, especially with ADL's.     Hand Dominance        Extremity/Trunk Assessment   Upper Extremity Assessment Upper Extremity Assessment: Defer to OT evaluation    Lower Extremity Assessment Lower Extremity Assessment: Generalized weakness(Fatigues quickly. With MMT LE's grossly 4/5 strength)    Cervical / Trunk Assessment Cervical / Trunk Assessment: Other exceptions Cervical / Trunk Exceptions: Forward head posture with rounded shoulders  Communication      Cognition Arousal/Alertness: Awake/alert Behavior During Therapy: WFL for tasks assessed/performed Overall Cognitive Status: Within Functional Limits for tasks assessed                                 General Comments: Overall WFL. At times appears somewhat off with processing but unsure if he is attempting to use humor to make light of his situation      General Comments General comments (skin integrity, edema, etc.): O2 dropping on RA in static standing to 87%. On 4L/min supplemental O2 sats dropped to 89% during gait training.     Exercises     Assessment/Plan    PT Assessment Patient needs continued PT services  PT Problem List Decreased strength;Decreased range of motion;Decreased activity tolerance;Decreased balance;Decreased mobility;Decreased knowledge of use of DME;Decreased safety awareness;Decreased knowledge of precautions;Cardiopulmonary status limiting activity       PT Treatment Interventions DME instruction;Gait training;Stair training;Functional mobility training;Therapeutic activities;Therapeutic exercise;Neuromuscular re-education;Patient/family education    PT Goals (Current goals can be found in the Care Plan section)  Acute Rehab PT Goals Patient Stated Goal: Home to daughter's house at d/c. 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 : A Little Difficulty sitting down on and standing up from a chair with arms (e.g., wheelchair, bedside commode, etc,.)?: A Little Help needed moving to and from a bed to chair (including a wheelchair)?: A Little Help needed walking in hospital room?: A Little Help needed climbing 3-5 steps with a railing? : A Lot 6 Click Score: 17    End of Session Equipment Utilized During Treatment: Gait belt Activity Tolerance: Patient tolerated treatment well Patient left: in chair;with call bell/phone within reach;with family/visitor present Nurse Communication: Mobility status PT Visit Diagnosis: Unsteadiness on feet (R26.81);Difficulty in walking, not elsewhere classified (R26.2)    Time: 1096-0454 PT Time Calculation (min) (ACUTE ONLY): 34 min   Charges:   PT Evaluation $PT Eval Moderate Complexity: 1 Mod PT Treatments $Gait Training: 8-22 mins   PT G Codes:        Rolinda Roan, PT, DPT Acute Rehabilitation Services Pager: [phone]  Thelma Comp 05/11/2017, 11:31 AM

## 2017-05-11 NOTE — Progress Notes (Signed)
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 Patient reports symptoms did not feel like acid reflux. Reports pain radiating into his jaw similar to previous heart attacks. Patient reports taking approximately 4 nitros with just temporary relief of symptoms before return of pain symptoms. In the ER, pt was noted to be flu positive, flat troponin, admitted for further management.  Today, pt noted with some improvement, no further CP episodes, dyspnea improving, no fever/chills noted.  Worsening renal function, nephrology consulted.  05/11/2017: Patient seen alongside patient's daughter.  Patient tells me that he is breathing a lot better.  Patient is still on high-dose diuretics.  Worsening BUN and creatinine is noted.  BUN is 106 and creatinine is 4.3 today.  We will also check albumin level.  Will discuss need to continue high-dose diuretics with the renal team.  Echocardiogram revealed moderate to severe pulmonary hypertension with PA pressure of 59 mmHg .  Overall, the patient's prognosis is guarded.   Assessment/Plan: Principal Problem:   Anxiety Active Problems:   Unstable angina (HCC)   Anemia due to chronic kidney disease   Essential hypertension   OSA on CPAP   NSTEMI (non-ST elevated myocardial infarction) ([place])   Demand ischemia (HCC)   Influenza A   Acute respiratory failure with hypoxia (HCC)   Pulmonary edema   Paroxysmal atrial fibrillation (HCC)   Acute on chronic diastolic heart failure (HCC)   Acute renal failure superimposed on  stage 4 chronic kidney disease (HCC)   Chest pain   Goals of care, counseling/discussion   Palliative care by specialist  Sepsis 2/2 Influenza A with possible PNA Last temp 102.2 on 3/20, no luekocytosis BC X 2 NGTD, repeat NGTD Procalcitonin trending downwards Sputum cx with few Gram + cocci in clusters, few gram - rods. MRSA PCR negative CXR: with stable pulm edema Vs PNA Completed tamiflu Continue cefepime, doxycycline due to ?influenza PNA, although MRSA PCR neg 05/10/2017: We will proceed with CT scan of the chest without contrast when patient is able to lay down flat.  If CT scan of the chest is negative for pneumonia, will de-escalate patient's antibiotics.  Unstable angina/NSTEMI No further chest pain Extensive cardiac hx as mentioned above Trop peaked at 4.49, now down trending, EKG with t wave changes  ECHO with EF of 50-55%, Grade 2 DD, no RWMA Cardiology on board: added ranexa 500 mg BID. Still a poor candidate for cardiac cath due to extensive prior occlusions, with a low chance of successful revascularization, marginal resp status, worsening CKD Continue ASA, plavix, imdur, metoprolol, NTG prn Monitor closely  Acute on chronic diastolic HF Repeat CXR with stable pulm edema ECHO as above Aggressive diuresis. BUN and creatinine continue to rise.  Will discuss need to continue high-dose diuretics with the nephrology team.  AKI on CKD stage IV Creatinine of 3.4 on admission-->currently rising Follows with nephrology in [place], not on HD Nephrology consulted 04/18/2017-worsening BUN and creatinine noted.   P.Afib Currently HR controlled Stopped IV amiodarone, switched to PO amiodarone Cardiology on board, contemplating [facility], ?possible NOAC on discharge  Essential hypertension Stable Continue metoprolol (50 BID), hydralazine (increased to 75 BID), amlodipine (  increased to 10), imdur, lasix  Anemia of chronic disease Stable, monitor closely  BPH Continue  Cardura  Dyslipidemia Continue fenofibrate, started zetia  OSA on CPAP CPAP at night  Anxiety Continue xanax prn Psych consulted, no further recs  GOC Palliative consulted, continue full scope of treatment/management except heart surgery     Code Status: Full  Family Communication: Daughter at bedside  Disposition Plan: Once work-up complete.  Consultants:  Cardiology  Nephrology  Procedures:  None  Antimicrobials:  Cefepime  Doxcycline   Tamiflu   DVT prophylaxis:  Lovenox   Objective: Vitals:   05/11/17 0934 05/11/17 1000 05/11/17 1100 05/11/17 1149  BP: 135/70     Pulse: (!) 56 63 (!) 57   Resp: (!) 24 (!) 25 (!) 26   Temp:    (!) 97.5 F (36.4 C)  TempSrc:    Oral  SpO2: 100% 98% 94%   Weight:      Height:        Intake/Output Summary (Last 24 hours) at 05/11/2017 1317 Last data filed at 05/11/2017 1000 Gross per 24 hour  Intake 886 ml  Output 500 ml  Net 386 ml   Filed Weights   05/09/17 0500 05/10/17 0500 05/11/17 0459  Weight: 123.5 kg (272 lb 4.3 oz) 122.1 kg (269 lb 3.2 oz) 119 kg (262 lb 4.8 oz)    Exam:   General:  NAD   Cardiovascular: S1, S2 present  Respiratory: Mild crackles noted b/l at the bases  Abdomen: Soft, NT, ND, BS present  Musculoskeletal: No pedal edema  Skin: Normal  Psychiatry: Normal mood   Data Reviewed: CBC: Recent Labs  Lab 05/07/17 0304 05/08/17 0259 05/09/17 0352 05/10/17 0259 05/11/17 0321  WBC 8.5 6.1 8.4 7.1 7.0  NEUTROABS  --  3.8 6.0 4.9 4.6  HGB 9.8* 9.8* 10.7* 9.6* 10.0*  HCT 29.3* 29.2* 31.6* 28.5* 29.3*  MCV 96.1 96.1 96.6 95.3 93.9  PLT 212 228 304 338 034*   Basic Metabolic Panel: Recent Labs  Lab 05/05/17 0245  05/07/17 0304 05/08/17 0259 05/09/17 0352 05/10/17 0259 05/11/17 1109  NA 133*   < > 134* 135 132* 133* 131*  K 4.3   < > 4.2 4.5 4.4 4.2 4.5  CL 103   < > 100* 99* 94* 95* 93*  CO2 19*   < > 22 21* 24 24 19*  GLUCOSE 118*   < > 196* 127* 135*  124* 158*  BUN 55*   < > 63* 72* 80* 91* 106*  CREATININE 2.77*   < > 2.94* 3.22* 3.81* 4.13* 4.30*  CALCIUM 8.6*   < > 8.9 9.2 9.4 9.6 9.8  MG 2.4  --   --   --   --   --   --    < > = values in this interval not displayed.   GFR: Estimated Creatinine Clearance: 18.9 mL/min (A) (by C-G formula based on SCr of 4.3 mg/dL (H)). Liver Function Tests: Recent Labs  Lab 05/05/17 0245  AST 36  ALT 33  ALKPHOS 21*  BILITOT 0.9  PROT 6.0*  ALBUMIN 2.7*   No results for input(s): LIPASE, AMYLASE in the last 168 hours. No results for input(s): AMMONIA in the last 168 hours. Coagulation Profile: No results for input(s): INR, PROTIME in the last 168 hours. Cardiac Enzymes: Recent Labs  Lab 05/07/17 1542 05/07/17 2202 05/08/17 0833 05/08/17 1520 05/08/17 1833  TROPONINI 4.22* 4.49* 3.32* 2.62* 2.50*   BNP (last 3  results) No results for input(s): PROBNP in the last 8760 hours. HbA1C: No results for input(s): HGBA1C in the last 72 hours. CBG: No results for input(s): GLUCAP in the last 168 hours. Lipid Profile: No results for input(s): CHOL, HDL, LDLCALC, TRIG, CHOLHDL, LDLDIRECT in the last 72 hours. Thyroid Function Tests: No results for input(s): TSH, T4TOTAL, FREET4, T3FREE, THYROIDAB in the last 72 hours. Anemia Panel: No results for input(s): VITAMINB12, FOLATE, FERRITIN, TIBC, IRON, RETICCTPCT in the last 72 hours. Urine analysis: No results found for: COLORURINE, APPEARANCEUR, LABSPEC, PHURINE, GLUCOSEU, HGBUR, BILIRUBINUR, KETONESUR, PROTEINUR, UROBILINOGEN, NITRITE, LEUKOCYTESUR Sepsis Labs: @LABRCNTIP (procalcitonin:4,lacticidven:4)  ) Recent Results (from the past 240 hour(s))  Culture, blood (routine x 2)     Status: None   Collection Time: 05/02/17  1:06 AM  Result Value Ref Range Status   Specimen Description BLOOD RIGHT HAND  Final   Special Requests   Final    BOTTLES DRAWN AEROBIC AND ANAEROBIC Blood Culture results may not be optimal due to an inadequate  volume of blood received in culture bottles   Culture   Final    NO GROWTH 5 DAYS Performed at [facility], [place] [address] [place], [place] [address]    Report Status 05/07/2017 FINAL  Final  Culture, blood (routine x 2)     Status: None   Collection Time: 05/02/17  1:07 AM  Result Value Ref Range Status   Specimen Description BLOOD RIGHT HAND  Final   Special Requests   Final    BOTTLES DRAWN AEROBIC AND ANAEROBIC Blood Culture results may not be optimal due to an inadequate volume of blood received in culture bottles   Culture   Final    NO GROWTH 5 DAYS Performed at [facility], [place] [address] [place], [place] [address]    Report Status 05/07/2017 FINAL  Final  MRSA PCR Screening     Status: None   Collection Time: 05/02/17  9:45 AM  Result Value Ref Range Status   MRSA by PCR NEGATIVE NEGATIVE Final    Comment:        The GeneXpert MRSA Assay (FDA approved for NASAL specimens only), is one component of a comprehensive MRSA colonization surveillance program. It is not intended to diagnose MRSA infection nor to guide or monitor treatment for MRSA infections. Performed at [facility], [place] [address], [place], [place] [address]   MRSA PCR Screening     Status: None   Collection Time: 05/04/17  5:15 PM  Result Value Ref Range Status   MRSA by PCR NEGATIVE NEGATIVE Final    Comment:        The GeneXpert MRSA Assay (FDA approved for NASAL specimens only), is one component of a comprehensive MRSA colonization surveillance program. It is not intended to diagnose MRSA infection nor to guide or monitor treatment for MRSA infections. Performed at [facility], [place] [address], [place], [place] [address]   Culture, expectorated sputum-assessment     Status: None   Collection Time: 05/04/17  6:06 PM  Result Value Ref Range Status   Specimen Description EXPECTORATED SPUTUM  Final   Special Requests NONE  Final   Sputum evaluation   Final    THIS  SPECIMEN IS ACCEPTABLE FOR SPUTUM CULTURE Performed at [facility], [place] [address], [place], [place] [address]    Report Status 05/04/2017 FINAL  Final  Culture, respiratory (NON-Expectorated)     Status: None  Collection Time: 05/04/17  6:06 PM  Result Value Ref Range Status   Specimen Description EXPECTORATED SPUTUM  Final   Special Requests NONE Reflexed from [id number]  Final   Gram Stain   Final    FEW WBC PRESENT, PREDOMINANTLY PMN FEW SQUAMOUS EPITHELIAL CELLS PRESENT FEW GRAM POSITIVE COCCI IN CLUSTERS FEW GRAM NEGATIVE RODS RARE GRAM POSITIVE RODS    Culture   Final    Consistent with normal respiratory flora. Performed at [facility], [place] [address] [place], [place] [address]    Report Status 05/07/2017 FINAL  Final  Culture, blood (routine x 2) Call MD if unable to obtain prior to antibiotics being given     Status: None   Collection Time: 05/04/17  8:30 PM  Result Value Ref Range Status   Specimen Description BLOOD RIGHT HAND  Final   Special Requests Blood Culture adequate volume BLOOD AEROBIC BOTTLE  Final   Culture   Final    NO GROWTH 5 DAYS Performed at [facility], [place] [address] [place], [place] [address]    Report Status 05/09/2017 FINAL  Final  Culture, blood (routine x 2) Call MD if unable to obtain prior to antibiotics being given     Status: None   Collection Time: 05/04/17  8:35 PM  Result Value Ref Range Status   Specimen Description BLOOD RIGHT HAND  Final   Special Requests BLOOD AEROBIC BOTTLE Blood Culture adequate volume  Final   Culture   Final    NO GROWTH 5 DAYS Performed at [facility], [place] [address] [place], [place] [address]    Report Status 05/09/2017 FINAL  Final  Culture, blood (routine x 2)     Status: None (Preliminary result)   Collection Time: 05/07/17  4:38 PM  Result Value Ref Range Status   Specimen Description BLOOD RIGHT HAND  Final   Special Requests   Final    BOTTLES DRAWN AEROBIC ONLY Blood  Culture adequate volume   Culture   Final    NO GROWTH 4 DAYS Performed at [facility], [place] [address], [place], [place] [address]    Report Status PENDING  Incomplete  Culture, blood (routine x 2)     Status: None (Preliminary result)   Collection Time: 05/07/17  4:43 PM  Result Value Ref Range Status   Specimen Description BLOOD LEFT HAND  Final   Special Requests   Final    BOTTLES DRAWN AEROBIC ONLY Blood Culture adequate volume   Culture   Final    NO GROWTH 4 DAYS Performed at [name], [place] [address], [place], [place] [address]    Report Status PENDING  Incomplete      Studies: No results found.  Scheduled Meds: . acetylcysteine  3 mL Nebulization TID  . allopurinol  100 mg Oral Daily  . amiodarone  400 mg Oral Daily  . amLODipine  10 mg Oral Daily  . aspirin EC  81 mg Oral QHS  . brimonidine  1 drop Both Eyes QODAY  . chlorhexidine  15 mL Mouth Rinse BID  . clopidogrel  75 mg Oral Daily  . doxazosin  4 mg Oral Daily  . doxycycline  100 mg Oral Q12H  . enoxaparin (LOVENOX) injection  30 mg Subcutaneous Q24H  . ezetimibe  10 mg Oral Daily  . fenofibrate  160 mg Oral Daily  . FLUoxetine  20 mg Oral Daily  . hydrALAZINE  75 mg Oral BID  . isosorbide mononitrate  120 mg Oral Daily  . mouth rinse  15 mL Mouth Rinse q12n4p  . Melatonin  3 mg Oral QHS  . metoprolol tartrate  50 mg Oral BID  . polyethylene glycol  17 g Oral BID  . ranolazine  1,000 mg Oral BID  . senna-docusate  1 tablet Oral BID  . sodium chloride flush  3 mL Intravenous Q12H    Continuous Infusions: . sodium chloride    . ceFEPime (MAXIPIME) IV Stopped (05/10/17 2050)  . furosemide Stopped (05/11/17 0551)  . nitroGLYCERIN Stopped (05/08/17 1300)     LOS: 10 days     Bonnell Public, MD [facility]   If 7PM-7AM, please contact night-coverage [email or web address] Password [email or web address] 05/11/2017, 1:17 PM

## 2017-05-11 NOTE — Progress Notes (Signed)
[facility] ROUNDING NOTE   Subjective:   72 y.o. year-old with hx of CAD hx CABG 2000 and redo 2011, sp L CEA, CKD stage IV,obesity , OSA on CPAP admitted on 05/01/17 with c/o L sided chest pain.  Was admitted to SDU for unstable angina.  Seen by cardiology, hx complex anatomy, CABG and redo CABG, they recommended medical Rx w IV NTG and heparin.  Trop peaked at 4.49  EF of 50-55%, Grade 2 DD, no RWMA   Creat was 2.8 on admission and is now up to 4.1   Great diuresis with a weight that has decreased to 262 lbs from 273  IV lasix 120mg  q 8hours  He developed flu and pneumonia and is being treated with antibiotics - cefepime and doxyclycline  kidney doctor in [facility], he is up visting family here with his wife.  His wife is a dialysis patient. He is interested in seeing the vascular surgeons for placement of access         Objective:  Vital signs in last 24 hours:  Temp:  [96.5 F (35.8 C)-98.5 F (36.9 C)] 97.6 F (36.4 C) (03/25 0400) Pulse Rate:  [47-99] 63 (03/25 1000) Resp:  [13-29] 25 (03/25 1000) BP: (99-146)/(51-81) 135/70 (03/25 0934) SpO2:  [85 %-100 %] 98 % (03/25 1000) Weight:  [262 lb 4.8 oz (119 kg)] 262 lb 4.8 oz (119 kg) (03/25 0459)  Weight change: -6 lb 14.4 oz (-3.13 kg) Filed Weights   05/09/17 0500 05/10/17 0500 05/11/17 0459  Weight: 272 lb 4.3 oz (123.5 kg) 269 lb 3.2 oz (122.1 kg) 262 lb 4.8 oz (119 kg)    Intake/Output: I/O last 3 completed shifts: In: 57 [P.O.:320; IV Piggyback:510] Out: 0488 [Urine:1730]   Intake/Output this shift:  Total I/O In: 400 [P.O.:400] Out: -   CVS- RRR RS- CTA ABD- BS present soft non-distended EXT- no edema   Basic Metabolic Panel: Recent Labs  Lab 05/05/17 0245 05/06/17 0304 05/07/17 0304 05/08/17 0259 05/09/17 0352 05/10/17 0259  NA 133* 133* 134* 135 132* 133*  K 4.3 4.2 4.2 4.5 4.4 4.2  CL 103 101 100* 99* 94* 95*  CO2 19* 22 22 21* 24 24  GLUCOSE 118* 112* 196* 127* 135* 124*  BUN  55* 55* 63* 72* 80* 91*  CREATININE 2.77* 2.85* 2.94* 3.22* 3.81* 4.13*  CALCIUM 8.6* 8.6* 8.9 9.2 9.4 9.6  MG 2.4  --   --   --   --   --     Liver Function Tests: Recent Labs  Lab 05/05/17 0245  AST 36  ALT 33  ALKPHOS 21*  BILITOT 0.9  PROT 6.0*  ALBUMIN 2.7*   No results for input(s): LIPASE, AMYLASE in the last 168 hours. No results for input(s): AMMONIA in the last 168 hours.  CBC: Recent Labs  Lab 05/07/17 0304 05/08/17 0259 05/09/17 0352 05/10/17 0259 05/11/17 0321  WBC 8.5 6.1 8.4 7.1 7.0  NEUTROABS  --  3.8 6.0 4.9 4.6  HGB 9.8* 9.8* 10.7* 9.6* 10.0*  HCT 29.3* 29.2* 31.6* 28.5* 29.3*  MCV 96.1 96.1 96.6 95.3 93.9  PLT 212 228 304 338 402*    Cardiac Enzymes: Recent Labs  Lab 05/07/17 1542 05/07/17 2202 05/08/17 0833 05/08/17 1520 05/08/17 1833  TROPONINI 4.22* 4.49* 3.32* 2.62* 2.50*    BNP: Invalid input(s): POCBNP  CBG: No results for input(s): GLUCAP in the last 168 hours.  Microbiology: Results for orders placed or performed during the hospital encounter of  04/30/17  Culture, blood (routine x 2)     Status: None   Collection Time: 05/02/17  1:06 AM  Result Value Ref Range Status   Specimen Description BLOOD RIGHT HAND  Final   Special Requests   Final    BOTTLES DRAWN AEROBIC AND ANAEROBIC Blood Culture results may not be optimal due to an inadequate volume of blood received in culture bottles   Culture   Final    NO GROWTH 5 DAYS Performed at [facility], [place] [address] [place], [place] [address]    Report Status 05/07/2017 FINAL  Final  Culture, blood (routine x 2)     Status: None   Collection Time: 05/02/17  1:07 AM  Result Value Ref Range Status   Specimen Description BLOOD RIGHT HAND  Final   Special Requests   Final    BOTTLES DRAWN AEROBIC AND ANAEROBIC Blood Culture results may not be optimal due to an inadequate volume of blood received in culture bottles   Culture   Final    NO GROWTH 5 DAYS Performed at [facility], [place] [address] [place], [place] [address]    Report Status 05/07/2017 FINAL  Final  MRSA PCR Screening     Status: None   Collection Time: 05/02/17  9:45 AM  Result Value Ref Range Status   MRSA by PCR NEGATIVE NEGATIVE Final    Comment:        The GeneXpert MRSA Assay (FDA approved for NASAL specimens only), is one component of a comprehensive MRSA colonization surveillance program. It is not intended to diagnose MRSA infection nor to guide or monitor treatment for MRSA infections. Performed at [facility], [place] [address] [place], [place] [address]   MRSA PCR Screening     Status: None   Collection Time: 05/04/17  5:15 PM  Result Value Ref Range Status   MRSA by PCR NEGATIVE NEGATIVE Final    Comment:        The GeneXpert MRSA Assay (FDA approved for NASAL specimens only), is one component of a comprehensive MRSA colonization surveillance program. It is not intended to diagnose MRSA infection nor to guide or monitor treatment for MRSA infections. Performed at [facility], [place] [address], [place], [place] [address]   Culture, expectorated sputum-assessment     Status: None   Collection Time: 05/04/17  6:06 PM  Result Value Ref Range Status   Specimen Description EXPECTORATED SPUTUM  Final   Special Requests NONE  Final   Sputum evaluation   Final    THIS SPECIMEN IS ACCEPTABLE FOR SPUTUM CULTURE Performed at [facility], [place] [address], [place], [place] [address]    Report Status 05/04/2017 FINAL  Final  Culture, respiratory (NON-Expectorated)     Status: None   Collection Time: 05/04/17  6:06 PM  Result Value Ref Range Status   Specimen Description EXPECTORATED SPUTUM  Final   Special Requests NONE Reflexed from [id number]  Final   Gram Stain   Final    FEW WBC PRESENT, PREDOMINANTLY PMN FEW SQUAMOUS EPITHELIAL CELLS PRESENT FEW GRAM POSITIVE COCCI IN CLUSTERS FEW GRAM NEGATIVE RODS RARE GRAM POSITIVE RODS    Culture   Final     Consistent with normal respiratory flora. Performed at [facility], [place] [address] [place], [place] [address]    Report Status 05/07/2017 FINAL  Final  Culture, blood (routine x 2) Call MD if unable to obtain  prior to antibiotics being given     Status: None   Collection Time: 05/04/17  8:30 PM  Result Value Ref Range Status   Specimen Description BLOOD RIGHT HAND  Final   Special Requests Blood Culture adequate volume BLOOD AEROBIC BOTTLE  Final   Culture   Final    NO GROWTH 5 DAYS Performed at [facility], [place] [address], [place], [place] [address]    Report Status 05/09/2017 FINAL  Final  Culture, blood (routine x 2) Call MD if unable to obtain prior to antibiotics being given     Status: None   Collection Time: 05/04/17  8:35 PM  Result Value Ref Range Status   Specimen Description BLOOD RIGHT HAND  Final   Special Requests BLOOD AEROBIC BOTTLE Blood Culture adequate volume  Final   Culture   Final    NO GROWTH 5 DAYS Performed at [facility], [place] [address] [place], [place] [address]    Report Status 05/09/2017 FINAL  Final  Culture, blood (routine x 2)     Status: None (Preliminary result)   Collection Time: 05/07/17  4:38 PM  Result Value Ref Range Status   Specimen Description BLOOD RIGHT HAND  Final   Special Requests   Final    BOTTLES DRAWN AEROBIC ONLY Blood Culture adequate volume   Culture   Final    NO GROWTH 3 DAYS Performed at [facility], [place] [address] [place], [place] [address]    Report Status PENDING  Incomplete  Culture, blood (routine x 2)     Status: None (Preliminary result)   Collection Time: 05/07/17  4:43 PM  Result Value Ref Range Status   Specimen Description BLOOD LEFT HAND  Final   Special Requests   Final    BOTTLES DRAWN AEROBIC ONLY Blood Culture adequate volume   Culture   Final    NO GROWTH 3 DAYS Performed at [facility], [place] [address] [place], [place] [address]    Report Status PENDING   Incomplete    Coagulation Studies: No results for input(s): LABPROT, INR in the last 72 hours.  Urinalysis: No results for input(s): COLORURINE, LABSPEC, PHURINE, GLUCOSEU, HGBUR, BILIRUBINUR, KETONESUR, PROTEINUR, UROBILINOGEN, NITRITE, LEUKOCYTESUR in the last 72 hours.  Invalid input(s): APPERANCEUR    Imaging: No results found.   Medications:   . sodium chloride    . ceFEPime (MAXIPIME) IV Stopped (05/10/17 2050)  . furosemide Stopped (05/11/17 0551)  . nitroGLYCERIN Stopped (05/08/17 1300)   . acetylcysteine  3 mL Nebulization TID  . allopurinol  100 mg Oral Daily  . amiodarone  400 mg Oral Daily  . amLODipine  10 mg Oral Daily  . aspirin EC  81 mg Oral QHS  . brimonidine  1 drop Both Eyes QODAY  . chlorhexidine  15 mL Mouth Rinse BID  . clopidogrel  75 mg Oral Daily  . doxazosin  4 mg Oral Daily  . doxycycline  100 mg Oral Q12H  . enoxaparin (LOVENOX) injection  30 mg Subcutaneous Q24H  . ezetimibe  10 mg Oral Daily  . fenofibrate  160 mg Oral Daily  . FLUoxetine  20 mg Oral Daily  . hydrALAZINE  75 mg Oral BID  . isosorbide mononitrate  120 mg Oral Daily  . mouth rinse  15 mL Mouth Rinse q12n4p  . Melatonin  3 mg Oral QHS  . metoprolol tartrate  50 mg Oral BID  . polyethylene glycol  17 g Oral BID  . ranolazine  1,000 mg Oral BID  . senna-docusate  1 tablet Oral BID  . sodium chloride flush  3 mL Intravenous Q12H   sodium chloride, acetaminophen, ALPRAZolam, benzonatate, diphenhydrAMINE, gi cocktail, hydrALAZINE, ipratropium-albuterol, nitroGLYCERIN, ondansetron (ZOFRAN) IV, sodium chloride flush  Assessment/ Plan:  Impression: 1 Acute on CKD IV - suspect due to decomp CHF. Looks better today, getting OOB.  Seems to be diuresing now but not collecting all UOP per dtr. Will cont high dose IV lasix, suspect renal function will level off soon. No indication for RRT. Patient would like to see [facility] and discuss access options   Will check renal ultrasound 2 CKD  stage IV - baseline creat 2.8 - 3.5 per family, f/b nephrology in [facility]. Get UA 3 Chest pain / unstable angina - on admit 3/15, better now 4 HTN - on 4 bp meds here as at home, may need to reduce if pt starts to diurese 5 CAD hx CABG 2000/ redo CABG 2011 - EF 50-55 % by echo here 6 HL 7. Anemia  Hemoglobin 10  Check iron stores      LOS: 10 Macrae Wiegman W @TODAY @10 :50 AM

## 2017-05-12 ENCOUNTER — Inpatient Hospital Stay (HOSPITAL_COMMUNITY): Payer: Medicare Other

## 2017-05-12 DIAGNOSIS — Z01810 Encounter for preprocedural cardiovascular examination: Secondary | ICD-10-CM

## 2017-05-12 LAB — BASIC METABOLIC PANEL
ANION GAP: 14 (ref 5–15)
BUN: 107 mg/dL — ABNORMAL HIGH (ref 6–20)
CO2: 27 mmol/L (ref 22–32)
Calcium: 9.5 mg/dL (ref 8.9–10.3)
Chloride: 92 mmol/L — ABNORMAL LOW (ref 101–111)
Creatinine, Ser: 4.36 mg/dL — ABNORMAL HIGH (ref 0.61–1.24)
GFR calc Af Amer: 14 mL/min — ABNORMAL LOW (ref >60)
GFR, EST NON AFRICAN AMERICAN: 12 mL/min — AB (ref >60)
GLUCOSE: 137 mg/dL — AB (ref 65–99)
POTASSIUM: 3.7 mmol/L (ref 3.5–5.1)
Sodium: 133 mmol/L — ABNORMAL LOW (ref 135–145)

## 2017-05-12 LAB — CBC WITH DIFFERENTIAL/PLATELET
Basophils Absolute: 0 10*3/uL (ref 0.0–0.1)
Basophils Relative: 0 %
EOS PCT: 7 %
Eosinophils Absolute: 0.4 10*3/uL (ref 0.0–0.7)
HEMATOCRIT: 28.4 % — AB (ref 39.0–52.0)
Hemoglobin: 9.6 g/dL — ABNORMAL LOW (ref 13.0–17.0)
LYMPHS ABS: 0.9 10*3/uL (ref 0.7–4.0)
LYMPHS PCT: 13 %
MCH: 31.7 pg (ref 26.0–34.0)
MCHC: 33.8 g/dL (ref 30.0–36.0)
MCV: 93.7 fL (ref 78.0–100.0)
MONO ABS: 0.8 10*3/uL (ref 0.1–1.0)
Monocytes Relative: 12 %
Neutro Abs: 4.5 10*3/uL (ref 1.7–7.7)
Neutrophils Relative %: 68 %
PLATELETS: 430 10*3/uL — AB (ref 150–400)
RBC: 3.03 MIL/uL — ABNORMAL LOW (ref 4.22–5.81)
RDW: 13.9 % (ref 11.5–15.5)
WBC: 6.6 10*3/uL (ref 4.0–10.5)

## 2017-05-12 LAB — CULTURE, BLOOD (ROUTINE X 2)
Culture: NO GROWTH
Culture: NO GROWTH
SPECIAL REQUESTS: ADEQUATE
Special Requests: ADEQUATE

## 2017-05-12 MED ORDER — AMIODARONE HCL 200 MG PO TABS
200.0000 mg | ORAL_TABLET | Freq: Every day | ORAL | Status: DC
  Administered 2017-05-13 – 2017-05-18 (×6): 200 mg via ORAL
  Filled 2017-05-12 (×6): qty 1

## 2017-05-12 MED ORDER — METOPROLOL TARTRATE 25 MG PO TABS
25.0000 mg | ORAL_TABLET | Freq: Two times a day (BID) | ORAL | Status: DC
  Administered 2017-05-12 – 2017-05-18 (×12): 25 mg via ORAL
  Filled 2017-05-12 (×12): qty 1

## 2017-05-12 NOTE — Progress Notes (Addendum)
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 He presents with L sided chest pain at rest not relieved by nitroglycerin, sepsis 2 influenza A.   Clinical Impression   PTA, pt lived in [place] with his wife and was modified independent with ADL and mobility. Pt states he had difficulty with increased WOB for the last year. 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 Pt ambulated @ 150 ft on 4L @ RW level with O2 sats 88-94; HR @ 60. Requires mod A with LB ADL. Pt appears anxious at times during session. Daughter present throughout session. Educated pt on pursed lip breathing and began education about activity modification/use of DME and AE to maximize functional level of independence. Will follow acutely.   Bed mobility on RA 87 therefore ambulated on O2 Ambulating on 4L 88 - 94 After ambulation sitting EOB 96 RA Dyspnea 2/4 during ambulation    Follow Up Recommendations  Home health OT;Supervision/Assistance - 24 hour    Equipment Recommendations  3 in 1 bedside commode;Other (comment)(rollator)    Recommendations for Other Services       Precautions / Restrictions Precautions Precautions: Fall Precaution Comments: watch O2 Sats Restrictions Weight Bearing Restrictions: No      Mobility Bed Mobility Overal bed mobility: Needs Assistance Bed Mobility: Supine to Sit     Supine to sit: Min assist     General bed mobility comments: Daughter assisting pt up to EOB  Transfers Overall transfer level: Needs assistance Equipment used: Rolling walker (2 wheeled) Transfers: Sit to/from Stand Sit to Stand: Min guard         General transfer comment: vc for  hand placement    Balance Overall balance assessment: Needs assistance Sitting-balance support: Feet supported;No upper extremity supported Sitting balance-[name]: Good     Standing balance support: During functional activity;Single extremity supported Standing balance-[name]: Fair Standing balance comment: Requires at least 1 UE support to maintain standing balance.                            ADL either performed or assessed with clinical judgement   ADL Overall ADL's : Needs assistance/impaired     Grooming: Set up;Sitting   Upper Body Bathing: Set up;Sitting   Lower Body Bathing: Moderate assistance;Sit to/from stand   Upper Body Dressing : Set up;Supervision/safety;Sitting   Lower Body Dressing: Moderate assistance;Sit to/from stand   Toilet Transfer: Min guard;Ambulation;RW   Toileting- Clothing Manipulation and Hygiene: Minimal assistance Toileting - Clothing Manipulation Details (indicate cue type and reason): daughter assisting with holing urinal     Functional mobility during ADLs: Min guard;Rolling walker General ADL Comments: SOB with activity; states at baseline he is able to dress himself but it feels like he "ran a race" after wards. Began educating on use/availability of AE toA with ADL     Vision Baseline Vision/History: Wears glasses       Perception     Praxis      Pertinent Vitals/Pain Pain Assessment: No/denies pain     Hand Dominance Left   Extremity/Trunk Assessment Upper Extremity Assessment Upper Extremity Assessment: Generalized weakness   Lower  Extremity Assessment Lower Extremity Assessment: Generalized weakness   Cervical / Trunk Assessment Cervical / Trunk Assessment: Other exceptions Cervical / Trunk Exceptions: Forward head posture with rounded shoulders   Communication Communication Communication: No difficulties   Cognition Arousal/Alertness: Awake/alert Behavior During Therapy: WFL for tasks  assessed/performed Overall Cognitive Status: Within Functional Limits for tasks assessed(hx of depression/anxiety per daughter)                                 General Comments: Overall WFL. At times appears somewhat off with processing but unsure if he is attempting to use humor to make light of his situation   General Comments       Exercises     Shoulder Instructions      Home Living Family/patient expects to be discharged to:: Private residence Living Arrangements: Spouse/significant other Available Help at Discharge: Family;Available 24 hours/day Type of Home: House(Townhome) Home Access: Level entry     Home Layout: Two level;Able to live on main level with bedroom/bathroom     Bathroom Shower/Tub: Occupational psychologist: Standard Bathroom Accessibility: Yes   Home Equipment: Shower seat - built in   Additional Comments: Plan is to DC to daughter's house until he is able to travel home. level entry; 1 level; walk in ; bathroom RW accessible      Prior Functioning/Environment Level of Independence: Independent        Comments: sport pilot; drives motorcycles        OT Problem List: Decreased strength;Decreased activity tolerance;Impaired balance (sitting and/or standing);Decreased safety awareness;Decreased knowledge of use of DME or AE;Cardiopulmonary status limiting activity;Obesity      OT Treatment/Interventions: Self-care/ADL training;Therapeutic exercise;Energy conservation;DME and/or AE instruction;Therapeutic activities;Cognitive remediation/compensation;Patient/family education;Balance training    OT Goals(Current goals can be found in the care plan section) Acute Rehab OT Goals Patient Stated Goal: Home to daughter's house at d/c. Hopeful to get back to [facility] and flying his plane again OT Goal Formulation: With patient Time For Goal Achievement: 05/26/17 Potential to Achieve Goals: Good  OT Frequency: Min 2X/week   Barriers  to D/C:            Co-evaluation              AM-PAC PT "6 Clicks" Daily Activity     Outcome Measure Help from another person eating meals?: None Help from another person taking care of personal grooming?: A Little Help from another person toileting, which includes using toliet, bedpan, or urinal?: A Little Help from another person bathing (including washing, rinsing, drying)?: A Little Help from another person to put on and taking off regular upper body clothing?: A Little Help from another person to put on and taking off regular lower body clothing?: A Little 6 Click Score: 19   End of Session Equipment Utilized During Treatment: Gait belt;Rolling walker;Oxygen(walked on 4L) Nurse Communication: Mobility status  Activity Tolerance: Patient tolerated treatment well Patient left: in bed;with call bell/phone within reach;with family/visitor present  OT Visit Diagnosis: Unsteadiness on feet (R26.81);Muscle weakness (generalized) (M62.81);Other symptoms and signs involving cognitive function                Time: 1315-1352 OT Time Calculation (min): 37 min Charges:  OT General Charges $OT Visit: 1 Visit OT Evaluation $OT Eval Moderate Complexity: 1 Mod OT Treatments $Self Care/Home Management : 8-22 mins G-Codes:     Maurie Boettcher, OT/L  [phone]  05/12/2017  Russell Quinney,HILLARY 05/12/2017, 2:08 PM

## 2017-05-12 NOTE — Progress Notes (Signed)
Cardiology Progress Note   Please see the full note by Dr. Berline Lopes.  Severe diffuse coronary disease with native and bypass graft occlusion.  Plan is aggressive medical therapy to control anginal complaints.  This includes amlodipine, Reneau losing, metoprolol, long-acting nitrates.  He has developed some bradycardia as loading with amiodarone is contributing.  Decrease metoprolol to 25 mg p.o. twice daily.  Amiodarone is being used to suppress recurrent atrial fibrillation.  As long as we maintain sinus rhythm, have decided not to start anticoagulant therapy in the setting of dual antiplatelet therapy.  If recurrent atrial fibrillation on amiodarone will need aspirin discontinued and Plavix/anticoagulant long-term.  Atrial fibrillation was likely related to respiratory failure.  Begin ambulation.  Okay to transfer if ambulates well.

## 2017-05-12 NOTE — Progress Notes (Addendum)
Upper Extremity Vein Map  Right Cephalic  Segment Diameter Depth Comment  1. Axilla 4.65mm 16.79mm   2. Mid upper arm 5.43mm 9.65mm   3. Above AC 6.37mm 5.27mm   4. In [facility] 5.91mm 2.75mm   5. Below AC 4.57mm 4.25mm Branch  6. Mid forearm 2.43mm 3.34mm   7. Wrist 2.64mm 3.32mm                   Right Basilic Not visualized   Left Cephalic  Segment Diameter Depth Comment  1. Axilla 5.25mm 17.17mm   2. Mid upper arm 4.41mm 11.37mm   3. Above [facility] 5.36mm 5.8mm   4. In [facility] 6.74mm 3.58mm Thrombus surrounding IV  5. Below AC 3.57mm 4.55mm   6. Mid forearm 3.22mm 4.46mm   7. Wrist 1.74mm 2.22mm                   Left Basilic Not visualized  9/72/8206 4:39 PM Maudry Mayhew, BS, RVT, RDCS, RDMS

## 2017-05-12 NOTE — Progress Notes (Addendum)
The patient has been seen in conjunction with Kathi Ludwig, MD. All aspects of care have been considered and discussed. The patient has been personally interviewed, examined, and all clinical data has been reviewed.   Refer to my note from earlier in the day.  Progress Note  Patient Name: Nathan Wolfe Date of Encounter: 05/12/2017  Primary Cardiologist: Quay Burow, MD    Subjective   The patient again denied chest pain again today. Stated that he felt well overall and would continue to ambulate with assistance again today. He is agreeable to completing the vein mapping process and considering moving forward from there.   Inpatient Medications    Scheduled Meds: . allopurinol  100 mg Oral Daily  . amiodarone  400 mg Oral Daily  . amLODipine  10 mg Oral Daily  . aspirin EC  81 mg Oral QHS  . brimonidine  1 drop Both Eyes QODAY  . chlorhexidine  15 mL Mouth Rinse BID  . clopidogrel  75 mg Oral Daily  . doxazosin  4 mg Oral Daily  . doxycycline  100 mg Oral Q12H  . enoxaparin (LOVENOX) injection  30 mg Subcutaneous Q24H  . ezetimibe  10 mg Oral Daily  . fenofibrate  160 mg Oral Daily  . FLUoxetine  20 mg Oral Daily  . hydrALAZINE  75 mg Oral BID  . isosorbide mononitrate  120 mg Oral Daily  . mouth rinse  15 mL Mouth Rinse q12n4p  . Melatonin  3 mg Oral QHS  . metoprolol tartrate  50 mg Oral BID  . polyethylene glycol  17 g Oral BID  . ranolazine  1,000 mg Oral BID  . senna-docusate  1 tablet Oral BID  . sodium chloride flush  3 mL Intravenous Q12H   Continuous Infusions: . sodium chloride    . ceFEPime (MAXIPIME) IV Stopped (05/11/17 2130)  . furosemide Stopped (05/12/17 0742)   PRN Meds: sodium chloride, acetaminophen, ALPRAZolam, benzonatate, diphenhydrAMINE, gi cocktail, hydrALAZINE, ipratropium-albuterol, nitroGLYCERIN, ondansetron (ZOFRAN) IV, sodium chloride flush   Vital Signs    Vitals:   05/12/17 0600 05/12/17 0645 05/12/17 0700 05/12/17  0744  BP:  (!) 115/57 (!) 122/57   Pulse: (!) 53  (!) 49   Resp: 18  (!) 24   Temp:    (!) 97.5 F (36.4 C)  TempSrc:    Oral  SpO2: 94%  99%   Weight: 117.5 kg (259 lb)     Height:        Intake/Output Summary (Last 24 hours) at 05/12/2017 0945 Last data filed at 05/12/2017 5427 Gross per 24 hour  Intake 324 ml  Output 600 ml  Net -276 ml   Filed Weights   05/10/17 0500 05/11/17 0459 05/12/17 0600  Weight: 122.1 kg (269 lb 3.2 oz) 119 kg (262 lb 4.8 oz) 117.5 kg (259 lb)    Telemetry    Sinus bradycardia - Personally Reviewed  ECG    NSR with borderline 1st degree AV block - Personally Reviewed  Physical Exam   GEN: No acute distress.  Alert and oriented x3. Neck: No JVD Cardiac: RRR, no murmurs, rubs, or gallops.  Respiratory: Clear to auscultation bilaterally. GI: Soft, nontender, non-distended  MS: No edema; No deformity. Neuro:  Nonfocal  Psych: Normal affect   Labs    Chemistry Recent Labs  Lab 05/10/17 0259 05/11/17 1109 05/11/17 1432 05/12/17 0309  NA 133* 131*  --  133*  K 4.2 4.5  --  3.7  CL 95* 93*  --  92*  CO2 24 19*  --  27  GLUCOSE 124* 158*  --  137*  BUN 91* 106*  --  107*  CREATININE 4.13* 4.30*  --  4.36*  CALCIUM 9.6 9.8  --  9.5  ALBUMIN  --   --  3.2*  --   GFRNONAA 13* 12*  --  12*  GFRAA 15* 14*  --  14*  ANIONGAP 14 19*  --  14     Hematology Recent Labs  Lab 05/10/17 0259 05/11/17 0321 05/12/17 0309  WBC 7.1 7.0 6.6  RBC 2.99* 3.12* 3.03*  HGB 9.6* 10.0* 9.6*  HCT 28.5* 29.3* 28.4*  MCV 95.3 93.9 93.7  MCH 32.1 32.1 31.7  MCHC 33.7 34.1 33.8  RDW 14.1 14.1 13.9  PLT 338 402* 430*    Cardiac Enzymes Recent Labs  Lab 05/07/17 2202 05/08/17 0833 05/08/17 1520 05/08/17 1833  TROPONINI 4.49* 3.32* 2.62* 2.50*   No results for input(s): TROPIPOC in the last 168 hours.   BNPNo results for input(s): BNP, PROBNP in the last 168 hours.   DDimer No results for input(s): DDIMER in the last 168 hours.    Radiology    US Renal  Result Date: 05/11/2017 CLINICAL DATA:  Chronic renal failure. EXAM: RENAL / URINARY TRACT ULTRASOUND COMPLETE COMPARISON:  None. FINDINGS: Right Kidney: Length: 9.3 cm. Two simple cysts are noted, with the largest measuring 10.8 cm. Echogenicity within normal limits. No mass or hydronephrosis visualized. Left Kidney: Length: 11.9 cm. Two simple cysts are noted, with the largest measuring 3.6 cm. Echogenicity within normal limits. No mass or hydronephrosis visualized. Bladder: Appears normal for degree of bladder distention. IMPRESSION: Bilateral simple renal cysts. No other significant renal abnormality seen. Electronically Signed   By: Marijo Conception, M.D.   On: 05/11/2017 15:43    Cardiac Studies   Echo3/17/19: Study Conclusions  - Left ventricle: The cavity size was normal. Wall thickness was increased in a pattern of mild LVH. Systolic function was normal. The estimated ejection fraction was in the range of 50% to 55%. Mild hypokinesis of the anteroseptal myocardium. Features are consistent with a pseudonormal left ventricular filling pattern, with concomitant abnormal relaxation and increased filling pressure (grade 2 diastolic dysfunction). Doppler parameters are consistent with high ventricular filling pressure. - Aortic valve: Transvalvular velocity was within the normal range. There was no stenosis. There was no regurgitation. - Mitral valve: Transvalvular velocity was within the normal range. There was no evidence for stenosis. There was trivial regurgitation. - Left atrium: The atrium was severely dilated. - Right ventricle: The cavity size was normal. Wall thickness was normal. Systolic function was normal. - Atrial septum: No defect or patent foramen ovale was identified by color flow Doppler. - Tricuspid valve: There was mild regurgitation. - Pulmonary arteries: Systolic pressure was moderately to  severely increased. PA peak pressure: 59 mm Hg (S).  Cardiac cath in March 2016 as per chart review from outside facility. Last catheterization was March 2016."His EFwasnormal. He has a patent LIMA to the LAD with an occluded LAD beyond LIMA insertion. He has a patent vein graft to the right status post stenting of the PDA with a 2.5 mm Synergy drug-eluting stent to the vein graft and a diseased ramus branch was with not intervened on."  Patient Profile   72 y.o. male with extensive CAD history and previous redo bypass surgery in 2000, 2011 multiple subsequent drug-eluting stents including SVG to the  PDA 2016, with known occlusion of the LAD beyond the LIMA bypass.  Is also severely diseased ramus intermedius branch not amenable to further vascularization as per the patient's primary cardiologist.  He was admitted for chest pain with elevated troponins but no corresponding EKG changes most likely secondary to demand ischemia from influenza pneumonia and heart failure exacerbation with underlying worsening of his chronic kidney disease we will continue IV diuretics due to hypoxic respiratory insufficiency.  Assessment & Plan    CAD s/p STEMI: Chest pain absent x 72 hours. We have discontinued the nitro gtt. Okay to transfer to from ICU as per cardiology as there is no clear indication to keep the patient in the ICU.  Continue ASA, Plavix, ezetimibe 10mg  daily, IMDUR 120mg  daily, Metoprolol tartrate 25mg  BID hold for HR <50, Ranolazine 12 hr tablet 1000mg (will hold tomorrow if HR remains low)  Atrial Fibrillation: Brief episode. Do not feel that triple therapy with ASA, Plavix and a third agent is appropriate in this patient. We will monitor his telemetry and discuss outpatient needs for anticoagulation moving forward. At this time, we feel that rate control with ASA/plavix will be the most efficacious recommendation with close follow-up.  Continue amiodarone but at maintenance levels of 200mg   today.  Progressively worsening CKD: Nephrology following. Continuing lasix 120mg  Q8hours as per nephrology. Cr insignificantly increased to 4.36 from 4.30. Will continue to montior daily renal function.  Vascular agreed to vein mapping and will discuss fistular creation later in the week.   Acute respiratory insufficiency w/ hypoxia: Patients baseline SpO2 unclear but inpatient status improving dramatically. However, given his description of his symptoms, I am concerned that he has been experiencing severe hypoxia for at least one year. He will need to continue oxygen weaning while in the hospital and determine need for home O2.  PT/OT consulted.  Congestive heart failure: ECHO with LV EF of 50-55% w/ preserved left ventricular systolic function and a grossly dilated left atrium. Continuing aggressive IV diuresis with nephrology.  HTN: Continue amlodipine 10mg  daily in addition to CAD/CHF medications.   Other medical issues as per primary team.   For questions or updates, please contact [place] Please consult [email or web address] for contact info under Cardiology/STEMI.   Please see attending note/attestation for current A/P.    Signed, Kathi Ludwig, MD  05/12/2017, 9:45 AM

## 2017-05-12 NOTE — Progress Notes (Signed)
Patient ID: Nathan Wolfe, male   DOB: 1945-05-26, 72 y.o.   MRN: [id number] Comfortable respiratory status without CPAP. Less swelling in his extremities today. For vein map today. Discussed with the patient and his daughter present.  Will make recommendations regarding first access and they will decide on timing.  Can proceed with fistula creation later in the week if they wish to proceed in this direction

## 2017-05-12 NOTE — Progress Notes (Signed)
Pt has home CPAP and places himself on and off with the help of family or RN. Will cont to monitor and available if and when needed.

## 2017-05-12 NOTE — Progress Notes (Signed)
[facility] ROUNDING NOTE   Subjective:   72 y.o. year-old with hx of CAD hx CABG 2000 and redo 2011, sp L CEA, CKD stage IV,obesity , OSA on CPAP admitted on 05/01/17 with c/o L sided chest pain.  Was admitted to SDU for unstable angina.  Seen by cardiology, hx complex anatomy, CABG and redo CABG, they recommended medical Rx w IV NTG and heparin.  Trop peaked at 4.49  EF of 50-55%, Grade 2 DD, no RWMA   Creat was 2.8 on admission and is now up to 4.1   Great diuresis with a weight that has decreased to 259 lbs from 273  IV lasix 120mg  q 8hours  He developed flu and pneumonia and is being treated with antibiotics - cefepime and doxyclycline  kidney doctor in [facility], he is up visting family here with his wife.  His wife is a dialysis patient. Appreciate help from Dr Early         Objective:  Vital signs in last 24 hours:  Temp:  [97.5 F (36.4 C)-97.7 F (36.5 C)] 97.5 F (36.4 C) (03/26 1129) Pulse Rate:  [46-53] 49 (03/26 0700) Resp:  [12-24] 24 (03/26 0700) BP: (102-122)/(47-57) 122/57 (03/26 0700) SpO2:  [92 %-100 %] 99 % (03/26 0700) Weight:  [259 lb (117.5 kg)] 259 lb (117.5 kg) (03/26 0600)  Weight change: -3 lb 4.8 oz (-1.497 kg) Filed Weights   05/10/17 0500 05/11/17 0459 05/12/17 0600  Weight: 269 lb 3.2 oz (122.1 kg) 262 lb 4.8 oz (119 kg) 259 lb (117.5 kg)    Intake/Output: I/O last 3 completed shifts: In: 748 [P.O.:400; IV Piggyback:348] Out: 1000 [Urine:1000]   Intake/Output this shift:  Total I/O In: -  Out: 575 [Urine:575]  CVS- RRR RS- CTA ABD- BS present soft non-distended EXT- no edema   Basic Metabolic Panel: Recent Labs  Lab 05/08/17 0259 05/09/17 0352 05/10/17 0259 05/11/17 1109 05/12/17 0309  NA 135 132* 133* 131* 133*  K 4.5 4.4 4.2 4.5 3.7  CL 99* 94* 95* 93* 92*  CO2 21* 24 24 19* 27  GLUCOSE 127* 135* 124* 158* 137*  BUN 72* 80* 91* 106* 107*  CREATININE 3.22* 3.81* 4.13* 4.30* 4.36*  CALCIUM 9.2 9.4 9.6 9.8 9.5     Liver Function Tests: Recent Labs  Lab 05/11/17 1432  ALBUMIN 3.2*   No results for input(s): LIPASE, AMYLASE in the last 168 hours. No results for input(s): AMMONIA in the last 168 hours.  CBC: Recent Labs  Lab 05/08/17 0259 05/09/17 0352 05/10/17 0259 05/11/17 0321 05/12/17 0309  WBC 6.1 8.4 7.1 7.0 6.6  NEUTROABS 3.8 6.0 4.9 4.6 4.5  HGB 9.8* 10.7* 9.6* 10.0* 9.6*  HCT 29.2* 31.6* 28.5* 29.3* 28.4*  MCV 96.1 96.6 95.3 93.9 93.7  PLT 228 304 338 402* 430*    Cardiac Enzymes: Recent Labs  Lab 05/07/17 1542 05/07/17 2202 05/08/17 0833 05/08/17 1520 05/08/17 1833  TROPONINI 4.22* 4.49* 3.32* 2.62* 2.50*    BNP: Invalid input(s): POCBNP  CBG: No results for input(s): GLUCAP in the last 168 hours.  Microbiology: Results for orders placed or performed during the hospital encounter of 04/30/17  Culture, blood (routine x 2)     Status: None   Collection Time: 05/02/17  1:06 AM  Result Value Ref Range Status   Specimen Description BLOOD RIGHT HAND  Final   Special Requests   Final    BOTTLES DRAWN AEROBIC AND ANAEROBIC Blood Culture results may not be optimal  due to an inadequate volume of blood received in culture bottles   Culture   Final    NO GROWTH 5 DAYS Performed at [facility], [place] [address], [place], [place] [address]    Report Status 05/07/2017 FINAL  Final  Culture, blood (routine x 2)     Status: None   Collection Time: 05/02/17  1:07 AM  Result Value Ref Range Status   Specimen Description BLOOD RIGHT HAND  Final   Special Requests   Final    BOTTLES DRAWN AEROBIC AND ANAEROBIC Blood Culture results may not be optimal due to an inadequate volume of blood received in culture bottles   Culture   Final    NO GROWTH 5 DAYS Performed at [facility], [place] [address], [place], [place] [address]    Report Status 05/07/2017 FINAL  Final  MRSA PCR Screening     Status: None   Collection Time: 05/02/17  9:45 AM  Result Value Ref Range  Status   MRSA by PCR NEGATIVE NEGATIVE Final    Comment:        The GeneXpert MRSA Assay (FDA approved for NASAL specimens only), is one component of a comprehensive MRSA colonization surveillance program. It is not intended to diagnose MRSA infection nor to guide or monitor treatment for MRSA infections. Performed at [facility], [place] [address], [place], [place] [address]   MRSA PCR Screening     Status: None   Collection Time: 05/04/17  5:15 PM  Result Value Ref Range Status   MRSA by PCR NEGATIVE NEGATIVE Final    Comment:        The GeneXpert MRSA Assay (FDA approved for NASAL specimens only), is one component of a comprehensive MRSA colonization surveillance program. It is not intended to diagnose MRSA infection nor to guide or monitor treatment for MRSA infections. Performed at [facility], [place] [address] [place], [place] [address]   Culture, expectorated sputum-assessment     Status: None   Collection Time: 05/04/17  6:06 PM  Result Value Ref Range Status   Specimen Description EXPECTORATED SPUTUM  Final   Special Requests NONE  Final   Sputum evaluation   Final    THIS SPECIMEN IS ACCEPTABLE FOR SPUTUM CULTURE Performed at [facility], [place] [address] [place], [place] [address]    Report Status 05/04/2017 FINAL  Final  Culture, respiratory (NON-Expectorated)     Status: None   Collection Time: 05/04/17  6:06 PM  Result Value Ref Range Status   Specimen Description EXPECTORATED SPUTUM  Final   Special Requests NONE Reflexed from [id number]  Final   Gram Stain   Final    FEW WBC PRESENT, PREDOMINANTLY PMN FEW SQUAMOUS EPITHELIAL CELLS PRESENT FEW GRAM POSITIVE COCCI IN CLUSTERS FEW GRAM NEGATIVE RODS RARE GRAM POSITIVE RODS    Culture   Final    Consistent with normal respiratory flora. Performed at [facility], [place] [address], [place],  [address]    Report Status 05/07/2017 FINAL  Final  Culture, blood (routine x 2) Call  MD if unable to obtain prior to antibiotics being given     Status: None   Collection Time: 05/04/17  8:30 PM  Result Value Ref Range Status   Specimen Description BLOOD RIGHT HAND  Final   Special Requests Blood Culture adequate volume BLOOD AEROBIC BOTTLE  Final   Culture   Final    NO GROWTH  5 DAYS Performed at [facility], [place] [address], [place], [place] [address]    Report Status 05/09/2017 FINAL  Final  Culture, blood (routine x 2) Call MD if unable to obtain prior to antibiotics being given     Status: None   Collection Time: 05/04/17  8:35 PM  Result Value Ref Range Status   Specimen Description BLOOD RIGHT HAND  Final   Special Requests BLOOD AEROBIC BOTTLE Blood Culture adequate volume  Final   Culture   Final    NO GROWTH 5 DAYS Performed at [facility], [place] [address] [place], [place] [address]    Report Status 05/09/2017 FINAL  Final  Culture, blood (routine x 2)     Status: None   Collection Time: 05/07/17  4:38 PM  Result Value Ref Range Status   Specimen Description BLOOD RIGHT HAND  Final   Special Requests   Final    BOTTLES DRAWN AEROBIC ONLY Blood Culture adequate volume   Culture   Final    NO GROWTH 5 DAYS Performed at [facility], [place] [address], [place], [place] [address]    Report Status 05/12/2017 FINAL  Final  Culture, blood (routine x 2)     Status: None   Collection Time: 05/07/17  4:43 PM  Result Value Ref Range Status   Specimen Description BLOOD LEFT HAND  Final   Special Requests   Final    BOTTLES DRAWN AEROBIC ONLY Blood Culture adequate volume   Culture   Final    NO GROWTH 5 DAYS Performed at [facility], [place] [address], [place], [place] [address]    Report Status 05/12/2017 FINAL  Final    Coagulation Studies: No results for input(s): LABPROT, INR in the last 72 hours.  Urinalysis: No results for input(s): COLORURINE, LABSPEC, PHURINE, GLUCOSEU, HGBUR, BILIRUBINUR, KETONESUR, PROTEINUR, UROBILINOGEN,  NITRITE, LEUKOCYTESUR in the last 72 hours.  Invalid input(s): APPERANCEUR    Imaging: US Renal  Result Date: 05/11/2017 CLINICAL DATA:  Chronic renal failure. EXAM: RENAL / URINARY TRACT ULTRASOUND COMPLETE COMPARISON:  None. FINDINGS: Right Kidney: Length: 9.3 cm. Two simple cysts are noted, with the largest measuring 10.8 cm. Echogenicity within normal limits. No mass or hydronephrosis visualized. Left Kidney: Length: 11.9 cm. Two simple cysts are noted, with the largest measuring 3.6 cm. Echogenicity within normal limits. No mass or hydronephrosis visualized. Bladder: Appears normal for degree of bladder distention. IMPRESSION: Bilateral simple renal cysts. No other significant renal abnormality seen. Electronically Signed   By: Marijo Conception, M.D.   On: 05/11/2017 15:43     Medications:   . sodium chloride    . ceFEPime (MAXIPIME) IV Stopped (05/11/17 2130)  . furosemide Stopped (05/12/17 0742)   . allopurinol  100 mg Oral Daily  . [START ON 05/13/2017] amiodarone  200 mg Oral Daily  . amLODipine  10 mg Oral Daily  . aspirin EC  81 mg Oral QHS  . brimonidine  1 drop Both Eyes QODAY  . chlorhexidine  15 mL Mouth Rinse BID  . clopidogrel  75 mg Oral Daily  . doxazosin  4 mg Oral Daily  . doxycycline  100 mg Oral Q12H  . enoxaparin (LOVENOX) injection  30 mg Subcutaneous Q24H  . ezetimibe  10 mg Oral Daily  . fenofibrate  160 mg Oral Daily  . FLUoxetine  20 mg Oral Daily  . hydrALAZINE  75 mg Oral BID  . isosorbide mononitrate  120 mg Oral Daily  . mouth rinse  15 mL Mouth Rinse q12n4p  . Melatonin  3 mg Oral QHS  . metoprolol tartrate  25 mg Oral BID  . polyethylene glycol  17 g Oral BID  . ranolazine  1,000 mg Oral BID  . senna-docusate  1 tablet Oral BID  . sodium chloride flush  3 mL Intravenous Q12H   sodium chloride, acetaminophen, ALPRAZolam, benzonatate, diphenhydrAMINE, gi cocktail, hydrALAZINE, ipratropium-albuterol, nitroGLYCERIN, ondansetron (ZOFRAN) IV, sodium  chloride flush  Assessment/ Plan:  Impression: 1 Acute on CKD IV - suspect due to decomp CHF. Looks better today, getting OOB.  Seems to be diuresing now but not collecting all UOP per dtr. Will cont high dose IV lasix, suspect renal function will level off soon. No indication for RRT. Renal ultrasound simple cysts 2 CKD stage IV - baseline creat 2.8 - 3.5 per family, f/b nephrology in [facility]. Get UA 3 Chest pain / unstable angina - on admit 3/15, better now 4 HTN - on 4 bp meds here as at home, may need to reduce if pt starts to diurese 5 CAD hx CABG 2000/ redo CABG 2011 - EF 50-55 % by echo here 6 HL 7. Anemia  Hemoglobin 10 Tsats 20%       LOS: 11 Senica Crall W @TODAY @1 :39 PM

## 2017-05-12 NOTE — Progress Notes (Signed)
PROGRESS NOTE  Nathan Wolfe KYH:[id number] DOB: 1945-11-01 DOA: 04/30/2017 PCP: Patient, No Pcp Per  HPI/Recap of past 24 hours: Nathan Wolfe a 72 y.o.malewithcomplicated cardiac history including CADs/pCABG x2 and multiple stents, carotid artery stenosis s/pleft carotid endarterectomy 09/2016, HTN, HLD,CKD stage IVnot on hemodialysis,OSA on CPAP,and morbid obesity;who presents with complaints of acute onset of left-sided chest pain while sitting watching TV PTA. Patient reported pain radiating into his jaw similar to previous heart attacks. Patient reported taking approximately 4 nitros with just temporary relief of symptoms before return of pain symptoms. In the ER, pt was noted to be flu positive, flat troponin, admitted for further management.  Patient is Cefepime and Doxycycline for possible pneumonia. Plan was to proceed with CT Chest without contrast, but patient and patient's daughter were concerned patient could not lay down flat for CT Chest due to pulmonary edema.  Cardiology team does not plan any further invasive studies.  Patient's cardiologist is based on above [place].  From cardiac point, the patient has been managed medically.  Acetyl cysteine has been discontinued also no further cardiac workup is planned.  Patient has been on high-dose Lasix for volume overload.  Patient has diuresed significantly.  Patient has also lost a significant amount of weight.  However, his serum creatinine has been on the right.  Nephrology is following patient, and directing care.  Plan is to continue high-dose diuretics for now.  Will complete course of antibiotics.  Will proceed with CT of the chest without contrast when possible.  Will consider discharge when patient's volume is optimize.  Input from cardiology, nephrology is highly appreciated.  Further management will depend on hospital course..   05/12/2017: Patient seen alongside patient's daughter.  Patient continues to improve  daily.  No further chest pain reported.  No shortness of breath.  No fever or chills.  Echocardiogram revealed moderate to severe pulmonary hypertension with PA pressure of 59 mmHg  Overall, the patient's prognosis is guarded.   Assessment/Plan: Principal Problem:   Anxiety Active Problems:   Unstable angina (HCC)   Anemia due to chronic kidney disease   Essential hypertension   OSA on CPAP   NSTEMI (non-ST elevated myocardial infarction) ([place])   Demand ischemia (HCC)   Influenza A   Acute respiratory failure with hypoxia (HCC)   Pulmonary edema   Paroxysmal atrial fibrillation (HCC)   Acute on chronic diastolic heart failure (HCC)   Acute renal failure superimposed on stage 4 chronic kidney disease (HCC)   Chest pain   Goals of care, counseling/discussion   Palliative care by specialist  Sepsis 2/2 Influenza A with possible PNA Last temp 102.2 on 3/20, no luekocytosis BC X 2 NGTD, repeat NGTD Procalcitonin trending downwards Sputum cx with few Gram + cocci in clusters, few gram - rods. MRSA PCR negative CXR: with stable pulm edema Vs PNA Completed tamiflu Continue cefepime, doxycycline due to ?influenza PNA, although MRSA PCR neg 05/12/2017: Will proceed with CT scan of the chest without contrast when patient is able to lay down flat.   If CT scan of the chest is negative for pneumonia, will de-escalate patient's antibiotics.  Unstable angina/NSTEMI No further chest pain Extensive cardiac hx as mentioned above Trop peaked at 4.49, now down trending, EKG with t wave changes  ECHO with EF of 50-55%, Grade 2 DD, no RWMA Cardiology on board: added ranexa 500 mg BID. Still a poor candidate for cardiac cath due to extensive prior occlusions, with a low chance of successful  revascularization, marginal resp status, worsening CKD Continue ASA, plavix, imdur, metoprolol, NTG prn Monitor closely  Acute on chronic diastolic HF Repeat CXR with stable pulm edema ECHO as  above Aggressive diuresis. BUN and creatinine continue to rise.   High dose diuretics as per Nephrology.  AKI on CKD stage IV Creatinine of 3.4 on admission-->currently rising Follows with nephrology in [place], not on HD Nephrology consulted 04/18/2017-worsening BUN and creatinine noted.   P.Afib Currently HR controlled Stopped IV amiodarone, switched to PO amiodarone Cardiology on board, contemplating [facility], ?possible NOAC on discharge  Essential hypertension Stable Continue metoprolol (50 BID), hydralazine (increased to 75 BID), amlodipine (increased to 10), imdur, lasix  Anemia of chronic disease Stable, monitor closely  BPH Continue Cardura  Dyslipidemia Continue fenofibrate, started zetia  OSA on CPAP CPAP at night  Anxiety Continue xanax prn Psych consulted, no further recs  GOC Palliative consulted, continue full scope of treatment/management except heart surgery  Code Status: Full  Family Communication: Daughter at bedside  Disposition Plan: Once work-up complete.  Consultants:  Cardiology  Nephrology  Procedures:  None  Antimicrobials:  Cefepime  Doxcycline   Tamiflu (completed course)  DVT prophylaxis:  Lovenox   Objective: Vitals:   05/12/17 0645 05/12/17 0700 05/12/17 0744 05/12/17 1129  BP: (!) 115/57 (!) 122/57    Pulse:  (!) 49    Resp:  (!) 24    Temp:   (!) 97.5 F (36.4 C) (!) 97.5 F (36.4 C)  TempSrc:   Oral Oral  SpO2:  99%    Weight:      Height:        Intake/Output Summary (Last 24 hours) at 05/12/2017 1205 Last data filed at 05/12/2017 1138 Gross per 24 hour  Intake 124 ml  Output 1175 ml  Net -1051 ml   Filed Weights   05/10/17 0500 05/11/17 0459 05/12/17 0600  Weight: 122.1 kg (269 lb 3.2 oz) 119 kg (262 lb 4.8 oz) 117.5 kg (259 lb)    Exam:   General:  NAD   Cardiovascular: S1, S2 present  Respiratory: Mild crackles noted b/l at the bases  Abdomen: Soft, NT, ND, BS  present  Musculoskeletal: No pedal edema  Skin: Normal  Psychiatry: Normal mood   Data Reviewed: CBC: Recent Labs  Lab 05/08/17 0259 05/09/17 0352 05/10/17 0259 05/11/17 0321 05/12/17 0309  WBC 6.1 8.4 7.1 7.0 6.6  NEUTROABS 3.8 6.0 4.9 4.6 4.5  HGB 9.8* 10.7* 9.6* 10.0* 9.6*  HCT 29.2* 31.6* 28.5* 29.3* 28.4*  MCV 96.1 96.6 95.3 93.9 93.7  PLT 228 304 338 402* 878*   Basic Metabolic Panel: Recent Labs  Lab 05/08/17 0259 05/09/17 0352 05/10/17 0259 05/11/17 1109 05/12/17 0309  NA 135 132* 133* 131* 133*  K 4.5 4.4 4.2 4.5 3.7  CL 99* 94* 95* 93* 92*  CO2 21* 24 24 19* 27  GLUCOSE 127* 135* 124* 158* 137*  BUN 72* 80* 91* 106* 107*  CREATININE 3.22* 3.81* 4.13* 4.30* 4.36*  CALCIUM 9.2 9.4 9.6 9.8 9.5   GFR: Estimated Creatinine Clearance: 18.5 mL/min (A) (by C-G formula based on SCr of 4.36 mg/dL (H)). Liver Function Tests: Recent Labs  Lab 05/11/17 1432  ALBUMIN 3.2*   No results for input(s): LIPASE, AMYLASE in the last 168 hours. No results for input(s): AMMONIA in the last 168 hours. Coagulation Profile: No results for input(s): INR, PROTIME in the last 168 hours. Cardiac Enzymes: Recent Labs  Lab 05/07/17 1542 05/07/17 2202 05/08/17 6767  05/08/17 1520 05/08/17 1833  TROPONINI 4.22* 4.49* 3.32* 2.62* 2.50*   BNP (last 3 results) No results for input(s): PROBNP in the last 8760 hours. HbA1C: No results for input(s): HGBA1C in the last 72 hours. CBG: No results for input(s): GLUCAP in the last 168 hours. Lipid Profile: No results for input(s): CHOL, HDL, LDLCALC, TRIG, CHOLHDL, LDLDIRECT in the last 72 hours. Thyroid Function Tests: No results for input(s): TSH, T4TOTAL, FREET4, T3FREE, THYROIDAB in the last 72 hours. Anemia Panel: Recent Labs    05/11/17 1109  TIBC 308  IRON 63   Urine analysis: No results found for: COLORURINE, APPEARANCEUR, LABSPEC, PHURINE, GLUCOSEU, HGBUR, BILIRUBINUR, KETONESUR, PROTEINUR, UROBILINOGEN,  NITRITE, LEUKOCYTESUR Sepsis Labs: @LABRCNTIP (procalcitonin:4,lacticidven:4)  ) Recent Results (from the past 240 hour(s))  MRSA PCR Screening     Status: None   Collection Time: 05/04/17  5:15 PM  Result Value Ref Range Status   MRSA by PCR NEGATIVE NEGATIVE Final    Comment:        The GeneXpert MRSA Assay (FDA approved for NASAL specimens only), is one component of a comprehensive MRSA colonization surveillance program. It is not intended to diagnose MRSA infection nor to guide or monitor treatment for MRSA infections. Performed at [facility], [place] [address], [place], [place] [address]   Culture, expectorated sputum-assessment     Status: None   Collection Time: 05/04/17  6:06 PM  Result Value Ref Range Status   Specimen Description EXPECTORATED SPUTUM  Final   Special Requests NONE  Final   Sputum evaluation   Final    THIS SPECIMEN IS ACCEPTABLE FOR SPUTUM CULTURE Performed at [facility], [place] [address], [place], [place] [address]    Report Status 05/04/2017 FINAL  Final  Culture, respiratory (NON-Expectorated)     Status: None   Collection Time: 05/04/17  6:06 PM  Result Value Ref Range Status   Specimen Description EXPECTORATED SPUTUM  Final   Special Requests NONE Reflexed from [id number]  Final   Gram Stain   Final    FEW WBC PRESENT, PREDOMINANTLY PMN FEW SQUAMOUS EPITHELIAL CELLS PRESENT FEW GRAM POSITIVE COCCI IN CLUSTERS FEW GRAM NEGATIVE RODS RARE GRAM POSITIVE RODS    Culture   Final    Consistent with normal respiratory flora. Performed at [facility], [place] [address] [place], [place] [address]    Report Status 05/07/2017 FINAL  Final  Culture, blood (routine x 2) Call MD if unable to obtain prior to antibiotics being given     Status: None   Collection Time: 05/04/17  8:30 PM  Result Value Ref Range Status   Specimen Description BLOOD RIGHT HAND  Final   Special Requests Blood Culture adequate volume BLOOD AEROBIC BOTTLE  Final    Culture   Final    NO GROWTH 5 DAYS Performed at [facility], [place] [address], [place], [place] [address]    Report Status 05/09/2017 FINAL  Final  Culture, blood (routine x 2) Call MD if unable to obtain prior to antibiotics being given     Status: None   Collection Time: 05/04/17  8:35 PM  Result Value Ref Range Status   Specimen Description BLOOD RIGHT HAND  Final   Special Requests BLOOD AEROBIC BOTTLE Blood Culture adequate volume  Final   Culture   Final    NO GROWTH 5 DAYS Performed at [facility], [place] [address], [place], [place] [address]    Report  Status 05/09/2017 FINAL  Final  Culture, blood (routine x 2)     Status: None   Collection Time: 05/07/17  4:38 PM  Result Value Ref Range Status   Specimen Description BLOOD RIGHT HAND  Final   Special Requests   Final    BOTTLES DRAWN AEROBIC ONLY Blood Culture adequate volume   Culture   Final    NO GROWTH 5 DAYS Performed at [facility], [place] [address] [place], [place] [address]    Report Status 05/12/2017 FINAL  Final  Culture, blood (routine x 2)     Status: None   Collection Time: 05/07/17  4:43 PM  Result Value Ref Range Status   Specimen Description BLOOD LEFT HAND  Final   Special Requests   Final    BOTTLES DRAWN AEROBIC ONLY Blood Culture adequate volume   Culture   Final    NO GROWTH 5 DAYS Performed at [facility], [place] [address] [place], [place] [address]    Report Status 05/12/2017 FINAL  Final      Studies: US Renal  Result Date: 05/11/2017 CLINICAL DATA:  Chronic renal failure. EXAM: RENAL / URINARY TRACT ULTRASOUND COMPLETE COMPARISON:  None. FINDINGS: Right Kidney: Length: 9.3 cm. Two simple cysts are noted, with the largest measuring 10.8 cm. Echogenicity within normal limits. No mass or hydronephrosis visualized. Left Kidney: Length: 11.9 cm. Two simple cysts are noted, with the largest measuring 3.6 cm. Echogenicity within normal limits. No mass or hydronephrosis  visualized. Bladder: Appears normal for degree of bladder distention. IMPRESSION: Bilateral simple renal cysts. No other significant renal abnormality seen. Electronically Signed   By: Marijo Conception, M.D.   On: 05/11/2017 15:43    Scheduled Meds: . allopurinol  100 mg Oral Daily  . amiodarone  400 mg Oral Daily  . amLODipine  10 mg Oral Daily  . aspirin EC  81 mg Oral QHS  . brimonidine  1 drop Both Eyes QODAY  . chlorhexidine  15 mL Mouth Rinse BID  . clopidogrel  75 mg Oral Daily  . doxazosin  4 mg Oral Daily  . doxycycline  100 mg Oral Q12H  . enoxaparin (LOVENOX) injection  30 mg Subcutaneous Q24H  . ezetimibe  10 mg Oral Daily  . fenofibrate  160 mg Oral Daily  . FLUoxetine  20 mg Oral Daily  . hydrALAZINE  75 mg Oral BID  . isosorbide mononitrate  120 mg Oral Daily  . mouth rinse  15 mL Mouth Rinse q12n4p  . Melatonin  3 mg Oral QHS  . metoprolol tartrate  25 mg Oral BID  . polyethylene glycol  17 g Oral BID  . ranolazine  1,000 mg Oral BID  . senna-docusate  1 tablet Oral BID  . sodium chloride flush  3 mL Intravenous Q12H    Continuous Infusions: . sodium chloride    . ceFEPime (MAXIPIME) IV Stopped (05/11/17 2130)  . furosemide Stopped (05/12/17 0742)     LOS: 11 days     Bonnell Public, MD [facility]   If 7PM-7AM, please contact night-coverage [email or web address] Password [facility] 05/12/2017, 12:05 PM

## 2017-05-13 LAB — CBC WITH DIFFERENTIAL/PLATELET
BASOS ABS: 0 10*3/uL (ref 0.0–0.1)
BASOS PCT: 0 %
EOS ABS: 0.5 10*3/uL (ref 0.0–0.7)
Eosinophils Relative: 7 %
HEMATOCRIT: 32.5 % — AB (ref 39.0–52.0)
HEMOGLOBIN: 11 g/dL — AB (ref 13.0–17.0)
Lymphocytes Relative: 16 %
Lymphs Abs: 1.2 10*3/uL (ref 0.7–4.0)
MCH: 32.1 pg (ref 26.0–34.0)
MCHC: 33.8 g/dL (ref 30.0–36.0)
MCV: 94.8 fL (ref 78.0–100.0)
MONOS PCT: 12 %
Monocytes Absolute: 0.9 10*3/uL (ref 0.1–1.0)
NEUTROS ABS: 4.7 10*3/uL (ref 1.7–7.7)
NEUTROS PCT: 65 %
Platelets: 540 10*3/uL — ABNORMAL HIGH (ref 150–400)
RBC: 3.43 MIL/uL — AB (ref 4.22–5.81)
RDW: 13.9 % (ref 11.5–15.5)
WBC: 7.2 10*3/uL (ref 4.0–10.5)

## 2017-05-13 LAB — BASIC METABOLIC PANEL
ANION GAP: 17 — AB (ref 5–15)
BUN: 114 mg/dL — ABNORMAL HIGH (ref 6–20)
CALCIUM: 9.9 mg/dL (ref 8.9–10.3)
CO2: 25 mmol/L (ref 22–32)
CREATININE: 4.72 mg/dL — AB (ref 0.61–1.24)
Chloride: 91 mmol/L — ABNORMAL LOW (ref 101–111)
GFR calc Af Amer: 13 mL/min — ABNORMAL LOW (ref >60)
GFR calc non Af Amer: 11 mL/min — ABNORMAL LOW (ref >60)
Glucose, Bld: 119 mg/dL — ABNORMAL HIGH (ref 65–99)
Potassium: 4 mmol/L (ref 3.5–5.1)
SODIUM: 133 mmol/L — AB (ref 135–145)

## 2017-05-13 MED ORDER — RANOLAZINE ER 500 MG PO TB12
500.0000 mg | ORAL_TABLET | Freq: Two times a day (BID) | ORAL | Status: DC
  Administered 2017-05-13 – 2017-05-18 (×10): 500 mg via ORAL
  Filled 2017-05-13 (×10): qty 1

## 2017-05-13 NOTE — Progress Notes (Signed)
Patient ID: Nathan Wolfe, male   DOB: 10-31-1945, 72 y.o.   MRN: [id number] Respiratory comfortable. Reviewed vein map suggesting adequate cephalic veins bilaterally for fistula.  He is left-handed.  His right arm is being restricted. I again had a long discussion with the patient, his daughter and wife by telephone.  Have recommended a right radiocephalic or brachiocephalic fistula depending on the size at the time.  He is having increasingly elevated creatinine and I would recommend proceeding at this point with AV fistula creation.  He has had some discussion with nephrology regarding the potential need for tunneled dialysis catheter during this admission for hemodialysis but does not reach that point yet.  We are available for catheter as well if needed.  I tentatively scheduled him for right arm AV fistula on Friday, March 29

## 2017-05-13 NOTE — Progress Notes (Signed)
Patient has home CPAP at bedside and does not need helping placing it on. RT will continue to monitor.

## 2017-05-13 NOTE — Progress Notes (Signed)
Physical Therapy Treatment Patient Details Name: Nathan Wolfe MRN: [id number] DOB: Feb 23, 1945 Today's Date: 05/13/2017    History of Present Illness Pt is a 72 y.o. male admitted 04/30/08 with chest pain not relieved by NG; worked up for STEMI with elevated troponins likely secondary to demand ischemia from influenza pneumonia and HF exacerbation with underlying worsening CKD. Worsening kidney function and may require HD. 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    PT Comments    Pt able to ambulate 400' with RW and supervision for safety; requires multiple standing rest breaks secondary to c/o chronic lower back pain. Does not endorse dyspnea this session. SpO2 >91% on RA throughout. Educ on importance of continued mobility, and recommend pt continue ambulating with RN/NT staff as able. Will continue to follow acutely.   Follow Up Recommendations  Home health PT;Supervision/Assistance - 24 hour     Equipment Recommendations  Rolling walker with 5" wheels    Recommendations for Other Services       Precautions / Restrictions Precautions Precautions: Fall Restrictions Weight Bearing Restrictions: No    Mobility  Bed Mobility Overal bed mobility: Needs Assistance Bed Mobility: Supine to Sit     Supine to sit: Min assist     General bed mobility comments: Daughter assisting pt up to EOB to use urinal as PT entering room.  Transfers Overall transfer level: Needs assistance Equipment used: Rolling walker (2 wheeled) Transfers: Sit to/from Stand Sit to Stand: Supervision            Ambulation/Gait Ambulation/Gait assistance: Min guard;Supervision Ambulation Distance (Feet): 400 Feet Assistive device: Rolling walker (2 wheeled) Gait Pattern/deviations: Step-through pattern;Decreased stride length;Trunk flexed Gait velocity: Decreased Gait velocity  interpretation: Below normal speed for age/gender General Gait Details: Slow, controlled amb with RW; intial min guard progressing to supervision. Multiple standing rest breaks which pt reports due to c/o chronic back pain. Repeated cues to maintain upright posture with RW   Stairs            Wheelchair Mobility    Modified Rankin (Stroke Patients Only)       Balance Overall balance assessment: Needs assistance Sitting-balance support: Feet supported;No upper extremity supported Sitting balance-[name]: Good     Standing balance support: During functional activity;Single extremity supported Standing balance-[name]: Fair Standing balance comment: Can static stand with no UE support; dynamic stability improved with BUE support                            Cognition Arousal/Alertness: Awake/alert Behavior During Therapy: WFL for tasks assessed/performed Overall Cognitive Status: Within Functional Limits for tasks assessed                                 General Comments: H/o depression/anxiety per daughter. WFL this session      Exercises      General Comments General comments (skin integrity, edema, etc.): Daughter present throughout session. SpO2 >91% on RA throughout      Pertinent Vitals/Pain Pain Assessment: No/denies pain    Home Living                      Prior Function            PT Goals (current  goals can now be found in the care plan section) Acute Rehab PT Goals Patient Stated Goal: Home to daughter's house at d/c. Hopeful to get back to [facility] and flying his plane again PT Goal Formulation: With patient/family Time For Goal Achievement: 05/25/17 Potential to Achieve Goals: Good Progress towards PT goals: Progressing toward goals    Frequency    Min 3X/week      PT Plan Current plan remains appropriate    Co-evaluation              AM-PAC PT "6 Clicks" Daily Activity  Outcome Measure   Difficulty turning over in bed (including adjusting bedclothes, sheets and blankets)?: None Difficulty moving from lying on back to sitting on the side of the bed? : A Little Difficulty sitting down on and standing up from a chair with arms (e.g., wheelchair, bedside commode, etc,.)?: A Little Help needed moving to and from a bed to chair (including a wheelchair)?: A Little Help needed walking in hospital room?: A Little Help needed climbing 3-5 steps with a railing? : A Little 6 Click Score: 19    End of Session Equipment Utilized During Treatment: Gait belt Activity Tolerance: Patient tolerated treatment well Patient left: in bed;with call bell/phone within reach;with family/visitor present Nurse Communication: Mobility status PT Visit Diagnosis: Unsteadiness on feet (R26.81);Difficulty in walking, not elsewhere classified (R26.2)     Time: 1430-1456 PT Time Calculation (min) (ACUTE ONLY): 26 min  Charges:  $Gait Training: 8-22 mins $Therapeutic Activity: 8-22 mins                    G Codes:      Mabeline Caras, PT, DPT Acute Rehab Services  Pager: [place] 05/13/2017, 3:11 PM

## 2017-05-13 NOTE — Progress Notes (Signed)
Upon entering the pt's room found pt in bathroom with daughter assisting. Instructed once again that only hospital should help that pt oob. Pt states he understands. Will continue to monitor. Eleonore Chiquito RN 2 Heart CV[facility]

## 2017-05-13 NOTE — Progress Notes (Signed)
Hospitalist progress note   Nathan Wolfe  VPX:[id number] DOB: [birth year] DOA: 04/30/2017 PCP: Patient, No Pcp Per Specialists:  Cardiology Nephrology Vascular surgery  Brief Narrative:  72 year old male originally from [place] with Dr. Berneice Gandy [phone] Cardiology, [facility] CAD status post CABG x2+ stent, carotid stenosis status post left carotid endarterectomy 8/18, HTN, HLD, CKD 4 not on dialysis, OSA on CPAP, morbid obesity-initially admitted 05/01/2017 with left-sided chest pain after dinner and radiation to the jaw with temporary relief from nitroglycerin Recently treated for persistent cough with azithromycin at an urgent care Admitted secondary to chest pain on admission blood pressures 140s-170s heart rate within normal limits no white count creatinine 3.4-of no EKG found to have significant ST depression signs of ischemia and cardiology started on heparin drip at that time  Assessment & Plan:   Assessment:  The primary encounter diagnosis was Unstable angina ([place]). Diagnoses of Chest pain, Cough, Abnormal CXR, Hypoxia, Pulmonary edema, Respiratory distress, Constipation, and Chronic renal failure were also pertinent to this visit.   Sepsis secondary to flu, possible pneumonia ?-monitor--last temp 102.2 3/20 Blood culture no growth to date and sputum culture gram-positive cocci few gram-negative rods Now has had 12 days of cefepime doxycycline-stopped Abx 3.27 Will not Order CT chest-explained to patient high likelihood of complete Rx Bact PNA and understands  abd pain-has some pain when coughin-exam not signif for hernia--monitor  Unstable angina-admitted to medical floor-last cath 04/2014 Kept on aspirin Plavix placed on nitroglycerin infusion cardiology evaluation felt--high risk of nephrotoxicity with cardiac cath-troponin peak was 4.4-Felt most appropriate for medical management placed on amlodipine ranolazine, metoprolol, long-acting nitrate Ranexa down  to 500 bid per cards  New onset paroxysmal atrial flutter fib-cardiology managing placed on amiodarone with some sinus bradycardia so meds adjusted-metoprolol dropped to 25 twice daily-anticoagulation ?recurrecen afib--Dfer to cardiology  Acute/chronic diastolic heart failure-nephrology consulted given the rising creatinine from admission 63/2.9-->114/4.7, patient is net -3.2 L and weight is down from 123.5-117 currently--nephrology managing diuresis Lasix IV 120 every 8 desat screen shows need for oxygen will repeat once on tele floor-has been more dyspneoic over the past year  AK I superimposed on CKD stage IV-creatinine rising-see above discussion --Vascular has been consulted and vein mapping performed 3/27 for fistula creation dependent on wishes for dialysis versus not Will need further discussion of implications dialysis with Neprho/Vasc  Hypertension-Moderately controlled with some hypotension now resolved-current meds of amiodarone 200 daily, amlodipine 10, hydralazine 75 twice daily, Imdur 120 daily, metoprolol 25 twice daily  Bipolar continue Xanax 0.5 3 times daily as needed, fluoxetine 20 daily  BPH continue Cardura 4 mg daily  OSA on CPAP at night  Anemia of chronic disease  Hyperlipidemia continue statin--discussion per cardiology  DVT prophylaxis: Lovenoex  Code Status:    ful   Family Communication:   D/w daughter extensively Disposition Plan: IP transfer to tele   Consultants:   Cardiology  Nephrology  Renal  Procedures:   Vein mapping 3/26  ECho 3/17-Study Conclusions  - Left ventricle: The cavity size was normal. Wall thickness was   increased in a pattern of mild LVH. Systolic function was normal.   The estimated ejection fraction was in the range of 50% to 55%.   Mild hypokinesis of the anteroseptal myocardium. Features are   consistent with a pseudonormal left ventricular filling pattern,   with concomitant abnormal relaxation and increased  filling   pressure (grade 2 diastolic dysfunction). Doppler parameters are   consistent with high ventricular filling pressure. -  Aortic valve: Transvalvular velocity was within the normal range.   There was no stenosis. There was no regurgitation. - Mitral valve: Transvalvular velocity was within the normal range.   There was no evidence for stenosis. There was trivial   regurgitation. - Left atrium: The atrium was severely dilated. - Right ventricle: The cavity size was normal. Wall thickness was   normal. Systolic function was normal. - Atrial septum: No defect or patent foramen ovale was identified   by color flow Doppler. - Tricuspid valve: There was mild regurgitation. - Pulmonary arteries: Systolic pressure was moderately to severely    increased. PA peak pressure: 59 mm Hg (S  Antimicrobials:   Stopped doxy and cefepime 3/27   Subjective:  Awake alert in nad No distress  No fever Some pain with cough in RLQ no reboudn no diarr no sputum no fever no chills no rash No cp currently  Objective: Vitals:   05/13/17 0400 05/13/17 0500 05/13/17 0700 05/13/17 0714  BP:   (!) 124/57   Pulse: (!) 50 (!) 48 (!) 51   Resp: 19 19 19    Temp: 97.9 F (36.6 C)   (!) 97.3 F (36.3 C)  TempSrc: Oral   Oral  SpO2: 97% 98% 94%   Weight:  117 kg (257 lb 15 oz)    Height:        Intake/Output Summary (Last 24 hours) at 05/13/2017 0736 Last data filed at 05/13/2017 0548 Gross per 24 hour  Intake 842 ml  Output 1450 ml  Net -608 ml   Filed Weights   05/11/17 0459 05/12/17 0600 05/13/17 0500  Weight: 119 kg (262 lb 4.8 oz) 117.5 kg (259 lb) 117 kg (257 lb 15 oz)    Examination: Obese pleasant alert in nad-no jvd, no bruit bilat Thick neck, no thyroimegally s1 s 2no m/r/g-in Sinus brady on monitors cta b no adde dsound no rales Grade 2 -3 LE edema--skin without dishydrotic changes Neuro intact    Data Reviewed: I have personally reviewed following labs and imaging  studies  CBC: Recent Labs  Lab 05/09/17 0352 05/10/17 0259 05/11/17 0321 05/12/17 0309 05/13/17 0352  WBC 8.4 7.1 7.0 6.6 7.2  NEUTROABS 6.0 4.9 4.6 4.5 4.7  HGB 10.7* 9.6* 10.0* 9.6* 11.0*  HCT 31.6* 28.5* 29.3* 28.4* 32.5*  MCV 96.6 95.3 93.9 93.7 94.8  PLT 304 338 402* 430* 956*   Basic Metabolic Panel: Recent Labs  Lab 05/09/17 0352 05/10/17 0259 05/11/17 1109 05/12/17 0309 05/13/17 0352  NA 132* 133* 131* 133* 133*  K 4.4 4.2 4.5 3.7 4.0  CL 94* 95* 93* 92* 91*  CO2 24 24 19* 27 25  GLUCOSE 135* 124* 158* 137* 119*  BUN 80* 91* 106* 107* 114*  CREATININE 3.81* 4.13* 4.30* 4.36* 4.72*  CALCIUM 9.4 9.6 9.8 9.5 9.9   GFR: Estimated Creatinine Clearance: 17.1 mL/min (A) (by C-G formula based on SCr of 4.72 mg/dL (H)). Liver Function Tests: Recent Labs  Lab 05/11/17 1432  ALBUMIN 3.2*   No results for input(s): LIPASE, AMYLASE in the last 168 hours. No results for input(s): AMMONIA in the last 168 hours. Coagulation Profile: No results for input(s): INR, PROTIME in the last 168 hours. Cardiac Enzymes: Recent Labs  Lab 05/07/17 1542 05/07/17 2202 05/08/17 0833 05/08/17 1520 05/08/17 1833  TROPONINI 4.22* 4.49* 3.32* 2.62* 2.50*   CBG: No results for input(s): GLUCAP in the last 168 hours. Urine analysis: No results found for: COLORURINE, APPEARANCEUR, [place], [place], [place],  HGBUR, BILIRUBINUR, KETONESUR, [place], [place], NITRITE, LEUKOCYTESUR   Radiology Studies: Reviewed images personally in health database    Scheduled Meds: . allopurinol  100 mg Oral Daily  . amiodarone  200 mg Oral Daily  . amLODipine  10 mg Oral Daily  . aspirin EC  81 mg Oral QHS  . brimonidine  1 drop Both Eyes QODAY  . chlorhexidine  15 mL Mouth Rinse BID  . clopidogrel  75 mg Oral Daily  . doxazosin  4 mg Oral Daily  . doxycycline  100 mg Oral Q12H  . enoxaparin (LOVENOX) injection  30 mg Subcutaneous Q24H  . ezetimibe  10 mg Oral Daily  . fenofibrate   160 mg Oral Daily  . FLUoxetine  20 mg Oral Daily  . hydrALAZINE  75 mg Oral BID  . isosorbide mononitrate  120 mg Oral Daily  . mouth rinse  15 mL Mouth Rinse q12n4p  . Melatonin  3 mg Oral QHS  . metoprolol tartrate  25 mg Oral BID  . polyethylene glycol  17 g Oral BID  . ranolazine  1,000 mg Oral BID  . senna-docusate  1 tablet Oral BID  . sodium chloride flush  3 mL Intravenous Q12H   Continuous Infusions: . sodium chloride    . furosemide 120 mg (05/13/17 0548)     LOS: 12 days    Time spent: [place], MD [facility]   If 7PM-7AM, please contact night-coverage [email or web address] Password [facility] 05/13/2017, 7:36 AM

## 2017-05-13 NOTE — Progress Notes (Signed)
[facility] Progress Note   72 y.o.year-old with hx of CAD hx CABG 2000 and redo 2011, sp L CEA, CKD stage IV,obesity , OSA on CPAP admitted on 05/01/17 with c/o L sided chest pain. Was admitted to SDU for unstable angina. Seen by cardiology, hx complex anatomy, CABG and redo CABG, they recommended medical Rx w IV NTG and heparin. Trop peaked at 4.49  EF of 50-55%, Grade 2 DD   Creat was 2.8 on admission and is now up to 4.1   Great diuresis with a weight that has decreased to 259 lbs from 273  IV lasix 120mg  q 8hours  He developed flu and pneumonia and is being treated with antibiotics - cefepime and doxyclycline  kidney doctor in [facility], he is up visting family here with his wife. His wife is a dialysis patient. Appreciate help from Dr Donnetta Hutching    Assessment/ Plan:    1 Acute on CKD IV - suspect due to decompCHF. Looks better today, getting OOB. Seems to be diuresing well. Suspect renal function will level off soon. No indication for RRT. Renal ultrasound simple cysts - hopefully we can avoid initiating dialysis during this hospitalization; no absolute need for HD today. - continue IV Lasix for now -> may convert over to PO in the next 48-72hrs; still has significant edema and dyspnea. 2CKD stage IV - baseline creat 2.8 - 3.5 per family, f/b nephrology in [facility]. Get UA 3 Chest pain / unstable angina - on admit 3/15, better now 4 HTN - on 4 bp meds here as at home, may need to reduce if pt starts to diurese 5 CAD hx CABG 2000/ redo CABG 2011 - EF 50-55 % by echo here 6 HL 7. Anemia  Hemoglobin 10 Ts[place] 20%    Subjective:   Still dyspneic but markedly improved from admission. Denies any f/c/n/v overnight. Diuresing nicely with [phone] = neg 608 / 24hrs still on Lasix 120mg  IV q8hrs    Objective:   BP (!) 124/57   Pulse (!) 51   Temp (!) 97.3 F (36.3 C) (Oral)   Resp 19   Ht 5\' 7"  (1.702 m)   Wt 117 kg (257 lb 15 oz)   SpO2 94%   BMI 40.40 kg/m    Intake/Output Summary (Last 24 hours) at 05/13/2017 0817 Last data filed at 05/13/2017 0548 Gross per 24 hour  Intake 602 ml  Output 1450 ml  Net -848 ml   Weight change: -0.482 kg (-1 lb 1 oz)  Physical Exam: GEN - NAD, very pleasasnt CVS- RRR RS- CTA ABD- BS present soft non-distended EXT- tr - 1+  edema    Imaging: US Renal  Result Date: 05/11/2017 CLINICAL DATA:  Chronic renal failure. EXAM: RENAL / URINARY TRACT ULTRASOUND COMPLETE COMPARISON:  None. FINDINGS: Right Kidney: Length: 9.3 cm. Two simple cysts are noted, with the largest measuring 10.8 cm. Echogenicity within normal limits. No mass or hydronephrosis visualized. Left Kidney: Length: 11.9 cm. Two simple cysts are noted, with the largest measuring 3.6 cm. Echogenicity within normal limits. No mass or hydronephrosis visualized. Bladder: Appears normal for degree of bladder distention. IMPRESSION: Bilateral simple renal cysts. No other significant renal abnormality seen. Electronically Signed   By: Marijo Conception, M.D.   On: 05/11/2017 15:43    Labs: BMET Recent Labs  Lab 05/07/17 0304 05/08/17 0259 05/09/17 0352 05/10/17 0259 05/11/17 1109 05/12/17 0309 05/13/17 0352  NA 134* 135 132* 133* 131* 133* 133*  K 4.2  4.5 4.4 4.2 4.5 3.7 4.0  CL 100* 99* 94* 95* 93* 92* 91*  CO2 22 21* 24 24 19* 27 25  GLUCOSE 196* 127* 135* 124* 158* 137* 119*  BUN 63* 72* 80* 91* 106* 107* 114*  CREATININE 2.94* 3.22* 3.81* 4.13* 4.30* 4.36* 4.72*  CALCIUM 8.9 9.2 9.4 9.6 9.8 9.5 9.9   CBC Recent Labs  Lab 05/10/17 0259 05/11/17 0321 05/12/17 0309 05/13/17 0352  WBC 7.1 7.0 6.6 7.2  NEUTROABS 4.9 4.6 4.5 4.7  HGB 9.6* 10.0* 9.6* 11.0*  HCT 28.5* 29.3* 28.4* 32.5*  MCV 95.3 93.9 93.7 94.8  PLT 338 402* 430* 540*    Medications:    . allopurinol  100 mg Oral Daily  . amiodarone  200 mg Oral Daily  . amLODipine  10 mg Oral Daily  . aspirin EC  81 mg Oral QHS  . brimonidine  1 drop Both Eyes QODAY  .  chlorhexidine  15 mL Mouth Rinse BID  . clopidogrel  75 mg Oral Daily  . doxazosin  4 mg Oral Daily  . doxycycline  100 mg Oral Q12H  . enoxaparin (LOVENOX) injection  30 mg Subcutaneous Q24H  . ezetimibe  10 mg Oral Daily  . fenofibrate  160 mg Oral Daily  . FLUoxetine  20 mg Oral Daily  . hydrALAZINE  75 mg Oral BID  . isosorbide mononitrate  120 mg Oral Daily  . mouth rinse  15 mL Mouth Rinse q12n4p  . Melatonin  3 mg Oral QHS  . metoprolol tartrate  25 mg Oral BID  . polyethylene glycol  17 g Oral BID  . ranolazine  1,000 mg Oral BID  . senna-docusate  1 tablet Oral BID  . sodium chloride flush  3 mL Intravenous Q12H      Otelia Santee, MD 05/13/2017, 8:17 AM

## 2017-05-13 NOTE — Progress Notes (Addendum)
The patient has been seen in conjunction with Kathi Ludwig, MD. All aspects of care have been considered and discussed. The patient has been personally interviewed, examined, and all clinical data has been reviewed.   Angina remains markedly improved.  The beta-blocker dose was decreased by 50% on yesterday because of bradycardia.  Today Ranexa dose will be decreased to 500 mg twice daily because of kidney failure and unknown renal clearance of her nose vein.  Kidney function worsens, and it appears patient may require dialysis.  Significant improvement in anginal grade is related to decreased volume, decreased LV cavity size and decrease wall stress.  The diuresis has made a dramatic difference.  Needs to ambulate with cardiac rehab.  From cardiology standpoint the patient could be transferred out of the unit.  Paroxysmal atrial fibrillation probably related to acute illness.  Continue amiodarone therapy.  If recurrent AF will need anticoagulation therapy.  Once clinically stable, discontinuation of amiodarone may be possible.  Progress Note  Patient Name: Nathan Wolfe Date of Encounter: 05/13/2017  Primary Cardiologist: Quay Burow, MD   Subjective   Patient continues to deny chest pain times 4-5 days now.  His dyspnea is also resolved.  He is able to ambulate with minimal oxygen support.  Patient is resting comfortably on 2 L nasal cannula saturating above 97% upon entering the room.  She was decreased oxygen was discontinued at rest.  Patient denied dyspnea has been but continues to be concerned with right lower quadrant external abdominal pain.  Inpatient Medications    Scheduled Meds: . allopurinol  100 mg Oral Daily  . amiodarone  200 mg Oral Daily  . amLODipine  10 mg Oral Daily  . aspirin EC  81 mg Oral QHS  . brimonidine  1 drop Both Eyes QODAY  . chlorhexidine  15 mL Mouth Rinse BID  . clopidogrel  75 mg Oral Daily  . doxazosin  4 mg Oral Daily  .  doxycycline  100 mg Oral Q12H  . enoxaparin (LOVENOX) injection  30 mg Subcutaneous Q24H  . ezetimibe  10 mg Oral Daily  . fenofibrate  160 mg Oral Daily  . FLUoxetine  20 mg Oral Daily  . hydrALAZINE  75 mg Oral BID  . isosorbide mononitrate  120 mg Oral Daily  . mouth rinse  15 mL Mouth Rinse q12n4p  . Melatonin  3 mg Oral QHS  . metoprolol tartrate  25 mg Oral BID  . polyethylene glycol  17 g Oral BID  . ranolazine  1,000 mg Oral BID  . senna-docusate  1 tablet Oral BID  . sodium chloride flush  3 mL Intravenous Q12H   Continuous Infusions: . sodium chloride    . furosemide 120 mg (05/13/17 0548)   PRN Meds: sodium chloride, acetaminophen, ALPRAZolam, benzonatate, diphenhydrAMINE, gi cocktail, hydrALAZINE, ipratropium-albuterol, nitroGLYCERIN, ondansetron (ZOFRAN) IV, sodium chloride flush   Vital Signs    Vitals:   05/13/17 0400 05/13/17 0500 05/13/17 0700 05/13/17 0714  BP:   (!) 124/57   Pulse: (!) 50 (!) 48 (!) 51   Resp: 19 19 19    Temp: 97.9 F (36.6 C)   (!) 97.3 F (36.3 C)  TempSrc: Oral   Oral  SpO2: 97% 98% 94%   Weight:  117 kg (257 lb 15 oz)    Height:        Intake/Output Summary (Last 24 hours) at 05/13/2017 1038 Last data filed at 05/13/2017 0548 Gross per 24 hour  Intake 602 ml  Output 1450 ml  Net -848 ml   Filed Weights   05/11/17 0459 05/12/17 0600 05/13/17 0500  Weight: 119 kg (262 lb 4.8 oz) 117.5 kg (259 lb) 117 kg (257 lb 15 oz)    Telemetry    Normal sinus rhythm- Personally Reviewed  ECG    No recent EKGs- Personally Reviewed  Physical Exam   GEN: No acute distress.  Alert and oriented x3 conversing easily Neck: No JVD Cardiac: RRR, no murmurs, rubs, or gallops.  Respiratory: Clear to auscultation bilaterally. GI: Soft, nontender, non-distended.  There is no overt bulging or hernia visible or palpable the area of the pain MS:  Bilateral lower extremity edema continues to improve less than +1; No deformity. Neuro:  Nonfocal    Psych: Normal affect   Labs    Chemistry Recent Labs  Lab 05/11/17 1109 05/11/17 1432 05/12/17 0309 05/13/17 0352  NA 131*  --  133* 133*  K 4.5  --  3.7 4.0  CL 93*  --  92* 91*  CO2 19*  --  27 25  GLUCOSE 158*  --  137* 119*  BUN 106*  --  107* 114*  CREATININE 4.30*  --  4.36* 4.72*  CALCIUM 9.8  --  9.5 9.9  ALBUMIN  --  3.2*  --   --   GFRNONAA 12*  --  12* 11*  GFRAA 14*  --  14* 13*  ANIONGAP 19*  --  14 17*     Hematology Recent Labs  Lab 05/11/17 0321 05/12/17 0309 05/13/17 0352  WBC 7.0 6.6 7.2  RBC 3.12* 3.03* 3.43*  HGB 10.0* 9.6* 11.0*  HCT 29.3* 28.4* 32.5*  MCV 93.9 93.7 94.8  MCH 32.1 31.7 32.1  MCHC 34.1 33.8 33.8  RDW 14.1 13.9 13.9  PLT 402* 430* 540*    Cardiac Enzymes Recent Labs  Lab 05/07/17 2202 05/08/17 0833 05/08/17 1520 05/08/17 1833  TROPONINI 4.49* 3.32* 2.62* 2.50*   No results for input(s): TROPIPOC in the last 168 hours.   BNPNo results for input(s): BNP, PROBNP in the last 168 hours.   DDimer No results for input(s): DDIMER in the last 168 hours.   Radiology    US Renal  Result Date: 05/11/2017 CLINICAL DATA:  Chronic renal failure. EXAM: RENAL / URINARY TRACT ULTRASOUND COMPLETE COMPARISON:  None. FINDINGS: Right Kidney: Length: 9.3 cm. Two simple cysts are noted, with the largest measuring 10.8 cm. Echogenicity within normal limits. No mass or hydronephrosis visualized. Left Kidney: Length: 11.9 cm. Two simple cysts are noted, with the largest measuring 3.6 cm. Echogenicity within normal limits. No mass or hydronephrosis visualized. Bladder: Appears normal for degree of bladder distention. IMPRESSION: Bilateral simple renal cysts. No other significant renal abnormality seen. Electronically Signed   By: Marijo Conception, M.D.   On: 05/11/2017 15:43    Cardiac Studies    Echo3/17/19: Study Conclusions  - Left ventricle: The cavity size was normal. Wall thickness was increased in a pattern of mild LVH.  Systolic function was normal. The estimated ejection fraction was in the range of 50% to 55%. Mild hypokinesis of the anteroseptal myocardium. Features are consistent with a pseudonormal left ventricular filling pattern, with concomitant abnormal relaxation and increased filling pressure (grade 2 diastolic dysfunction). Doppler parameters are consistent with high ventricular filling pressure. - Aortic valve: Transvalvular velocity was within the normal range. There was no stenosis. There was no regurgitation. - Mitral valve: Transvalvular velocity was within the normal range. There  was no evidence for stenosis. There was trivial regurgitation. - Left atrium: The atrium was severely dilated. - Right ventricle: The cavity size was normal. Wall thickness was normal. Systolic function was normal. - Atrial septum: No defect or patent foramen ovale was identified by color flow Doppler. - Tricuspid valve: There was mild regurgitation. - Pulmonary arteries: Systolic pressure was moderately to severely increased. PA peak pressure: 59 mm Hg (S).  Cardiac cath in March 2016 as per chart review from outside facility. Last catheterization was March 2016."His EFwasnormal. He has a patent LIMA to the LAD with an occluded LAD beyond LIMA insertion. He has a patent vein graft to the right status post stenting of the PDA with a 2.5 mm Synergy drug-eluting stent to the vein graft and a diseased ramus branch was with not intervened on."  Patient Profile     73 y.o. male with extensive CAD history and previous redo bypass surgery in 2000, 2011 multiple subsequent drug-eluting stents including SVG to the PDA 2016, with known occlusion of the LAD beyond the LIMA bypass. There is also a severely diseased ramus intermedius branch not amenable to further revascularization attempts as per the patient's primary cardiologist. He was admitted for chest pain with elevated troponins but no  corresponding EKG changes most likely secondary to demand ischemia from influenza pneumonia and heart failure exacerbation with underlying worsening of his chronic kidney disease.  He was followed by nephrology for worsening renal function who recommended continuing aggressive IV diuresis and consultation with vascular for impending dialysis needs with vein mapping and possible fistula placement.  Assessment & Plan    CAD s/p NSTEMI: Chest pain absent approximately 4-5 days now.  Given the resolution of the patient's influenza pneumonia and decreased volume improvement in his chest pain we have begun titrating his antianginal medications. Decrease Ranexa to 500mg  BID due to bradycardia Continue aspirin clopidogrel Continue Imdur 20 mg daily Continue NTG as needed chest pain Discontinue Zetia, will discuss low dose statin trial with the patient  Atrial fibrillation: Brief episode, has not reoccurred.  We still do not feel triple therapy is advisable in this patient and will continue aspirin Plavix with adequate rate control. Continue amiodarone 200 mg daily for rhythm control. Continue with metoprolol 25 mg BID  Progressively worsening CKD: Nephrology following.  But the patient to consider hemodialysis in the near future.  Pain Naiping was completed yesterday and vascular follow-up was a decision is made but the family to pursue hemodialysis.  Patient's BUN and creatinine continue to worsen today significantly.  Congestive heart failure: Echo with LVEF of 50-55% with preserved left ventricular systolic function and grossly dilated left atrium.  Continue aggressive diuresis as per nephrology.  Continue to space patient will undoubtedly need dialysis in the near future recommend discussion of this with the patient.  It appears at this time the patient is amenable to potential for dialysis and wishes to move forward his weight has decreased 5 pounds from admission to 17 pounds since his  peak.  Acute on respiratory insufficiency w/ hypoxia: Patients baseline SpO2 unclear but requiring O2 with ambulation but not with rest. We will continue to ambulate. He has progressed significantly from a cardiac standpoint and believe he is ready to progress to more aggressive rehab as per his other teams.    HTN: Continue amlodipine 10mg  daily in addition to CAD/CHF medications.   Other medical issues as per primary team.   For questions or updates, please contact [place] Please  consult [email or web address] for contact info under Cardiology/STEMI.   Please see attending note/attestation for current A/P.    Signed, Kathi Ludwig, MD  05/13/2017, 10:38 AM

## 2017-05-14 LAB — CBC WITH DIFFERENTIAL/PLATELET
BASOS ABS: 0 10*3/uL (ref 0.0–0.1)
Basophils Relative: 0 %
EOS PCT: 6 %
Eosinophils Absolute: 0.4 10*3/uL (ref 0.0–0.7)
HEMATOCRIT: 30.2 % — AB (ref 39.0–52.0)
HEMOGLOBIN: 10.2 g/dL — AB (ref 13.0–17.0)
LYMPHS PCT: 13 %
Lymphs Abs: 0.9 10*3/uL (ref 0.7–4.0)
MCH: 32 pg (ref 26.0–34.0)
MCHC: 33.8 g/dL (ref 30.0–36.0)
MCV: 94.7 fL (ref 78.0–100.0)
Monocytes Absolute: 1 10*3/uL (ref 0.1–1.0)
Monocytes Relative: 15 %
NEUTROS ABS: 4.5 10*3/uL (ref 1.7–7.7)
NEUTROS PCT: 66 %
PLATELETS: 478 10*3/uL — AB (ref 150–400)
RBC: 3.19 MIL/uL — AB (ref 4.22–5.81)
RDW: 13.8 % (ref 11.5–15.5)
WBC: 6.8 10*3/uL (ref 4.0–10.5)

## 2017-05-14 LAB — BASIC METABOLIC PANEL
ANION GAP: 17 — AB (ref 5–15)
BUN: 120 mg/dL — ABNORMAL HIGH (ref 6–20)
CO2: 26 mmol/L (ref 22–32)
Calcium: 9.7 mg/dL (ref 8.9–10.3)
Chloride: 91 mmol/L — ABNORMAL LOW (ref 101–111)
Creatinine, Ser: 4.8 mg/dL — ABNORMAL HIGH (ref 0.61–1.24)
GFR calc Af Amer: 13 mL/min — ABNORMAL LOW (ref >60)
GFR, EST NON AFRICAN AMERICAN: 11 mL/min — AB (ref >60)
GLUCOSE: 109 mg/dL — AB (ref 65–99)
POTASSIUM: 4 mmol/L (ref 3.5–5.1)
Sodium: 134 mmol/L — ABNORMAL LOW (ref 135–145)

## 2017-05-14 MED ORDER — LIDOCAINE HCL 2 % EX GEL: 1.0000 "application " | Freq: Once | CUTANEOUS | Status: DC

## 2017-05-14 MED ORDER — EZETIMIBE 10 MG PO TABS
10.0000 mg | ORAL_TABLET | Freq: Every day | ORAL | Status: DC
  Administered 2017-05-14 – 2017-05-18 (×5): 10 mg via ORAL
  Filled 2017-05-14 (×5): qty 1

## 2017-05-14 MED ORDER — LIDOCAINE HCL 2 % EX GEL
1.0000 "application " | Freq: Once | CUTANEOUS | Status: DC
  Filled 2017-05-14: qty 20

## 2017-05-14 MED ORDER — CEFAZOLIN SODIUM-DEXTROSE 2-4 GM/100ML-% IV SOLN
2.0000 g | INTRAVENOUS | Status: AC
  Administered 2017-05-15: 2 g via INTRAVENOUS
  Filled 2017-05-14 (×2): qty 100

## 2017-05-14 MED ORDER — CEFAZOLIN SODIUM-DEXTROSE 2-4 GM/100ML-% IV SOLN
2.0000 g | INTRAVENOUS | Status: DC
  Filled 2017-05-14: qty 100

## 2017-05-14 MED ORDER — LIDOCAINE HCL 2 % EX GEL
1.0000 "application " | Freq: Once | CUTANEOUS | Status: AC
  Administered 2017-05-14: 1 via URETHRAL
  Filled 2017-05-14: qty 5

## 2017-05-14 NOTE — Progress Notes (Signed)
Pt wearing home CPAP unit and tolerating well.  Pt placing CPAP himself.  No issues noted.

## 2017-05-14 NOTE — Progress Notes (Signed)
Physical Therapy Treatment & Discharge Patient Details Name: Nathan Wolfe MRN: [id number] DOB: 1945-10-17 Today's Date: 05/14/2017    History of Present Illness Pt is a 72 y.o. male admitted 04/30/08 with chest pain not relieved by NG; worked up for STEMI with elevated troponins likely secondary to demand ischemia from influenza pneumonia and HF exacerbation with underlying worsening CKD. Worsening kidney function and may require HD. Tentatively scheduled him for RUE AV fistula on 3/29. 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    PT Comments    Pt has progressed well with mobility. Able to amb 550' with RW and supervision for safety; intermittent standing rest breaks secondary to lower back discomfort. SpO2 >90% on RA throughout. From a mobility perspective, feel pt is safe to return to daughter's home with HHPT services and use of RW. Recommend pt continued ambulating with daughter and/or nursing staff. All education completed and questions answered. Recommend follow-up with OP PT upon return to [facility] if lower back pain persists. Will d/c acute PT.   Follow Up Recommendations  Home health PT;Supervision/Assistance - 24 hour     Equipment Recommendations  Rolling walker with 5" wheels    Recommendations for Other Services       Precautions / Restrictions Precautions Precautions: Fall Restrictions Weight Bearing Restrictions: No    Mobility  Bed Mobility Overal bed mobility: Modified Independent             General bed mobility comments: Requested daughter not to attempt. Pt able to perform bed mob mod indep with increased time and effort; educ on technique versus trying to power up with core  Transfers Overall transfer level: Modified independent Equipment used: Rolling walker (2 wheeled) Transfers: Sit to/from Stand Sit to Stand: Modified independent  (Device/Increase time)            Ambulation/Gait Ambulation/Gait assistance: Supervision;Min guard Ambulation Distance (Feet): 550 Feet Assistive device: Rolling walker (2 wheeled) Gait Pattern/deviations: Step-through pattern;Decreased stride length;Trunk flexed Gait velocity: Decreased Gait velocity interpretation: <1.8 ft/sec, indicative of risk for recurrent falls General Gait Details: Min guard amb in room with no DME. Supervision amb 550' with RW   Stairs Stairs: (Educ on technique for ascending steps with rail; rest break as needed)          Wheelchair Mobility    Modified Rankin (Stroke Patients Only)       Balance Overall balance assessment: Needs assistance Sitting-balance support: Feet supported;No upper extremity supported Sitting balance-[name]: Good     Standing balance support: During functional activity;Single extremity supported Standing balance-[name]: Fair Standing balance comment: Can static stand with no UE support; dynamic stability improved with BUE support                            Cognition Arousal/Alertness: Awake/alert Behavior During Therapy: WFL for tasks assessed/performed Overall Cognitive Status: Within Functional Limits for tasks assessed                                 General Comments: H/o depression/anxiety per daughter. WFL this session      Exercises      General Comments General comments (skin integrity, edema, etc.): Daughter present throughout session. SpO2 >92% on RA      Pertinent Vitals/Pain Pain  Assessment: No/denies pain    Home Living                      Prior Function            PT Goals (current goals can now be found in the care plan section) Progress towards PT goals: Goals met/education completed, patient discharged from PT    Frequency    Min 3X/week      PT Plan Current plan remains appropriate    Co-evaluation               AM-PAC PT "6 Clicks" Daily Activity  Outcome Measure  Difficulty turning over in bed (including adjusting bedclothes, sheets and blankets)?: None Difficulty moving from lying on back to sitting on the side of the bed? : None Difficulty sitting down on and standing up from a chair with arms (e.g., wheelchair, bedside commode, etc,.)?: None Help needed moving to and from a bed to chair (including a wheelchair)?: A Little Help needed walking in hospital room?: A Little Help needed climbing 3-5 steps with a railing? : A Little 6 Click Score: 21    End of Session Equipment Utilized During Treatment: Gait belt Activity Tolerance: Patient tolerated treatment well Patient left: in bed;with call bell/phone within reach;with family/visitor present Nurse Communication: Mobility status PT Visit Diagnosis: Unsteadiness on feet (R26.81);Difficulty in walking, not elsewhere classified (R26.2)     Time: 1110-1130 PT Time Calculation (min) (ACUTE ONLY): 20 min  Charges:  $Therapeutic Activity: 8-22 mins                    G Codes:      Mabeline Caras, PT, DPT Acute Rehab Services  Pager: [place] 05/14/2017, 12:15 PM

## 2017-05-14 NOTE — Progress Notes (Signed)
Patient voided 200 cc amber urine. Bladder scan completed. First attempt 833 cc. Second attempt, >999 cc.

## 2017-05-14 NOTE — Progress Notes (Addendum)
The patient has been seen in conjunction with Kathi Ludwig, MD. All aspects of care have been considered and discussed. The patient has been personally interviewed, examined, and all clinical data has been reviewed.   Currently stable without recurrent angina.  Heart failure symptoms are markedly improved with aggressive diuresis.  Acute on chronic kidney disease stage V.  No further cardiac medication adjustments.  In fact we have been able to decrease intensity of beta-blocker therapy and were no losing.  Diuresis has helped both heart failure and ischemic symptoms by decreasing LV wall tension.  Progress Note  Patient Name: Nathan Wolfe Date of Encounter: 05/14/2017  Primary Cardiologist: Quay Burow, MD   Subjective   The patient was sitting up in a chair to the side of the bed today upon entering the room he denied concerns. His dyspnea has dramatically improved and continues to do so. His chest pain have resolved.  Inpatient Medications    Scheduled Meds: . allopurinol  100 mg Oral Daily  . amiodarone  200 mg Oral Daily  . amLODipine  10 mg Oral Daily  . aspirin EC  81 mg Oral QHS  . brimonidine  1 drop Both Eyes QODAY  . chlorhexidine  15 mL Mouth Rinse BID  . clopidogrel  75 mg Oral Daily  . doxazosin  4 mg Oral Daily  . enoxaparin (LOVENOX) injection  30 mg Subcutaneous Q24H  . FLUoxetine  20 mg Oral Daily  . hydrALAZINE  75 mg Oral BID  . isosorbide mononitrate  120 mg Oral Daily  . mouth rinse  15 mL Mouth Rinse q12n4p  . Melatonin  3 mg Oral QHS  . metoprolol tartrate  25 mg Oral BID  . polyethylene glycol  17 g Oral BID  . ranolazine  500 mg Oral BID  . senna-docusate  1 tablet Oral BID   Continuous Infusions: . furosemide 120 mg (05/14/17 0634)   PRN Meds: acetaminophen, ALPRAZolam, benzonatate, diphenhydrAMINE, gi cocktail, hydrALAZINE, ipratropium-albuterol, nitroGLYCERIN, ondansetron (ZOFRAN) IV   Vital Signs    Vitals:   05/14/17  0300 05/14/17 0400 05/14/17 0500 05/14/17 0600  BP:    (!) 116/50  Pulse: (!) 48 (!) 46 (!) 49 (!) 53  Resp: 18 16 (!) 26 (!) 24  Temp:  97.6 F (36.4 C)    TempSrc:  Oral    SpO2: 98% 97% 95% 93%  Weight:   116.3 kg (256 lb 6.3 oz)   Height:        Intake/Output Summary (Last 24 hours) at 05/14/2017 0647 Last data filed at 05/13/2017 2200 Gross per 24 hour  Intake 832 ml  Output 1250 ml  Net -418 ml   Filed Weights   05/12/17 0600 05/13/17 0500 05/14/17 0500  Weight: 117.5 kg (259 lb) 117 kg (257 lb 15 oz) 116.3 kg (256 lb 6.3 oz)    Telemetry    NSR - Personally Reviewed  ECG    No recent tracings - Personally Reviewed  Physical Exam   GEN: No acute distress.  Alert, Conversant. Discussing his personal care. Neck: No JVD Cardiac: RRR, no murmurs, rubs, or gallops.  Respiratory: Clear to auscultation bilaterally. GI: Soft, nontender, non-distended  MS: Mild nonpitting edema, improved from prior day, No deformity. Neuro:  Nonfocal  Psych: Normal affect   Labs    Chemistry Recent Labs  Lab 05/11/17 1432 05/12/17 0309 05/13/17 0352 05/14/17 0241  NA  --  133* 133* 134*  K  --  3.7 4.0  4.0  CL  --  92* 91* 91*  CO2  --  27 25 26   GLUCOSE  --  137* 119* 109*  BUN  --  107* 114* 120*  CREATININE  --  4.36* 4.72* 4.80*  CALCIUM  --  9.5 9.9 9.7  ALBUMIN 3.2*  --   --   --   GFRNONAA  --  12* 11* 11*  GFRAA  --  14* 13* 13*  ANIONGAP  --  14 17* 17*     Hematology Recent Labs  Lab 05/12/17 0309 05/13/17 0352 05/14/17 0241  WBC 6.6 7.2 6.8  RBC 3.03* 3.43* 3.19*  HGB 9.6* 11.0* 10.2*  HCT 28.4* 32.5* 30.2*  MCV 93.7 94.8 94.7  MCH 31.7 32.1 32.0  MCHC 33.8 33.8 33.8  RDW 13.9 13.9 13.8  PLT 430* 540* 478*    Cardiac Enzymes Recent Labs  Lab 05/07/17 2202 05/08/17 0833 05/08/17 1520 05/08/17 1833  TROPONINI 4.49* 3.32* 2.62* 2.50*   No results for input(s): TROPIPOC in the last 168 hours.   BNPNo results for input(s): BNP, PROBNP in  the last 168 hours.   DDimer No results for input(s): DDIMER in the last 168 hours.   Radiology    No results found.  Cardiac Studies   Echo3/17/19: Study Conclusions  - Left ventricle: The cavity size was normal. Wall thickness was increased in a pattern of mild LVH. Systolic function was normal. The estimated ejection fraction was in the range of 50% to 55%. Mild hypokinesis of the anteroseptal myocardium. Features are consistent with a pseudonormal left ventricular filling pattern, with concomitant abnormal relaxation and increased filling pressure (grade 2 diastolic dysfunction). Doppler parameters are consistent with high ventricular filling pressure. - Aortic valve: Transvalvular velocity was within the normal range. There was no stenosis. There was no regurgitation. - Mitral valve: Transvalvular velocity was within the normal range. There was no evidence for stenosis. There was trivial regurgitation. - Left atrium: The atrium was severely dilated. - Right ventricle: The cavity size was normal. Wall thickness was normal. Systolic function was normal. - Atrial septum: No defect or patent foramen ovale was identified by color flow Doppler. - Tricuspid valve: There was mild regurgitation. - Pulmonary arteries: Systolic pressure was moderately to severely increased. PA peak pressure: 59 mm Hg (S).  Cardiac cath in March 2016 as per chart review from outside facility. Last catheterization was March 2016."His EFwasnormal. He has a patent LIMA to the LAD with an occluded LAD beyond LIMA insertion. He has a patent vein graft to the right status post stenting of the PDA with a 2.5 mm Synergy drug-eluting stent to the vein graft and a diseased ramus branch was with not intervened on."  Patient Profile     72 y.o. male with extensive CAD history and previous redo bypass surgery in 2000, 2011 multiple subsequent drug-eluting stents including SVG to  the PDA 2016, with known occlusion of the LAD beyond the LIMA bypass. There is also a severely diseased ramus intermedius branch not amenable to further revascularization attempts as per the patient's primary cardiologist. He was admitted for chest pain with elevated troponins but no corresponding EKG changes most likely secondary to demand ischemia from influenza pneumonia and heart failure exacerbation with underlying worsening of his chronic kidney disease.  He was followed by nephrology for worsening renal function who recommended continuing aggressive IV diuresis and consultation with vascular for impending dialysis needs with vein mapping and possible fistula placement.  Assessment &  Plan    CAD s/p NSTEMI: Chest pain free. No indication to intervene in the near future. The patient does not feel he is amenable to attempting statin therapy again. He has experienced significant muscle aches and pain secondary to every statin used in the past. He stated that Crestor induced the most severe symptoms but that Lipitor was also concerning. He described severe muscle aches and stiffness secondary to statins. Continue ASA/plavix  Continue IMDUR 120mg  daily Continue NTG daily Discontinue fenofibrate Continue Zetia after discussion with the patient. He is refusing statin therapy at this time due to severe muscle pain.  A-fib: No reoccurrence since the brief event. Recommend proceeding with ASA and clopidogrel as well as adequate rate control. If his A-fib returns, we can reassess the need for anticoagulation.  Continue amiodarone 200mg  daily Continue metoprolol 25mg  BID   Progressively worsening CKD IV: Being followed by nephrology and vascular surgery. Planning for A/V fistula surgery 05/15/2017 as per vascular notation, granted, patient is in agreement which he appeared to be today. Renal function worsening again today. BUN and Cr continue to trend upward.   Congestive heart failure: G2DD on echo.  Edema improving somewhat each day. Nephrology following for diuresis assistance. They are planning on switching to PO lasix most likely in the next 48 hours. Weight down ~6lbs since admission.   Acute on chronic respiratory insufficiency with hypoxia: Dramatically improved following the resolution of his influenza pneumonia. Continues to require minimal O2 with ambulation but not at rest. Will most likely need home O2.  Continue PT  HTN: Continue amlodipine 10mg  daily.  For questions or updates, please contact [place] Please consult [email or web address] for contact info under Cardiology/STEMI.   Please see attending note/attestation for current A/P.  Signed, Kathi Ludwig, MD Internal Medicine, PGY-1 Pager # [phone]

## 2017-05-14 NOTE — H&P (View-Only) (Signed)
Patient ID: Nathan Wolfe, male   DOB: [birth year], 72 y.o.   MRN: [id number] No new cardiac events.  Deal with progressive renal insufficiency.  Creatinine today 4.8.  Discussed with Dr.Lin.  He does not feel that there is any indication for acute hemodialysis therefore will not place a tunneled catheter.  Plan right arm AV fistula creation tomorrow.  Again discussed this at length with the patient and his daughter present who understand

## 2017-05-14 NOTE — Progress Notes (Signed)
Hospitalist progress note   Nathan Wolfe  OZD:[id number] DOB: 05-27-1945 DOA: 04/30/2017 PCP: Patient, No Pcp Per Specialists:  Cardiology Nephrology Vascular surgery  Brief Narrative:   72 year old male originally from [place] with Dr. 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 Diagnoses of Chest pain, Cough, Abnormal CXR, Hypoxia, Pulmonary edema, Respiratory distress, Constipation, and Chronic renal failure were also pertinent to this visit.   Sepsis secondary to flu, possible pneumonia ?-monitor--last temp 102.2 3/20 Blood culture no growth to date and sputum culture gram-positive cocci few gram-negative rods Now has had 12 days of cefepime doxycycline-stopped Abx 3.27 Will not order CT chest-explained to patient high likelihood of complete Rx Bact PNA and understands  obst uropathy causing abd pain- could have been distended bladder?--see nursing noted 3/28 Patient has been gently coaxed byu RN staff to get foley indwelling for the time being I explained this might mitigate worsening creatinine, might have obstructive uropathy  Unstable angina-admitted to medical floor-last cath 04/2014 Kept on aspirin Plavix placed on nitroglycerin infusion cardiology evaluation  felt--high risk of nephrotoxicity with cardiac cath-troponin peak was 4.4-Felt most appropriate for medical management placed on amlodipine ranolazine, metoprolol, long-acting nitrate [place] down to 500 bid per cards On asa 81 daily  New onset paroxysmal atrial flutter fib-cardiology managing placed on amiodarone with some sinus bradycardia so meds adjusted-metoprolol dropped to 25 twice daily-anticoagulation ?recurrence of Afib afib--Dfer to cardiology No changes today  Acute/chronic diastolic heart failure-nephrology consulted given the rising creatinine from admission 63/2.9-->114/4.7, patient is net -1.9 L and weight is down from 123.5-116 currently--nephrology managing diuresis Lasix IV 120 every 8 desat screen shows need for oxygen will repeat once on tele floor-has been more dyspneoic over the past year  AK I superimposed on CKD stage IV-creatinine rising-see above discussion --Vascular has been consulted and vein mapping performed 3/27 for fistula creation dependent on wishes for dialysis versus not Will need further discussion of implications dialysis with Nephro/Vasc  Hypertension-Moderately controlled with some hypotension now resolved-current meds of amiodarone 200 daily, amlodipine 10, hydralazine 75 twice daily, Imdur 120 daily, metoprolol 25 twice daily  Bipolar continue Xanax 0.5 3 times daily as needed, fluoxetine 20 daily  BPH continue Cardura 4 mg daily  OSA on CPAP at night  Anemia of chronic disease  Hyperlipidemia continue statin--discussion per cardiology  DVT prophylaxis: Lovenoex  Code Status:    ful   Family Communication:   D/w daughter extensively Disposition Plan: IP transfer to tele   Consultants:   Cardiology  Nephrology  Renal  Procedures:   Vein mapping 3/26  ECho 3/17-Study Conclusions  - Left ventricle: The cavity size was normal. Wall thickness was   increased in a pattern of mild LVH. Systolic function was normal.   The estimated  ejection fraction was in the range of 50% to 55%.   Mild hypokinesis of the anteroseptal  myocardium. Features are   consistent with a pseudonormal left ventricular filling pattern,   with concomitant abnormal relaxation and increased filling   pressure (grade 2 diastolic dysfunction). Doppler parameters are   consistent with high ventricular filling pressure. - Aortic valve: Transvalvular velocity was within the normal range.   There was no stenosis. There was no regurgitation. - Mitral valve: Transvalvular velocity was within the normal range.   There was no evidence for stenosis. There was trivial   regurgitation. - Left atrium: The atrium was severely dilated. - Right ventricle: The cavity size was normal. Wall thickness was   normal. Systolic function was normal. - Atrial septum: No defect or patent foramen ovale was identified   by color flow Doppler. - Tricuspid valve: There was mild regurgitation. - Pulmonary arteries: Systolic pressure was moderately to severely    increased. PA peak pressure: 59 mm Hg (S  Antimicrobials:   Stopped doxy and cefepime 3/27   Subjective:  Scared of foley cathter In no cp No cough no fever no cold no rash No n nor vomit  Objective: Vitals:   05/14/17 1230 05/14/17 1300 05/14/17 1400 05/14/17 1500  BP: (!) 105/52     Pulse: (!) 53 (!) 49 (!) 49 (!) 54  Resp: 18 17 14  (!) 26  Temp:      TempSrc:      SpO2: 93% 91% 98% 93%  Weight:      Height:        Intake/Output Summary (Last 24 hours) at 05/14/2017 1543 Last data filed at 05/14/2017 1413 Gross per 24 hour  Intake 1654 ml  Output 1350 ml  Net 304 ml   Filed Weights   05/12/17 0600 05/13/17 0500 05/14/17 0500  Weight: 117.5 kg (259 lb) 117 kg (257 lb 15 oz) 116.3 kg (256 lb 6.3 oz)    Examination:  Obese pleasant alert in nad-no jvd, no bruit bilat Thick neck, no SM lymphadenopathy s1 s2 no m/r/g-in Sinus brady on monitors but improved cta b no added sound no  rales Grade 2  LE edema--skin without dishydrotic changes Neuro intact    Data Reviewed: I have personally reviewed following labs and imaging studies  CBC: Recent Labs  Lab 05/10/17 0259 05/11/17 0321 05/12/17 0309 05/13/17 0352 05/14/17 0241  WBC 7.1 7.0 6.6 7.2 6.8  NEUTROABS 4.9 4.6 4.5 4.7 4.5  HGB 9.6* 10.0* 9.6* 11.0* 10.2*  HCT 28.5* 29.3* 28.4* 32.5* 30.2*  MCV 95.3 93.9 93.7 94.8 94.7  PLT 338 402* 430* 540* 751*   Basic Metabolic Panel: Recent Labs  Lab 05/10/17 0259 05/11/17 1109 05/12/17 0309 05/13/17 0352 05/14/17 0241  NA 133* 131* 133* 133* 134*  K 4.2 4.5 3.7 4.0 4.0  CL 95* 93* 92* 91* 91*  CO2 24 19* 27 25 26   GLUCOSE 124* 158* 137* 119* 109*  BUN 91* 106* 107* 114* 120*  CREATININE 4.13* 4.30* 4.36* 4.72* 4.80*  CALCIUM 9.6 9.8 9.5 9.9 9.7   GFR: Estimated Creatinine Clearance: 16.7 mL/min (A) (by C-G formula based on SCr of 4.8 mg/dL (H)). Liver Function Tests: Recent Labs  Lab 05/11/17 1432  ALBUMIN 3.2*   No results for input(s): LIPASE, AMYLASE in the last 168 hours. No results for input(s): AMMONIA in the last 168 hours. Coagulation Profile: No results for input(s): INR, PROTIME in the last 168 hours. Cardiac Enzymes: Recent Labs  Lab 05/07/17 2202 05/08/17 0833 05/08/17 1520 05/08/17 1833  TROPONINI 4.49* 3.32* 2.62* 2.50*   CBG:  No results for input(s): GLUCAP in the last 168 hours. Urine analysis: No results found for: COLORURINE, APPEARANCEUR, LABSPEC, [place], GLUCOSEU, [place], [place], [place], [place], [place], NITRITE, [place]   Radiology Studies: Reviewed images personally in health database    Scheduled Meds: . allopurinol  100 mg Oral Daily  . amiodarone  200 mg Oral Daily  . amLODipine  10 mg Oral Daily  . aspirin EC  81 mg Oral QHS  . brimonidine  1 drop Both Eyes QODAY  . chlorhexidine  15 mL Mouth Rinse BID  . clopidogrel  75 mg Oral Daily  . doxazosin  4 mg Oral Daily  .  enoxaparin (LOVENOX) injection  30 mg Subcutaneous Q24H  . ezetimibe  10 mg Oral Daily  . FLUoxetine  20 mg Oral Daily  . hydrALAZINE  75 mg Oral BID  . isosorbide mononitrate  120 mg Oral Daily  . lidocaine  1 application Urethral Once  . mouth rinse  15 mL Mouth Rinse q12n4p  . Melatonin  3 mg Oral QHS  . metoprolol tartrate  25 mg Oral BID  . polyethylene glycol  17 g Oral BID  . ranolazine  500 mg Oral BID  . senna-docusate  1 tablet Oral BID   Continuous Infusions: . [START ON 05/15/2017]  ceFAZolin (ANCEF) IV    . furosemide Stopped (05/14/17 1524)     LOS: 13 days    Time spent: Sophia, MD [facility]   If 7PM-7AM, please contact night-coverage [email or web address] Password [facility] 05/14/2017, 3:43 PM

## 2017-05-14 NOTE — Progress Notes (Signed)
[facility] Progress Note   72 y.o.year-old with hx of CAD hx CABG 2000 and redo 2011, sp L CEA, CKD stage IV,obesity , OSA on CPAP admitted on 05/01/17 with c/o L sided chest pain. Was admitted to SDU for unstable angina. Seen by cardiology, hx complex anatomy, CABG and redo CABG, they recommended medical Rx w IV NTG and heparin. Trop peaked at 4.49 EF of 50-55%, Grade 2 DD   Creat was 2.8on admission and is now up to 4.1 Great diuresis with a weight that has decreased to 259 lbs from 273 IV lasix 120mg  q 8hours  He developed flu and pneumonia and is being treated with antibiotics - cefepime and doxyclycline  He sees a kidney doctor in [facility], he is up visting family here with his wife. His wife is a dialysis patient.Appreciate help from Dr Donnetta Hutching   Assessment/ Plan:   1 Acute on CKD IV - suspect due to decompCHF. Looks better today, getting OOB. Seems to be diuresing well. Suspect renal function will level off soon. No indication for RRT.Renal ultrasound simple cysts - hopefully we can avoid initiating dialysis during this hospitalization; no absolute need for HD today but if renal function still worse tomorrow I believe a tunneled dialysis catheter would be appropriate. - continue IV Lasix for now -> may convert over to PO in the next 48hrs; still has significant edema and dyspnea. My thought is he will have a tough time regaining residual renal function due to the advanced CKD + CHF. Will plan on initiating dialysis over the weekend unless things change. - Will another PVR bladder scan to be complete but I doubt obstruction is playing a role. 2CKD stage IV - baseline creat 2.8 - 3.5 per family, f/b nephrology in [facility]. Get UA 3 Chest pain / unstable angina - on admit 3/15, better now 4 HTN - on 4 bp meds here as at home, may need to reduce if pt starts to diurese 5 CAD hx CABG 2000/ redo CABG 2011 - EF 50-55 % by echo here 6 HL 7. AnemiaHemoglobin 10  Tsats 20%    Subjective:    Still dyspneic but markedly improved from admission. Denies any f/c/n/v overnight. Diuresing nicely on Lasix 120mg  IV q8hrs   Objective:   BP (!) 116/50   Pulse (!) 53   Temp 97.6 F (36.4 C) (Oral)   Resp (!) 24   Ht 5\' 7"  (1.702 m)   Wt 116.3 kg (256 lb 6.3 oz)   SpO2 93%   BMI 40.16 kg/m   Intake/Output Summary (Last 24 hours) at 05/14/2017 0814 Last data filed at 05/13/2017 2200 Gross per 24 hour  Intake 592 ml  Output 1250 ml  Net -658 ml   Weight change: -0.7 kg (-1 lb 8.7 oz)  Physical Exam: GEN - NAD, very pleasasnt CVS- RRR RS- CTA ABD- BS present soft non-distended EXT- tr - 1+  edema    Imaging: No results found.  Labs: BMET Recent Labs  Lab 05/08/17 0259 05/09/17 0352 05/10/17 0259 05/11/17 1109 05/12/17 0309 05/13/17 0352 05/14/17 0241  NA 135 132* 133* 131* 133* 133* 134*  K 4.5 4.4 4.2 4.5 3.7 4.0 4.0  CL 99* 94* 95* 93* 92* 91* 91*  CO2 21* 24 24 19* 27 25 26   GLUCOSE 127* 135* 124* 158* 137* 119* 109*  BUN 72* 80* 91* 106* 107* 114* 120*  CREATININE 3.22* 3.81* 4.13* 4.30* 4.36* 4.72* 4.80*  CALCIUM 9.2 9.4 9.6 9.8 9.5  9.9 9.7   CBC Recent Labs  Lab 05/11/17 0321 05/12/17 0309 05/13/17 0352 05/14/17 0241  WBC 7.0 6.6 7.2 6.8  NEUTROABS 4.6 4.5 4.7 4.5  HGB 10.0* 9.6* 11.0* 10.2*  HCT 29.3* 28.4* 32.5* 30.2*  MCV 93.9 93.7 94.8 94.7  PLT 402* 430* 540* 478*    Medications:    . allopurinol  100 mg Oral Daily  . amiodarone  200 mg Oral Daily  . amLODipine  10 mg Oral Daily  . aspirin EC  81 mg Oral QHS  . brimonidine  1 drop Both Eyes QODAY  . chlorhexidine  15 mL Mouth Rinse BID  . clopidogrel  75 mg Oral Daily  . doxazosin  4 mg Oral Daily  . enoxaparin (LOVENOX) injection  30 mg Subcutaneous Q24H  . ezetimibe  10 mg Oral Daily  . FLUoxetine  20 mg Oral Daily  . hydrALAZINE  75 mg Oral BID  . isosorbide mononitrate  120 mg Oral Daily  . mouth rinse  15 mL Mouth Rinse q12n4p  .  Melatonin  3 mg Oral QHS  . metoprolol tartrate  25 mg Oral BID  . polyethylene glycol  17 g Oral BID  . ranolazine  500 mg Oral BID  . senna-docusate  1 tablet Oral BID      Otelia Santee, MD 05/14/2017, 8:14 AM

## 2017-05-14 NOTE — Progress Notes (Signed)
Patient ID: Nathan Wolfe, male   DOB: 03/14/1945, 72 y.o.   MRN: [id number] No new cardiac events.  Deal with progressive renal insufficiency.  Creatinine today 4.8.  Discussed with Dr.Lin.  He does not feel that there is any indication for acute hemodialysis therefore will not place a tunneled catheter.  Plan right arm AV fistula creation tomorrow.  Again discussed this at length with the patient and his daughter present who understand

## 2017-05-15 ENCOUNTER — Inpatient Hospital Stay (HOSPITAL_COMMUNITY): Payer: Medicare Other | Admitting: Anesthesiology

## 2017-05-15 ENCOUNTER — Encounter (HOSPITAL_COMMUNITY): Payer: Self-pay | Admitting: Certified Registered Nurse Anesthetist

## 2017-05-15 ENCOUNTER — Encounter (HOSPITAL_COMMUNITY)
Admission: EM | Disposition: A | Discharge status: Home Health | Payer: Self-pay | Source: Home / Self Care | Attending: Family Medicine

## 2017-05-15 DIAGNOSIS — N185 Chronic kidney disease, stage 5: Secondary | ICD-10-CM

## 2017-05-15 HISTORY — PX: AV FISTULA PLACEMENT: SHX1204

## 2017-05-15 LAB — CBC WITH DIFFERENTIAL/PLATELET
Basophils Absolute: 0 10*3/uL (ref 0.0–0.1)
Basophils Relative: 1 %
EOS ABS: 0.4 10*3/uL (ref 0.0–0.7)
EOS PCT: 6 %
HCT: 31.9 % — ABNORMAL LOW (ref 39.0–52.0)
HEMOGLOBIN: 10.8 g/dL — AB (ref 13.0–17.0)
LYMPHS ABS: 1.5 10*3/uL (ref 0.7–4.0)
Lymphocytes Relative: 23 %
MCH: 32.3 pg (ref 26.0–34.0)
MCHC: 33.9 g/dL (ref 30.0–36.0)
MCV: 95.5 fL (ref 78.0–100.0)
MONO ABS: 0.5 10*3/uL (ref 0.1–1.0)
MONOS PCT: 7 %
Neutro Abs: 4.2 10*3/uL (ref 1.7–7.7)
Neutrophils Relative %: 63 %
Platelets: 566 10*3/uL — ABNORMAL HIGH (ref 150–400)
RBC: 3.34 MIL/uL — ABNORMAL LOW (ref 4.22–5.81)
RDW: 14.2 % (ref 11.5–15.5)
WBC: 6.6 10*3/uL (ref 4.0–10.5)

## 2017-05-15 LAB — RENAL FUNCTION PANEL
Albumin: 3.4 g/dL — ABNORMAL LOW (ref 3.5–5.0)
Anion gap: 14 (ref 5–15)
BUN: 118 mg/dL — AB (ref 6–20)
CHLORIDE: 91 mmol/L — AB (ref 101–111)
CO2: 30 mmol/L (ref 22–32)
Calcium: 10.2 mg/dL (ref 8.9–10.3)
Creatinine, Ser: 4.81 mg/dL — ABNORMAL HIGH (ref 0.61–1.24)
GFR calc Af Amer: 13 mL/min — ABNORMAL LOW (ref >60)
GFR, EST NON AFRICAN AMERICAN: 11 mL/min — AB (ref >60)
Glucose, Bld: 129 mg/dL — ABNORMAL HIGH (ref 65–99)
POTASSIUM: 3.7 mmol/L (ref 3.5–5.1)
Phosphorus: 4.8 mg/dL — ABNORMAL HIGH (ref 2.5–4.6)
Sodium: 135 mmol/L (ref 135–145)

## 2017-05-15 LAB — PROTIME-INR
INR: 1.36
PROTHROMBIN TIME: 16.6 s — AB (ref 11.4–15.2)

## 2017-05-15 LAB — SURGICAL PCR SCREEN
MRSA, PCR: NEGATIVE
STAPHYLOCOCCUS AUREUS: NEGATIVE

## 2017-05-15 LAB — TROPONIN I: TROPONIN I: 0.05 ng/mL — AB (ref <0.03)

## 2017-05-15 SURGERY — ARTERIOVENOUS (AV) FISTULA CREATION
Anesthesia: Monitor Anesthesia Care | Site: Arm Lower | Laterality: Right

## 2017-05-15 MED ORDER — SUGAMMADEX SODIUM 200 MG/2ML IV SOLN
INTRAVENOUS | Status: AC
  Filled 2017-05-15: qty 2

## 2017-05-15 MED ORDER — PROPOFOL 500 MG/50ML IV EMUL
INTRAVENOUS | Status: DC | PRN
  Administered 2017-05-15: 100 ug/kg/min via INTRAVENOUS

## 2017-05-15 MED ORDER — ALBUMIN HUMAN 5 % IV SOLN
INTRAVENOUS | Status: DC | PRN
  Administered 2017-05-15: 16:00:00 via INTRAVENOUS

## 2017-05-15 MED ORDER — FENTANYL CITRATE (PF) 250 MCG/5ML IJ SOLN
INTRAMUSCULAR | Status: AC
  Filled 2017-05-15: qty 5

## 2017-05-15 MED ORDER — EPHEDRINE SULFATE 50 MG/ML IJ SOLN
INTRAMUSCULAR | Status: DC | PRN
  Administered 2017-05-15: 15 mg via INTRAVENOUS
  Administered 2017-05-15 (×2): 10 mg via INTRAVENOUS

## 2017-05-15 MED ORDER — OXYCODONE-ACETAMINOPHEN 5-325 MG PO TABS: 1.0000 | ORAL_TABLET | Freq: Four times a day (QID) | ORAL | Status: DC | PRN

## 2017-05-15 MED ORDER — LIDOCAINE-EPINEPHRINE 0.5 %-1:200000 IJ SOLN
INTRAMUSCULAR | Status: AC
  Filled 2017-05-15: qty 1

## 2017-05-15 MED ORDER — PHENYLEPHRINE HCL 10 MG/ML IJ SOLN
INTRAMUSCULAR | Status: DC | PRN
  Administered 2017-05-15: 120 ug via INTRAVENOUS

## 2017-05-15 MED ORDER — ROCURONIUM BROMIDE 10 MG/ML (PF) SYRINGE
PREFILLED_SYRINGE | INTRAVENOUS | Status: AC
Start: 2017-05-15 — End: 2017-05-15
  Filled 2017-05-15: qty 5

## 2017-05-15 MED ORDER — PHENYLEPHRINE HCL 10 MG/ML IJ SOLN
INTRAVENOUS | Status: DC | PRN
  Administered 2017-05-15: 50 ug/min via INTRAVENOUS

## 2017-05-15 MED ORDER — SODIUM CHLORIDE 0.9 % IV SOLN
INTRAVENOUS | Status: DC | PRN
  Administered 2017-05-15: 16:00:00 500 mL

## 2017-05-15 MED ORDER — PROMETHAZINE HCL 25 MG/ML IJ SOLN: 6.2500 mg | INTRAMUSCULAR | Status: DC | PRN

## 2017-05-15 MED ORDER — ASPIRIN 325 MG PO TABS
325.0000 mg | ORAL_TABLET | Freq: Every day | ORAL | Status: DC
  Administered 2017-05-15 – 2017-05-16 (×2): 325 mg via ORAL
  Filled 2017-05-15 (×2): qty 1

## 2017-05-15 MED ORDER — NITROGLYCERIN 0.4 MG/SPRAY TL SOLN
1.0000 | Status: DC | PRN
  Filled 2017-05-15: qty 4.9

## 2017-05-15 MED ORDER — CALCIUM CHLORIDE 10 % IV SOLN
INTRAVENOUS | Status: DC | PRN
  Administered 2017-05-15 (×2): 200 mg via INTRAVENOUS

## 2017-05-15 MED ORDER — OXYCODONE HCL 5 MG/5ML PO SOLN: 5.0000 mg | Freq: Once | ORAL | Status: DC | PRN

## 2017-05-15 MED ORDER — NITROGLYCERIN 0.4 MG SL SUBL
SUBLINGUAL_TABLET | SUBLINGUAL | Status: AC
  Administered 2017-05-15: 0.4 mg via SUBLINGUAL
  Filled 2017-05-15: qty 1

## 2017-05-15 MED ORDER — DEXAMETHASONE SODIUM PHOSPHATE 10 MG/ML IJ SOLN
INTRAMUSCULAR | Status: AC
  Filled 2017-05-15: qty 1

## 2017-05-15 MED ORDER — LIDOCAINE-EPINEPHRINE 0.5 %-1:200000 IJ SOLN
INTRAMUSCULAR | Status: DC | PRN
  Administered 2017-05-15: 10 mL

## 2017-05-15 MED ORDER — ONDANSETRON HCL 4 MG/2ML IJ SOLN
INTRAMUSCULAR | Status: DC | PRN
  Administered 2017-05-15: 4 mg via INTRAVENOUS

## 2017-05-15 MED ORDER — NITROGLYCERIN 2 % TD OINT
0.5000 [in_us] | TOPICAL_OINTMENT | Freq: Four times a day (QID) | TRANSDERMAL | Status: DC
  Administered 2017-05-15 – 2017-05-16 (×3): 0.5 [in_us] via TOPICAL
  Filled 2017-05-15: qty 30

## 2017-05-15 MED ORDER — LIDOCAINE HCL (CARDIAC) 20 MG/ML IV SOLN
INTRAVENOUS | Status: AC
  Filled 2017-05-15: qty 10

## 2017-05-15 MED ORDER — SODIUM CHLORIDE 0.9 % IV SOLN
INTRAVENOUS | Status: AC
  Filled 2017-05-15: qty 1.2

## 2017-05-15 MED ORDER — ONDANSETRON HCL 4 MG/2ML IJ SOLN
INTRAMUSCULAR | Status: AC
  Filled 2017-05-15: qty 2

## 2017-05-15 MED ORDER — 0.9 % SODIUM CHLORIDE (POUR BTL) OPTIME
TOPICAL | Status: DC | PRN
  Administered 2017-05-15: 1000 mL

## 2017-05-15 MED ORDER — PROPOFOL 10 MG/ML IV BOLUS
INTRAVENOUS | Status: DC | PRN
  Administered 2017-05-15: 15 mg via INTRAVENOUS
  Administered 2017-05-15: 10 mg via INTRAVENOUS
  Administered 2017-05-15: 20 mg via INTRAVENOUS
  Administered 2017-05-15: 10 mg via INTRAVENOUS

## 2017-05-15 MED ORDER — PHENYLEPHRINE 40 MCG/ML (10ML) SYRINGE FOR IV PUSH (FOR BLOOD PRESSURE SUPPORT)
PREFILLED_SYRINGE | INTRAVENOUS | Status: AC
  Filled 2017-05-15: qty 10

## 2017-05-15 MED ORDER — OXYCODONE HCL 5 MG PO TABS
5.0000 mg | ORAL_TABLET | Freq: Once | ORAL | Status: DC | PRN
Start: 2017-05-15 — End: 2017-05-15

## 2017-05-15 MED ORDER — SODIUM CHLORIDE 0.9 % IV SOLN
INTRAVENOUS | Status: DC
  Administered 2017-05-15 (×2): via INTRAVENOUS

## 2017-05-15 MED ORDER — PROPOFOL 10 MG/ML IV BOLUS
INTRAVENOUS | Status: AC
  Filled 2017-05-15: qty 20

## 2017-05-15 MED ORDER — HYDROMORPHONE HCL 1 MG/ML IJ SOLN
0.2500 mg | INTRAMUSCULAR | Status: DC | PRN
Start: 2017-05-15 — End: 2017-05-15

## 2017-05-15 SURGICAL SUPPLY — 26 items
ARMBAND PINK RESTRICT EXTREMIT (MISCELLANEOUS) ×6 IMPLANT
CANISTER SUCT 3000ML PPV (MISCELLANEOUS) ×3 IMPLANT
CANNULA VESSEL 3MM 2 BLNT TIP (CANNULA) ×3 IMPLANT
CLIP LIGATING EXTRA MED SLVR (CLIP) ×3 IMPLANT
CLIP LIGATING EXTRA SM BLUE (MISCELLANEOUS) ×3 IMPLANT
COVER PROBE W GEL 5X96 (DRAPES) ×3 IMPLANT
DECANTER SPIKE VIAL GLASS SM (MISCELLANEOUS) ×3 IMPLANT
DERMABOND ADVANCED (GAUZE/BANDAGES/DRESSINGS) ×2
DERMABOND ADVANCED .7 DNX12 (GAUZE/BANDAGES/DRESSINGS) ×1 IMPLANT
ELECT REM PT RETURN 9FT ADLT (ELECTROSURGICAL) ×3
ELECTRODE REM PT RTRN 9FT ADLT (ELECTROSURGICAL) ×1 IMPLANT
GLOVE SS BIOGEL STRL SZ 7.5 (GLOVE) ×1 IMPLANT
GLOVE SUPERSENSE BIOGEL SZ 7.5 (GLOVE) ×2
GOWN STRL REUS W/ TWL LRG LVL3 (GOWN DISPOSABLE) ×3 IMPLANT
GOWN STRL REUS W/TWL LRG LVL3 (GOWN DISPOSABLE) ×6
KIT BASIN OR (CUSTOM PROCEDURE TRAY) ×3 IMPLANT
KIT TURNOVER KIT B (KITS) ×3 IMPLANT
NS IRRIG 1000ML POUR BTL (IV SOLUTION) ×3 IMPLANT
PACK CV ACCESS (CUSTOM PROCEDURE TRAY) ×3 IMPLANT
PAD ARMBOARD 7.5X6 YLW CONV (MISCELLANEOUS) ×6 IMPLANT
SUT PROLENE 6 0 CC (SUTURE) ×3 IMPLANT
SUT VIC AB 3-0 SH 27 (SUTURE) ×2
SUT VIC AB 3-0 SH 27X BRD (SUTURE) ×1 IMPLANT
TOWEL GREEN STERILE (TOWEL DISPOSABLE) ×3 IMPLANT
UNDERPAD 30X30 (UNDERPADS AND DIAPERS) ×3 IMPLANT
WATER STERILE IRR 1000ML POUR (IV SOLUTION) ×3 IMPLANT

## 2017-05-15 NOTE — Interval H&P Note (Signed)
History and Physical Interval Note:  05/15/2017 2:54 PM  Nathan Wolfe  has presented today for surgery, with the diagnosis of CHRONIC KIDNEY DISEASE FOR HEMODIALYSIS ACCESS  The various methods of treatment have been discussed with the patient and family. After consideration of risks, benefits and other options for treatment, the patient has consented to  Procedure(s): ARTERIOVENOUS (AV) FISTULA CREATION RIGHT ARM (Right) as a surgical intervention .  The patient's history has been reviewed, patient examined, no change in status, stable for surgery.  I have reviewed the patient's chart and labs.  Questions were answered to the patient's satisfaction.     Curt Jews

## 2017-05-15 NOTE — Progress Notes (Signed)
CRITICAL VALUE ALERT  Critical Value:  Trop I 0.05  Date & Time Notied:  05/15/17  Provider Notified: Dr. Kennon Holter  Orders Received/Actions taken: None

## 2017-05-15 NOTE — Discharge Instructions (Signed)
° °  [facility]  Discharge Instructions  AV Fistula or Graft Surgery for Dialysis Access  Please refer to the following instructions for your post-procedure care. Your surgeon or physician assistant will discuss any changes with you.  Activity  You may drive the day following your surgery, if you are comfortable and no longer taking prescription pain medication. Resume full activity as the soreness in your incision resolves.  Bathing/Showering  You may shower after you go home. Keep your incision dry for 48 hours. Do not soak in a bathtub, hot tub, or swim until the incision heals completely. You may not shower if you have a hemodialysis catheter.  Incision Care  Clean your incision with mild soap and water after 48 hours. Pat the area dry with a clean towel. You do not need a bandage unless otherwise instructed. Do not apply any ointments or creams to your incision. You may have skin glue on your incision. Do not peel it off. It will come off on its own in about one week. Your arm may swell a bit after surgery. To reduce swelling use pillows to elevate your arm so it is above your heart. Your doctor will tell you if you need to lightly wrap your arm with an ACE bandage.  Diet  Resume your normal diet. There are not special food restrictions following this procedure. In order to heal from your surgery, it is CRITICAL to get adequate nutrition. Your body requires vitamins, minerals, and protein. Vegetables are the best source of vitamins and minerals. Vegetables also provide the perfect balance of protein. Processed food has little nutritional value, so try to avoid this.  Medications  Resume taking all of your medications. If your incision is causing pain, you may take over-the counter pain relievers such as acetaminophen (Tylenol). If you were prescribed a stronger pain medication, please be aware these medications can cause nausea and constipation. Prevent  nausea by taking the medication with a snack or meal. Avoid constipation by drinking plenty of fluids and eating foods with high amount of fiber, such as fruits, vegetables, and grains.  Do not take Tylenol if you are taking prescription pain medications.  Follow up Your surgeon may want to see you in the office following your access surgery. If so, this will be arranged at the time of your surgery.  Please call us immediately for any of the following conditions:  Increased pain, redness, drainage (pus) from your incision site Fever of 101 degrees or higher Severe or worsening pain at your incision site Hand pain or numbness.  Reduce your risk of vascular disease:  Stop smoking. If you would like help, call [facility] at 1-800-QUIT-NOW (267)751-2125) or [place] at [place] your cholesterol Maintain a desired weight Control your diabetes Keep your blood pressure down  Dialysis  It will take several weeks to several months for your new dialysis access to be ready for use. Your surgeon will determine when it is okay to use it. Your nephrologist will continue to direct your dialysis. You can continue to use your Permcath until your new access is ready for use.   05/15/2017 Nathan Wolfe [id number] Oct 20, 1945  Surgeon(s): Early, Arvilla Meres, MD  Procedure(s): Right brachiocephalic AV fistula   x Do not stick fistula for 12 weeks    If you have any questions, please call the office at [phone].

## 2017-05-15 NOTE — Transfer of Care (Signed)
Immediate Anesthesia Transfer of Care Note  Patient: Nathan Wolfe  Procedure(s) Performed: CREATION OF ARTERIOVENOUS (AV) FISTULA, RIGHT ARM (Right Arm Lower)  Patient Location: PACU  Anesthesia Type:MAC  Level of Consciousness: awake, alert , oriented and patient cooperative  Airway & Oxygen Therapy: Patient Spontanous Breathing  Post-op Assessment: Report given to RN and Post -op Vital signs reviewed and stable  Post vital signs: Reviewed and stable  Last Vitals:  Vitals Value Taken Time  BP    Temp    Pulse    Resp    SpO2      Last Pain:  Vitals:   05/15/17 1148  TempSrc: Oral  PainSc: 0-No pain      Patients Stated Pain Goal: 0 (12/03/49 0258)  Complications: No apparent anesthesia complications

## 2017-05-15 NOTE — Progress Notes (Signed)
Hospitalist progress note   Erving Sassano  RWE:[id number] DOB: [birth year] DOA: 04/30/2017 PCP: Patient, No Pcp Per Specialists:   Cardiology Nephrology Vascular surgery  Brief Narrative:   72 year old male originally from [place] with Dr. 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 Diagnoses of Chest pain, Cough, Abnormal CXR, Hypoxia, Pulmonary edema, Respiratory distress, Constipation, Chronic renal failure, and Anxiety were also pertinent to this visit.  Unstable angina-admitted to medical floor-last cath 04/2014 Kept on aspirin Plavix placed on nitroglycerin infusion cardiology evaluation felt--high risk of nephrotoxicity with cardiac cath-troponin peak was 4.4-Felt most appropriate for medical management placed on amlodipine ranolazine, metoprolol, long-acting nitrate On asa 81 daily Has had recurrent CP today 3/29 post-op for fistula ekg and CE's but not a great candidate for cath--given 2 nitro sl and better--start Nitro PAste , follow troponin--ir needed and troponin higher than peak, giving 325 asa now Going back to 2H  Sepsis secondary to flu, possible pneumonia ?-monitor--last temp 102.2 3/20 Blood culture no growth to date and sputum culture gram-positive cocci  few gram-negative rods Now has had 12 days of cefepime doxycycline-stopped Abx 3.27 D/c CT chest-complete Rx Bact PNA with no new sympt  obst uropathy causing abd pain- could have been distended bladder?--see nursing noted 3/28 has foley indwelling for the time being Await resolution of the AKI  New onset paroxysmal atrial flutter fib-cardiology managing placed on amiodarone with some sinus bradycardia so meds adjusted-metoprolol dropped to 25 twice daily-anticoagulation ?recurrence of Afib afib--Dfer to cardiology No changes today  Acute/chronic diastolic heart failure-nephrology consulted given the rising creatinine from admission 63/2.9-->114/4.7, patient is net -5.4 L and weight is down from 123.5-116--->112 currently--nephrology managing diuresis Lasix IV 120 every 8 desat screen shows need for oxygen will repeat once on tele floor-has been more dyspneic over the past year  AK I superimposed on CKD stage IV-creatinine rising-see above discussion --Vascular has been consulted and vein mapping performed 3/27 for fistula creation dependent on wishes for dialysis versus not fisutla placed 3/29 RUE  Hypertension-Moderately controlled with some hypotension now resolved-current meds of amiodarone 200 daily, amlodipine 10, hydralazine 75 twice daily, Imdur 120 daily, metoprolol 25 twice daily  Bipolar continue Xanax 0.5 3 times daily as needed, fluoxetine 20 daily  BPH continue Cardura 4 mg daily  OSA on CPAP at night  Anemia of chronic disease  Hyperlipidemia continue statin--discussion per cardiology  DVT prophylaxis: Lovenoex  Code Status:    ful   Family Communication:   D/w daughter extensively Disposition Plan: IP transfer to tele   Consultants:   Cardiology  Nephrology  Renal  Procedures:   Vein mapping 3/26  ECho 3/17-Study Conclusions  - Left ventricle: The cavity size was normal. Wall thickness was   increased in a pattern of mild LVH. Systolic function was  normal.   The estimated ejection fraction was in the range  of 50% to 55%.   Mild hypokinesis of the anteroseptal myocardium. Features are   consistent with a pseudonormal left ventricular filling pattern,   with concomitant abnormal relaxation and increased filling   pressure (grade 2 diastolic dysfunction). Doppler parameters are   consistent with high ventricular filling pressure. - Aortic valve: Transvalvular velocity was within the normal range.   There was no stenosis. There was no regurgitation. - Mitral valve: Transvalvular velocity was within the normal range.   There was no evidence for stenosis. There was trivial   regurgitation. - Left atrium: The atrium was severely dilated. - Right ventricle: The cavity size was normal. Wall thickness was   normal. Systolic function was normal. - Atrial septum: No defect or patent foramen ovale was identified   by color flow Doppler. - Tricuspid valve: There was mild regurgitation. - Pulmonary arteries: Systolic pressure was moderately to severely    increased. PA peak pressure: 59 mm Hg (S  Antimicrobials:   Stopped doxy and cefepime 3/27   Subjective:  Seen on PACU Having mild discomofrt to chest not unlike his prior HA--no rad no diapheresis and no sweat No fever no chills no cough  Objective: Vitals:   05/15/17 0024 05/15/17 0444 05/15/17 0935 05/15/17 1148  BP: (!) 107/42 (!) 122/48 (!) 131/52 (!) 108/50  Pulse: (!) 55 64 (!) 51 (!) 48  Resp: 18 18  20   Temp: 98.3 F (36.8 C) 97.8 F (36.6 C) 98 F (36.7 C) 98.1 F (36.7 C)  TempSrc: Oral Oral Oral Oral  SpO2: 94% 95% 98% 95%  Weight:  112.8 kg (248 lb 9.6 oz)    Height:        Intake/Output Summary (Last 24 hours) at 05/15/2017 1743 Last data filed at 05/15/2017 1712 Gross per 24 hour  Intake 750 ml  Output 2035 ml  Net -1285 ml   Filed Weights   05/14/17 0500 05/14/17 2137 05/15/17 0444  Weight: 116.3 kg (256 lb 6.3 oz) 113.7 kg (250 lb 9.6 oz) 112.8 kg (248  lb 9.6 oz)    Examination:  alert in nad-no jvd, no bruit bilat Thick neck, no SM lymphadenopathy RUE fistula noted,clean s1 s2 no m/r/g- ta b no added sound no rales Grade 2  LE edema--skin without dishydrotic changes Neuro intact  Data Reviewed: I have personally reviewed following labs and imaging studies  CBC: Recent Labs  Lab 05/11/17 0321 05/12/17 0309 05/13/17 0352 05/14/17 0241 05/15/17 0646  WBC 7.0 6.6 7.2 6.8 6.6  NEUTROABS 4.6 4.5 4.7 4.5 4.2  HGB 10.0* 9.6* 11.0* 10.2* 10.8*  HCT 29.3* 28.4* 32.5* 30.2* 31.9*  MCV 93.9 93.7 94.8 94.7 95.5  PLT 402* 430* 540* 478* 970*   Basic Metabolic Panel: Recent Labs  Lab 05/11/17 1109 05/12/17 0309 05/13/17 0352 05/14/17 0241 05/15/17 0646  NA 131* 133* 133* 134* 135  K 4.5 3.7 4.0 4.0 3.7  CL 93* 92* 91* 91* 91*  CO2 19* 27 25 26 30   GLUCOSE 158* 137* 119* 109* 129*  BUN 106* 107* 114* 120* 118*  CREATININE 4.30* 4.36* 4.72* 4.80* 4.81*  CALCIUM 9.8 9.5 9.9 9.7 10.2  PHOS  --   --   --   --  4.8*   GFR: Estimated Creatinine Clearance: 16.4 mL/min (A) (by C-G formula based on SCr of 4.81 mg/dL (H)). Liver Function Tests: Recent Labs  Lab 05/11/17 1432 05/15/17 0646  ALBUMIN 3.2* 3.4*   No results for input(s): LIPASE, AMYLASE in the last  168 hours. No results for input(s): AMMONIA in the last 168 hours. Coagulation Profile: Recent Labs  Lab 05/15/17 0646  INR 1.36   Cardiac Enzymes: Recent Labs  Lab 05/08/17 1833  TROPONINI 2.50*   CBG: No results for input(s): GLUCAP in the last 168 hours. Urine analysis: No results found for: COLORURINE, APPEARANCEUR, LABSPEC, [place], GLUCOSEU, [place], [place], [place], [place], [employer], NITRITE, [place]   Radiology Studies: Reviewed images personally in health database    Scheduled Meds: . [MAR Hold] allopurinol  100 mg Oral Daily  . [MAR Hold] amiodarone  200 mg Oral Daily  . [MAR Hold] amLODipine  10 mg Oral Daily  . [MAR  Hold] aspirin EC  81 mg Oral QHS  . [MAR Hold] brimonidine  1 drop Both Eyes QODAY  . [MAR Hold] chlorhexidine  15 mL Mouth Rinse BID  . [MAR Hold] clopidogrel  75 mg Oral Daily  . [MAR Hold] doxazosin  4 mg Oral Daily  . [MAR Hold] enoxaparin (LOVENOX) injection  30 mg Subcutaneous Q24H  . [MAR Hold] ezetimibe  10 mg Oral Daily  . [MAR Hold] FLUoxetine  20 mg Oral Daily  . [MAR Hold] hydrALAZINE  75 mg Oral BID  . [MAR Hold] isosorbide mononitrate  120 mg Oral Daily  . [MAR Hold] mouth rinse  15 mL Mouth Rinse q12n4p  . [MAR Hold] Melatonin  3 mg Oral QHS  . [MAR Hold] metoprolol tartrate  25 mg Oral BID  . [MAR Hold] polyethylene glycol  17 g Oral BID  . [MAR Hold] ranolazine  500 mg Oral BID  . [MAR Hold] senna-docusate  1 tablet Oral BID   Continuous Infusions: . sodium chloride 10 mL/hr at 05/15/17 1342  . [MAR Hold] furosemide Stopped (05/14/17 2351)     LOS: 14 days    Time spent: [place], MD [facility]   If 7PM-7AM, please contact night-coverage [email or web address] Password [facility] 05/15/2017, 5:43 PM

## 2017-05-15 NOTE — Anesthesia Procedure Notes (Signed)
Procedure Name: MAC Date/Time: 05/15/2017 3:35 PM Performed by: White, Amedeo Plenty, CRNA Pre-anesthesia Checklist: Patient identified, Emergency Drugs available, Suction available and Patient being monitored Patient Re-evaluated:Patient Re-evaluated prior to induction Oxygen Delivery Method: Simple face mask

## 2017-05-15 NOTE — Anesthesia Postprocedure Evaluation (Signed)
Anesthesia Post Note  Patient: Nathan Wolfe  Procedure(s) Performed: CREATION OF ARTERIOVENOUS (AV) FISTULA, RIGHT ARM (Right Arm Lower)     Patient location during evaluation: PACU Anesthesia Type: MAC Level of consciousness: awake and alert Pain management: pain level controlled Vital Signs Assessment: post-procedure vital signs reviewed and stable Respiratory status: spontaneous breathing, nonlabored ventilation, respiratory function stable and patient connected to nasal cannula oxygen Cardiovascular status: blood pressure returned to baseline and stable Postop Assessment: no apparent nausea or vomiting Anesthetic complications: no    Last Vitals:  Vitals:   05/15/17 1915 05/15/17 1930  BP: (!) 114/50 (!) 108/46  Pulse: (!) 57 62  Resp: 15 15  Temp:    SpO2: 100% 98%    Last Pain:  Vitals:   05/15/17 1915  TempSrc:   PainSc: 0-No pain                 Cameryn Schum COKER

## 2017-05-15 NOTE — Progress Notes (Signed)
Progress Note  Patient Name: Nathan Wolfe Date of Encounter: 05/15/2017  Primary Cardiologist: Quay Burow, MD   Subjective   Lying flat.  No chest discomfort.  Awaiting placement of hemodialysis access.  Anginal symptoms have resolved and breathing is markedly improved with aggressive diuresis.  Kidney function seems to have plateaued at creatinine of 4.8, with significant azotemia (BUN greater than 100).  No recurrence of atrial fibrillation.  Continue amiodarone for rhythm control.  Anticoagulation therapy has not been started and will need to be addressed.  For the time being in dual antiplatelet therapy will be continued (taking this because of severe underlying coronary disease).  Concerned about significant bleeding risk on triple drug therapy.  If has recurrent A. fib on amiodarone will need anticoagulation.  Once he is healthier, will need to decide about duration of amiodarone therapy.  A lot of this will probably need to be taken care of by his cardiologist in [place].  Inpatient Medications    Scheduled Meds: . allopurinol  100 mg Oral Daily  . amiodarone  200 mg Oral Daily  . amLODipine  10 mg Oral Daily  . aspirin EC  81 mg Oral QHS  . brimonidine  1 drop Both Eyes QODAY  . chlorhexidine  15 mL Mouth Rinse BID  . clopidogrel  75 mg Oral Daily  . doxazosin  4 mg Oral Daily  . enoxaparin (LOVENOX) injection  30 mg Subcutaneous Q24H  . ezetimibe  10 mg Oral Daily  . FLUoxetine  20 mg Oral Daily  . hydrALAZINE  75 mg Oral BID  . isosorbide mononitrate  120 mg Oral Daily  . mouth rinse  15 mL Mouth Rinse q12n4p  . Melatonin  3 mg Oral QHS  . metoprolol tartrate  25 mg Oral BID  . polyethylene glycol  17 g Oral BID  . ranolazine  500 mg Oral BID  . senna-docusate  1 tablet Oral BID   Continuous Infusions: .  ceFAZolin (ANCEF) IV    . furosemide Stopped (05/14/17 2351)   PRN Meds: acetaminophen, ALPRAZolam, benzonatate, diphenhydrAMINE, gi cocktail,  hydrALAZINE, ipratropium-albuterol, nitroGLYCERIN, ondansetron (ZOFRAN) IV   Vital Signs    Vitals:   05/15/17 0024 05/15/17 0444 05/15/17 0935 05/15/17 1148  BP: (!) 107/42 (!) 122/48 (!) 131/52 (!) 108/50  Pulse: (!) 55 64 (!) 51 (!) 48  Resp: 18 18  20   Temp: 98.3 F (36.8 C) 97.8 F (36.6 C) 98 F (36.7 C) 98.1 F (36.7 C)  TempSrc: Oral Oral Oral Oral  SpO2: 94% 95% 98% 95%  Weight:  248 lb 9.6 oz (112.8 kg)    Height:        Intake/Output Summary (Last 24 hours) at 05/15/2017 1216 Last data filed at 05/15/2017 8676 Gross per 24 hour  Intake 242 ml  Output 3875 ml  Net -3633 ml   Filed Weights   05/14/17 0500 05/14/17 2137 05/15/17 0444  Weight: 256 lb 6.3 oz (116.3 kg) 250 lb 9.6 oz (113.7 kg) 248 lb 9.6 oz (112.8 kg)    Telemetry    Sinus bradycardia and sinus rhythm.  No atrial fibrillation.- Personally Reviewed  ECG    No new data- Personally Reviewed  Physical Exam  Obese.  Lying flat. GEN: No acute distress.   Neck: No JVD Cardiac: RRR, no murmurs, rubs, or gallops.  Respiratory: Clear to auscultation bilaterally. GI: Soft, nontender, non-distended  MS: No deformity.  Bilateral lower extremity edema, 2+. Neuro:  Nonfocal  Psych: Normal  affect   Labs    Chemistry Recent Labs  Lab 05/11/17 1432  05/13/17 0352 05/14/17 0241 05/15/17 0646  NA  --    < > 133* 134* 135  K  --    < > 4.0 4.0 3.7  CL  --    < > 91* 91* 91*  CO2  --    < > 25 26 30   GLUCOSE  --    < > 119* 109* 129*  BUN  --    < > 114* 120* 118*  CREATININE  --    < > 4.72* 4.80* 4.81*  CALCIUM  --    < > 9.9 9.7 10.2  ALBUMIN 3.2*  --   --   --  3.4*  GFRNONAA  --    < > 11* 11* 11*  GFRAA  --    < > 13* 13* 13*  ANIONGAP  --    < > 17* 17* 14   < > = values in this interval not displayed.     Hematology Recent Labs  Lab 05/13/17 0352 05/14/17 0241 05/15/17 0646  WBC 7.2 6.8 6.6  RBC 3.43* 3.19* 3.34*  HGB 11.0* 10.2* 10.8*  HCT 32.5* 30.2* 31.9*  MCV 94.8 94.7  95.5  MCH 32.1 32.0 32.3  MCHC 33.8 33.8 33.9  RDW 13.9 13.8 14.2  PLT 540* 478* 566*    Cardiac Enzymes Recent Labs  Lab 05/08/17 1520 05/08/17 1833  TROPONINI 2.62* 2.50*   No results for input(s): TROPIPOC in the last 168 hours.   BNPNo results for input(s): BNP, PROBNP in the last 168 hours.   DDimer No results for input(s): DDIMER in the last 168 hours.   Radiology    No results found.  Cardiac Studies   2D Doppler echocardiogram 05/03/2017: Study Conclusions   - Left ventricle: The cavity size was normal. Wall thickness was   increased in a pattern of mild LVH. Systolic function was normal.   The estimated ejection fraction was in the range of 50% to 55%.   Mild hypokinesis of the anteroseptal myocardium. Features are   consistent with a pseudonormal left ventricular filling pattern,   with concomitant abnormal relaxation and increased filling   pressure (grade 2 diastolic dysfunction). Doppler parameters are   consistent with high ventricular filling pressure. - Aortic valve: Transvalvular velocity was within the normal range.   There was no stenosis. There was no regurgitation. - Mitral valve: Transvalvular velocity was within the normal range.   There was no evidence for stenosis. There was trivial   regurgitation. - Left atrium: The atrium was severely dilated. - Right ventricle: The cavity size was normal. Wall thickness was   normal. Systolic function was normal. - Atrial septum: No defect or patent foramen ovale was identified   by color flow Doppler. - Tricuspid valve: There was mild regurgitation. - Pulmonary arteries: Systolic pressure was moderately to severely   increased. PA peak pressure: 59 mm Hg (S).     Patient Profile     72 y.o. male with extensive CAD history and previous redo bypass surgery in 2000, 2011 multiple subsequent drug-eluting stents including SVG to the PDA 2016, with known occlusion of the LAD beyond the LIMA bypass.  There  is also a severely diseased ramus intermedius branch not amenable to further revascularization attempts as per the patient's primary cardiologist.  He was admitted for chest pain with elevated troponins but no corresponding EKG changes most likely secondary to  demand ischemia from influenza pneumonia and heart failure exacerbation.  Aggressive diuresis has led to marked worsening in kidney function but dramatic improvement in dyspnea and anginal symptoms.  Having access for future dialysis placed this admission.  Course complicated by paroxysmal atrial fibrillation when having respiratory difficulties.  Now on amiodarone.  Decided against anticoagulation due to potential requirement for long-term triple drug therapy.  Duration of amiodarone therapy and anticoagulation will need to be readdressed when the patient's acute illness has resolved.  Assessment & Plan    1. Acute on chronic combined systolic and diastolic heart failure, markedly improved with aggressive diuresis. 2. 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  Continue current antianginal therapy, which has been decreased in intensity after diuresis. 3. Stage V chronic kidney disease, aggravated by needed aggressive diuresis in the setting of volume overload, heart failure, and refractory angina.  Access for chronic dialysis to be placed today.  We do not plan to perform coronary angiography after speaking with his primary cardiologist. 4. Paroxysmal atrial fibrillation, occurring in the setting of acute respiratory distress.  Currently on amiodarone therapy orally hoping to maintain rhythm control and prevent aggravation of underlying cardiac status.  Chronic anticoagulation therapy has not been started.  No decision has been made about chronicity of amiodarone therapy.  Perhaps these decisions can be better managed  by his primary cardiologist once stable.  If recurrent atrial fibrillation he is on or off amiodarone, anticoagulation therapy should be started and perhaps aspirin discontinued.  We plan no further significant medication adjustments.  We will continue to follow through the weekend as needed.  For questions or updates, please contact [place] Please consult [email or web address] for contact info under Cardiology/STEMI.      Signed, Sinclair Grooms, MD  05/15/2017, 12:16 PM

## 2017-05-15 NOTE — Anesthesia Preprocedure Evaluation (Signed)
Anesthesia Evaluation  Patient identified by MRN, date of birth, ID band Patient awake    Reviewed: Allergy & Precautions, NPO status , Patient's Chart, lab work & pertinent test results  Airway Mallampati: II  TM Distance: >3 FB Neck ROM: Full    Dental no notable dental hx.    Pulmonary neg pulmonary ROS, sleep apnea ,    Pulmonary exam normal breath sounds clear to auscultation       Cardiovascular hypertension, + angina + CAD  negative cardio ROS Normal cardiovascular exam Rhythm:Regular Rate:Normal     Neuro/Psych Anxiety negative neurological ROS  negative psych ROS   GI/Hepatic negative GI ROS, Neg liver ROS,   Endo/Other  negative endocrine ROS  Renal/GU Renal InsufficiencyRenal diseasenegative Renal ROS  negative genitourinary   Musculoskeletal negative musculoskeletal ROS (+)   Abdominal   Peds negative pediatric ROS (+)  Hematology negative hematology ROS (+) anemia ,   Anesthesia Other Findings   Reproductive/Obstetrics negative OB ROS                             Anesthesia Physical Anesthesia Plan  ASA: III  Anesthesia Plan: MAC   Post-op Pain Management:    Induction: Intravenous  PONV Risk Score and Plan: 1 and Ondansetron  Airway Management Planned: Simple Face Mask  Additional Equipment:   Intra-op Plan:   Post-operative Plan:   Informed Consent: I have reviewed the patients History and Physical, chart, labs and discussed the procedure including the risks, benefits and alternatives for the proposed anesthesia with the patient or authorized representative who has indicated his/her understanding and acceptance.   Dental advisory given  Plan Discussed with: CRNA  Anesthesia Plan Comments:         Anesthesia Quick Evaluation

## 2017-05-15 NOTE — Progress Notes (Signed)
Spoke with pt about CPAP. He states his daughter has home machine in waiting room and she will help set it up . I told him if he needs any further assistance to call RT

## 2017-05-15 NOTE — Op Note (Signed)
    OPERATIVE REPORT  DATE OF SURGERY: 05/15/2017  PATIENT: Nathan Wolfe, 72 y.o. male MRN: [id number]  DOB: 1945/05/14  PRE-OPERATIVE DIAGNOSIS: Chronic renal insufficiency  POST-OPERATIVE DIAGNOSIS:  Same  PROCEDURE: Right upper arm radiocephalic AV fistula creation  SURGEON:  Curt Jews, M.D.  PHYSICIAN ASSISTANT: Liana Crocker  ANESTHESIA: Local with sedation  EBL: Minimal ml  Total I/O In: 250 [IV Piggyback:250] Out: 557 [Urine:875]  BLOOD ADMINISTERED: None  DRAINS: None  SPECIMEN: None  COUNTS CORRECT:  YES  PLAN OF CARE: PACU  PATIENT DISPOSITION:  PACU - hemodynamically stable  PROCEDURE DETAILS: The patient was taken to the operating placed supine position where the area of the right arm was prepped and draped in usual sterile fashion.  SonoSite ultrasound was used to visualize the veins in the arm.  Patient had a very large caliber cephalic vein at the antecubital space and proximally.  The vein was of good size just below the antecubital space but then had multiple different branching patterns and became small from the mid forearm to the wrist and was unable usable for fistula.  Using local anesthesia incision was made over the antecubital space and carried down to isolate the cephalic vein and brachial artery.  The vein was of very good caliber and the artery was large as well.  The vein was ligated distally and divided and was brought into approximation with the brachial artery.  The brachial artery was occluded proximally distally and was opened with 11 blade and sent longitudinally with Potts scissors.  The vein was spatulated and sewn end-to-side to the artery with a running 6-0 Prolene suture.  Clamps removed and excellent thrill was noted.  The wounds irrigated with saline.  Hemostasis obtained with cautery.  Wounds were closed with 3-0 Vicryl in the subcutaneous and subcuticular tissue.  Sterile dressing was applied and the patient was transferred to the  recovery room in stable condition   [name], M.D., [facility] 05/15/2017 4:35 PM

## 2017-05-15 NOTE — Progress Notes (Signed)
Nathan Wolfe Progress Note   72 y.o.year-old with hx of CAD hx CABG 2000 and redo 2011, sp L CEA, CKD stage IV,obesity , OSA on CPAP admitted on 05/01/17 with c/o L sided chest pain. Was admitted to SDU for unstable angina. Seen by cardiology, hx complex anatomy, CABG and redo CABG, they recommended medical Rx w IV NTG and heparin. Trop peaked at 4.49 EF of 50-55%, Grade 2 DD   Creat was 2.8on admission and is now up to 4.1 Great diuresis with a weight that has decreased to 259 lbs from 273 IV lasix 120mg  q 8hours  He developed flu and pneumonia and is being treated with antibiotics - cefepime and doxyclycline  He sees a kidney doctor in [facility], he is up visting family here with his wife. His wife is a dialysis patient.Appreciate help from Dr Donnetta Hutching   Assessment/ Plan:   1 Acute on CKD IV - suspect due to decompCHF. Looks better today, getting OOB. Seems to be diuresing well. Suspect renal function will level off soon. No indication for RRT.  Renal ultrasound simple cysts  - hopefully we can avoid initiating dialysis during this hospitalization; no absolute need for HD today but renal function not improving yet (basically same as yesterday). I explained to the family that I don't think it's wrong to insert a tunneled catheter given high likelihood of starting dialysis in the very near future.  - still has edema and dyspnea.  - PVR significant for >823ml --> Foley   - over 4000 mL UOP /24hrs - On Lasix 120mg  IV q8hr --> may be able to back off if there was a significant post obstructive cause  2CKD stage IV - baseline creat 2.8 - 3.5 per family, f/b nephrology in [facility] Get  3 Chest pain / unstable angina - on admit 3/15, better now 4 HTN - on 4 bp meds here as at home, may need to reduce if pt starts to diurese 5 CAD hx CABG 2000/ redo CABG 2011 - EF 50-55 % by echo here 6 HL 7. AnemiaHemoglobin 10 Tsats 20%    Subjective:   Still dyspneic but  markedly improved from admission. Denies any f/c/n/v overnight. Diuresing nicely; significant PVR by bladder scan yesterday with >89mL   Objective:   BP (!) 131/52 (BP Location: Left Arm)   Pulse (!) 51   Temp 98 F (36.7 C) (Oral)   Resp 18   Ht 5\' 7"  (1.702 m)   Wt 112.8 kg (248 lb 9.6 oz)   SpO2 98%   BMI 38.94 kg/m   Intake/Output Summary (Last 24 hours) at 05/15/2017 1024 Last data filed at 05/15/2017 7510 Gross per 24 hour  Intake 362 ml  Output 4475 ml  Net -4113 ml   Weight change: -2.629 kg (-5 lb 12.7 oz)  Physical Exam: GEN - NAD, very pleasasnt CVS- RRR RS- CTA ABD- BS present soft non-distended EXT- tr - 1+  edema    Imaging: No results found.  Labs: BMET Recent Labs  Lab 05/09/17 0352 05/10/17 0259 05/11/17 1109 05/12/17 0309 05/13/17 0352 05/14/17 0241 05/15/17 0646  NA 132* 133* 131* 133* 133* 134* 135  K 4.4 4.2 4.5 3.7 4.0 4.0 3.7  CL 94* 95* 93* 92* 91* 91* 91*  CO2 24 24 19* 27 25 26 30   GLUCOSE 135* 124* 158* 137* 119* 109* 129*  BUN 80* 91* 106* 107* 114* 120* 118*  CREATININE 3.81* 4.13* 4.30* 4.36* 4.72* 4.80* 4.81*  CALCIUM 9.4  9.6 9.8 9.5 9.9 9.7 10.2  PHOS  --   --   --   --   --   --  4.8*   CBC Recent Labs  Lab 05/12/17 0309 05/13/17 0352 05/14/17 0241 05/15/17 0646  WBC 6.6 7.2 6.8 6.6  NEUTROABS 4.5 4.7 4.5 4.2  HGB 9.6* 11.0* 10.2* 10.8*  HCT 28.4* 32.5* 30.2* 31.9*  MCV 93.7 94.8 94.7 95.5  PLT 430* 540* 478* 566*    Medications:    . allopurinol  100 mg Oral Daily  . amiodarone  200 mg Oral Daily  . amLODipine  10 mg Oral Daily  . aspirin EC  81 mg Oral QHS  . brimonidine  1 drop Both Eyes QODAY  . chlorhexidine  15 mL Mouth Rinse BID  . clopidogrel  75 mg Oral Daily  . doxazosin  4 mg Oral Daily  . enoxaparin (LOVENOX) injection  30 mg Subcutaneous Q24H  . ezetimibe  10 mg Oral Daily  . FLUoxetine  20 mg Oral Daily  . hydrALAZINE  75 mg Oral BID  . isosorbide mononitrate  120 mg Oral Daily  .  mouth rinse  15 mL Mouth Rinse q12n4p  . Melatonin  3 mg Oral QHS  . metoprolol tartrate  25 mg Oral BID  . polyethylene glycol  17 g Oral BID  . ranolazine  500 mg Oral BID  . senna-docusate  1 tablet Oral BID      Otelia Santee, MD 05/15/2017, 10:24 AM

## 2017-05-16 ENCOUNTER — Encounter (HOSPITAL_COMMUNITY): Payer: Self-pay | Admitting: Vascular Surgery

## 2017-05-16 LAB — CBC WITH DIFFERENTIAL/PLATELET
Basophils Absolute: 0 10*3/uL (ref 0.0–0.1)
Basophils Relative: 0 %
EOS ABS: 0.4 10*3/uL (ref 0.0–0.7)
EOS PCT: 5 %
HCT: 29.1 % — ABNORMAL LOW (ref 39.0–52.0)
HEMOGLOBIN: 9.6 g/dL — AB (ref 13.0–17.0)
LYMPHS ABS: 1.2 10*3/uL (ref 0.7–4.0)
Lymphocytes Relative: 14 %
MCH: 31.8 pg (ref 26.0–34.0)
MCHC: 33 g/dL (ref 30.0–36.0)
MCV: 96.4 fL (ref 78.0–100.0)
MONO ABS: 0.6 10*3/uL (ref 0.1–1.0)
MONOS PCT: 7 %
NEUTROS PCT: 74 %
Neutro Abs: 6.5 10*3/uL (ref 1.7–7.7)
Platelets: 473 10*3/uL — ABNORMAL HIGH (ref 150–400)
RBC: 3.02 MIL/uL — ABNORMAL LOW (ref 4.22–5.81)
RDW: 14.1 % (ref 11.5–15.5)
WBC: 8.8 10*3/uL (ref 4.0–10.5)

## 2017-05-16 LAB — BASIC METABOLIC PANEL
ANION GAP: 15 (ref 5–15)
BUN: 116 mg/dL — AB (ref 6–20)
CALCIUM: 9.7 mg/dL (ref 8.9–10.3)
CO2: 27 mmol/L (ref 22–32)
Chloride: 93 mmol/L — ABNORMAL LOW (ref 101–111)
Creatinine, Ser: 4.76 mg/dL — ABNORMAL HIGH (ref 0.61–1.24)
GFR calc Af Amer: 13 mL/min — ABNORMAL LOW (ref >60)
GFR calc non Af Amer: 11 mL/min — ABNORMAL LOW (ref >60)
GLUCOSE: 117 mg/dL — AB (ref 65–99)
Potassium: 3.8 mmol/L (ref 3.5–5.1)
Sodium: 135 mmol/L (ref 135–145)

## 2017-05-16 LAB — TROPONIN I
TROPONIN I: 0.05 ng/mL — AB (ref <0.03)
Troponin I: 0.07 ng/mL (ref <0.03)

## 2017-05-16 MED ORDER — ASPIRIN 81 MG PO CHEW
81.0000 mg | CHEWABLE_TABLET | Freq: Every day | ORAL | Status: DC
  Administered 2017-05-17 – 2017-05-18 (×2): 81 mg via ORAL
  Filled 2017-05-16 (×2): qty 1

## 2017-05-16 MED ORDER — AMLODIPINE BESYLATE 5 MG PO TABS
5.0000 mg | ORAL_TABLET | Freq: Every day | ORAL | Status: DC
  Administered 2017-05-17 – 2017-05-18 (×2): 5 mg via ORAL
  Filled 2017-05-16 (×2): qty 1

## 2017-05-16 MED ORDER — NEOSTIGMINE METHYLSULFATE 5 MG/5ML IV SOSY
PREFILLED_SYRINGE | INTRAVENOUS | Status: AC
  Filled 2017-05-16: qty 5

## 2017-05-16 MED ORDER — NITROGLYCERIN 2 % TD OINT
0.5000 [in_us] | TOPICAL_OINTMENT | Freq: Three times a day (TID) | TRANSDERMAL | Status: DC
  Administered 2017-05-16 – 2017-05-17 (×3): 0.5 [in_us] via TOPICAL
  Filled 2017-05-16: qty 30

## 2017-05-16 NOTE — Progress Notes (Signed)
  Progress Note    05/16/2017 11:06 AM 1 Day Post-Op  Subjective: Having some incisional pain Has questions regarding dialysis in the future  Vitals:   05/16/17 0828 05/16/17 0936  BP: (!) 114/52 (!) 118/53  Pulse:    Resp: 15 14  Temp: (!) 97.4 F (36.3 C)   SpO2: 94% 94%    Physical Exam: Awake alert and oriented Right arm with strong fistula in the upper arm incision is clean dry and intact There is a palpable radial pulse in the right  CBC    Component Value Date/Time   WBC 8.8 05/16/2017 0445   RBC 3.02 (L) 05/16/2017 0445   HGB 9.6 (L) 05/16/2017 0445   HCT 29.1 (L) 05/16/2017 0445   PLT 473 (H) 05/16/2017 0445   MCV 96.4 05/16/2017 0445   MCH 31.8 05/16/2017 0445   MCHC 33.0 05/16/2017 0445   RDW 14.1 05/16/2017 0445   LYMPHSABS 1.2 05/16/2017 0445   MONOABS 0.6 05/16/2017 0445   EOSABS 0.4 05/16/2017 0445   BASOSABS 0.0 05/16/2017 0445    BMET    Component Value Date/Time   NA 135 05/16/2017 0445   K 3.8 05/16/2017 0445   CL 93 (L) 05/16/2017 0445   CO2 27 05/16/2017 0445   GLUCOSE 117 (H) 05/16/2017 0445   BUN 116 (H) 05/16/2017 0445   CREATININE 4.76 (H) 05/16/2017 0445   CALCIUM 9.7 05/16/2017 0445   GFRNONAA 11 (L) 05/16/2017 0445   GFRAA 13 (L) 05/16/2017 0445    INR    Component Value Date/Time   INR 1.36 05/15/2017 0646     Intake/Output Summary (Last 24 hours) at 05/16/2017 1106 Last data filed at 05/16/2017 6803 Gross per 24 hour  Intake 1240 ml  Output 1385 ml  Net -145 ml     Assessment:  72 y.o. male is s/p creation of right upper arm radiocephalic AV fistula now doing well with strong thrill and palpable radial pulse at the wrist  Plan: We will have her follow-up with Dr. Donnetta Hutching as outpatient with duplex in 4-6 weeks. Please call with questions   Romilda Proby C. Donzetta Matters, MD [facility] Office: [phone] Pager: [phone]  05/16/2017 11:06 AM

## 2017-05-16 NOTE — Progress Notes (Signed)
Hospitalist progress note   Nathan Wolfe  DGU:[id number] DOB: [birth year] DOA: 04/30/2017 PCP: Patient, No Pcp Per Specialists:   Cardiology Nephrology Vascular surgery  Brief Narrative:   72 year old male originally from [place] with Dr. Berneice Gandy [phone] Cardiology, [facility] CAD status post CABG x2+ stent, carotid stenosis status post left carotid endarterectomy 8/18, HTN, HLD, CKD 4 not on dialysis, OSA on CPAP, morbid obesity-initially admitted 05/01/2017 with left-sided chest pain after dinner and radiation to the jaw with temporary relief from nitroglycerin Recently treated for persistent cough with azithromycin at an urgent care Admitted secondary to chest pain on admission blood pressures 140s-170s heart rate within normal limits no white count creatinine 3.4-of no EKG found to have significant ST depression signs of ischemia and cardiology started on heparin drip at that time  Assessment & Plan:   Assessment:  The primary encounter diagnosis was Unstable angina ([place]). Diagnoses of Chest pain, Cough, Abnormal CXR, Hypoxia, Pulmonary edema, Respiratory distress, Constipation, Chronic renal failure, and Anxiety were also pertinent to this visit.  Unstable angina-admitted to medical floor-last cath 04/2014 Kept on aspirin Plavix placed on nitroglycerin infusion cardiology evaluation felt--high risk of nephrotoxicity with cardiac cath-troponin peak was 4.4- medical management-- placed on amlodipine ranolazine, metoprolol, long-acting nitrate On asa 81 daily  recurrent CP today 3/29-relieved by nitro, no EKG changes troponin negative -Cutting back Nitropaste half-inch from every 6 to every 8 hourly and probably will discontinue completely in a.m. 3/31 Cardiology appears to be following peripherally at this time with no plans of any further intervention for management  Sepsis secondary to flu, possible pneumonia ?-monitor--last temp 102.2 3/20 Blood culture no growth  to date and sputum culture gram-positive cocci few gram-negative rods Now has had 12 days of cefepime doxycycline-stopped Abx 3.27 D/c CT chest-complete Rx Bact PNA with no new sympt  Sinus brady probably form PO Imdur and Nitro-paste--see above  obst uropathy causing abd pain- could have been distended bladder?--see nursing noted 3/28 has foley indwelling for the time being Clamp catheter to see if sensation occurs  New onset paroxysmal atrial flutter fib-cardiology managing placed on amiodarone with some sinus bradycardia so meds adjusted-metoprolol dropped to 25 twice daily-anticoagulation ?recurrence of Afib afib--Defer to cardiology in stable No changes today  Acute/chronic diastolic heart failure-nephrology consulted given the rising creatinine from admission 63/2.9-->114/4.7, patient is net -5.5 L and weight is down from 123.5-116--->113 currently--nephrology managing diuresis Lasix IV 120 every 8 desat screen shows need for oxygen will repeat once on tele floor-has been more dyspneic over the past year  AK I superimposed on CKD stage IV-creatinine rising-see above discussion --vein mapping performed 3/27 for fistula creation dependent on wishes for dialysis versus not -No temporary access at this time -fisutla placed 3/29 RUE  Hypertension-some hypotension -current meds of amiodarone 200 daily, amlodipine 10 changed on 3/30 to 5 mg, hydralazine 75 twice daily, Imdur 120 daily, metoprolol 25 twice daily  Bipolar continue Xanax 0.5 3 times daily as needed, fluoxetine 20 daily  BPH continue Cardura 4 mg daily  OSA on CPAP at night  Anemia of chronic disease  Hyperlipidemia continue statin--discussion per cardiology  DVT prophylaxis: Lovenoex  Code Status:    ful   Family Communication:   D/w daughter extensively Disposition Plan: IP transfer to tele   Consultants:   Cardiology  Nephrology  Renal  Procedures:   Vein mapping 3/26  ECho 3/17-Study Conclusions  -  Left ventricle: The cavity size was normal. Wall thickness was   increased  in a pattern of mild LVH. Systolic function was normal.   The estimated ejection fraction was in the range of 50% to 55%.   Mild hypokinesis of the anteroseptal myocardium. Features are   consistent with a pseudonormal left ventricular filling pattern,   with concomitant abnormal relaxation and increased filling   pressure (grade 2 diastolic dysfunction). Doppler parameters are   consistent with high ventricular filling pressure. - Aortic valve: Transvalvular velocity was within the normal range.   There was no stenosis. There was no regurgitation. - Mitral valve: Transvalvular velocity was within the normal range.   There was no evidence for stenosis. There was trivial   regurgitation. - Left atrium: The atrium was severely dilated. - Right ventricle: The cavity size was normal. Wall thickness was   normal. Systolic function was normal. - Atrial septum: No defect or patent foramen ovale was identified   by color flow Doppler. - Tricuspid valve: There was mild regurgitation. - Pulmonary arteries: Systolic pressure was moderately to severely    increased. PA peak pressure: 59 mm Hg (S  Antimicrobials:   Stopped doxy and cefepime 3/27   Subjective:  Awake alert pleasant in nad No cp Some discomfort in catheter No fever no chills   Objective: Vitals:   05/16/17 0644 05/16/17 0828 05/16/17 0936 05/16/17 1148  BP:  (!) 114/52 (!) 118/53 (!) 98/48  Pulse:      Resp:  15 14 15   Temp:  (!) 97.4 F (36.3 C)  (!) 97.2 F (36.2 C)  TempSrc:  Oral  Oral  SpO2:  94% 94% 93%  Weight: 113.3 kg (249 lb 12.8 oz)     Height:        Intake/Output Summary (Last 24 hours) at 05/16/2017 1420 Last data filed at 05/16/2017 4235 Gross per 24 hour  Intake 1240 ml  Output 1385 ml  Net -145 ml   Filed Weights   05/15/17 0444 05/15/17 2100 05/16/17 0644  Weight: 112.8 kg (248 lb 9.6 oz) 115.8 kg (255 lb 4.7 oz)  113.3 kg (249 lb 12.8 oz)    Examination:  alert in nad-no jvd, no bruit bilat Thick neck, no SM lymphadenopathy RUE fistula noted,clean s1 s2 no m/r/g--sinus brady on monitors ta b no added sound no rales Grade 1 LE edema Neuro intact  Data Reviewed: I have personally reviewed following labs and imaging studies  CBC: Recent Labs  Lab 05/12/17 0309 05/13/17 0352 05/14/17 0241 05/15/17 0646 05/16/17 0445  WBC 6.6 7.2 6.8 6.6 8.8  NEUTROABS 4.5 4.7 4.5 4.2 6.5  HGB 9.6* 11.0* 10.2* 10.8* 9.6*  HCT 28.4* 32.5* 30.2* 31.9* 29.1*  MCV 93.7 94.8 94.7 95.5 96.4  PLT 430* 540* 478* 566* 361*   Basic Metabolic Panel: Recent Labs  Lab 05/12/17 0309 05/13/17 0352 05/14/17 0241 05/15/17 0646 05/16/17 0445  NA 133* 133* 134* 135 135  K 3.7 4.0 4.0 3.7 3.8  CL 92* 91* 91* 91* 93*  CO2 27 25 26 30 27   GLUCOSE 137* 119* 109* 129* 117*  BUN 107* 114* 120* 118* 116*  CREATININE 4.36* 4.72* 4.80* 4.81* 4.76*  CALCIUM 9.5 9.9 9.7 10.2 9.7  PHOS  --   --   --  4.8*  --    GFR: Estimated Creatinine Clearance: 16.6 mL/min (A) (by C-G formula based on SCr of 4.76 mg/dL (H)). Liver Function Tests: Recent Labs  Lab 05/11/17 1432 05/15/17 0646  ALBUMIN 3.2* 3.4*   No results for input(s): LIPASE, AMYLASE in  the last 168 hours. No results for input(s): AMMONIA in the last 168 hours. Coagulation Profile: Recent Labs  Lab 05/15/17 0646  INR 1.36   Cardiac Enzymes: Recent Labs  Lab 05/15/17 1753 05/15/17 2331 05/16/17 0445  TROPONINI 0.05* 0.05* 0.07*   CBG: No results for input(s): GLUCAP in the last 168 hours. Urine analysis: No results found for: COLORURINE, APPEARANCEUR, LABSPEC, [place], GLUCOSEU, [place], [place], [place], [place], [place], NITRITE, [employer]   Radiology Studies: Reviewed images personally in health database    Scheduled Meds: . allopurinol  100 mg Oral Daily  . amiodarone  200 mg Oral Daily  . amLODipine  10 mg Oral Daily   . aspirin  325 mg Oral Daily  . brimonidine  1 drop Both Eyes QODAY  . chlorhexidine  15 mL Mouth Rinse BID  . clopidogrel  75 mg Oral Daily  . doxazosin  4 mg Oral Daily  . enoxaparin (LOVENOX) injection  30 mg Subcutaneous Q24H  . ezetimibe  10 mg Oral Daily  . FLUoxetine  20 mg Oral Daily  . hydrALAZINE  75 mg Oral BID  . isosorbide mononitrate  120 mg Oral Daily  . mouth rinse  15 mL Mouth Rinse q12n4p  . Melatonin  3 mg Oral QHS  . metoprolol tartrate  25 mg Oral BID  . nitroGLYCERIN  0.5 inch Topical Q6H  . polyethylene glycol  17 g Oral BID  . ranolazine  500 mg Oral BID  . senna-docusate  1 tablet Oral BID   Continuous Infusions: . furosemide Stopped (05/16/17 0646)     LOS: 15 days    Time spent: [place], MD [facility]   If 7PM-7AM, please contact night-coverage [email or web address] Password [email or web address] 05/16/2017, 2:20 PM

## 2017-05-16 NOTE — Progress Notes (Signed)
Foley clamped for 3.5 hrs. Patient did not feel the need to void. Unclamped and 250cc urine emptied.

## 2017-05-16 NOTE — Progress Notes (Signed)
[facility] Progress Note   72 y.o.year-old with hx of CAD hx CABG 2000 and redo 2011, sp L CEA, CKD stage IV,obesity , OSA on CPAP admitted on 05/01/17 with c/o L sided chest pain. Was admitted to SDU for unstable angina. Seen by cardiology, hx complex anatomy, CABG and redo CABG, they recommended medical Rx w IV NTG and heparin. Trop peaked at 4.49 EF of 50-55%, Grade 2 DD   Creat was 2.8on admission and is now up to 4.1 Great diuresis with a weight that has decreased to 259 lbs from 273 to 249 lbs  He developed flu and pneumonia and was treated with antibiotics - cefepime and doxyclycline  He sees [place] doctor in [place], he is up visting family here with his wife. His wife is a dialysis patient.Appreciate help from Dr Donnetta Hutching   Assessment/ Plan:   1 Acute on CKD IV - suspect due to decompCHF. Looks better today, getting OOB. Seems to be diuresing well. Suspect renal function will level off soon. No indication for RRT.  Renal ultrasound simple cysts  - hopefully we can avoid initiating dialysis during this hospitalization; no absolute need for HD todaybut renal function not improving yet (basically same as yesterday). I explained to the family that it wouldn't be wrong to initiate dialysis but they prefer to wait.  - He will need very close f/u (ie q2week checks with [facility]) as I anticipate him starting on dialysis in the near future.  - still has edema and dyspnea. - PVR significant for >844ml --> Foley              - over 4000 mL UOP /1st 24hrs -> net -770/24hrs - On Lasix 120mg  IV q8hr --> may be able to back off if there was a significant post obstructive cause  2CKD stage IV/V - baseline creat 2.8 - 3.5 per family, f/b nephrology in [facility]. Get  3 Chest pain / unstable angina - on admit 3/15, better now 4 HTN - on 4 bp meds here as at home, may need to reduce if pt starts to diurese 5 CAD hx CABG 2000/ redo CABG 2011 - EF 50-55 % by echo  here 6 HL 7. AnemiaHemoglobin 10 Tsats 20%     Subjective:   Still dyspneic but markedly improved from admission. Denies any f/c/n/v overnight. Diuresing nicely; significant PVR by bladder scan yesterday with >834mL   Objective:   BP (!) 118/53   Pulse (!) 58   Temp (!) 97.4 F (36.3 C) (Oral)   Resp 14   Ht 5\' 7"  (1.702 m)   Wt 113.3 kg (249 lb 12.8 oz)   SpO2 94%   BMI 39.12 kg/m   Intake/Output Summary (Last 24 hours) at 05/16/2017 1119 Last data filed at 05/16/2017 8786 Gross per 24 hour  Intake 1240 ml  Output 1385 ml  Net -145 ml   Weight change: 2.129 kg (4 lb 11.1 oz)  Physical Exam: GEN - NAD, very pleasasnt CVS- RRR RS- CTA ABD- BS present soft non-distended EXT-tr - 1+edema ACCESS - good bruit in rt BCF   Imaging: No results found.  Labs: BMET Recent Labs  Lab 05/10/17 0259 05/11/17 1109 05/12/17 0309 05/13/17 0352 05/14/17 0241 05/15/17 0646 05/16/17 0445  NA 133* 131* 133* 133* 134* 135 135  K 4.2 4.5 3.7 4.0 4.0 3.7 3.8  CL 95* 93* 92* 91* 91* 91* 93*  CO2 24 19* 27 25 26 30 27   GLUCOSE 124* 158* 137*  119* 109* 129* 117*  BUN 91* 106* 107* 114* 120* 118* 116*  CREATININE 4.13* 4.30* 4.36* 4.72* 4.80* 4.81* 4.76*  CALCIUM 9.6 9.8 9.5 9.9 9.7 10.2 9.7  PHOS  --   --   --   --   --  4.8*  --    CBC Recent Labs  Lab 05/13/17 0352 05/14/17 0241 05/15/17 0646 05/16/17 0445  WBC 7.2 6.8 6.6 8.8  NEUTROABS 4.7 4.5 4.2 6.5  HGB 11.0* 10.2* 10.8* 9.6*  HCT 32.5* 30.2* 31.9* 29.1*  MCV 94.8 94.7 95.5 96.4  PLT 540* 478* 566* 473*    Medications:    . allopurinol  100 mg Oral Daily  . amiodarone  200 mg Oral Daily  . amLODipine  10 mg Oral Daily  . aspirin  325 mg Oral Daily  . brimonidine  1 drop Both Eyes QODAY  . chlorhexidine  15 mL Mouth Rinse BID  . clopidogrel  75 mg Oral Daily  . doxazosin  4 mg Oral Daily  . enoxaparin (LOVENOX) injection  30 mg Subcutaneous Q24H  . ezetimibe  10 mg Oral Daily  . FLUoxetine   20 mg Oral Daily  . hydrALAZINE  75 mg Oral BID  . isosorbide mononitrate  120 mg Oral Daily  . mouth rinse  15 mL Mouth Rinse q12n4p  . Melatonin  3 mg Oral QHS  . metoprolol tartrate  25 mg Oral BID  . nitroGLYCERIN  0.5 inch Topical Q6H  . polyethylene glycol  17 g Oral BID  . ranolazine  500 mg Oral BID  . senna-docusate  1 tablet Oral BID      Otelia Santee, MD 05/16/2017, 11:19 AM

## 2017-05-17 LAB — CBC WITH DIFFERENTIAL/PLATELET
BASOS PCT: 0 %
Basophils Absolute: 0 10*3/uL (ref 0.0–0.1)
Eosinophils Absolute: 0.4 10*3/uL (ref 0.0–0.7)
Eosinophils Relative: 6 %
HEMATOCRIT: 29.4 % — AB (ref 39.0–52.0)
HEMOGLOBIN: 9.8 g/dL — AB (ref 13.0–17.0)
LYMPHS ABS: 1.4 10*3/uL (ref 0.7–4.0)
LYMPHS PCT: 18 %
MCH: 31.9 pg (ref 26.0–34.0)
MCHC: 33.3 g/dL (ref 30.0–36.0)
MCV: 95.8 fL (ref 78.0–100.0)
MONO ABS: 0.7 10*3/uL (ref 0.1–1.0)
MONOS PCT: 9 %
NEUTROS ABS: 5.3 10*3/uL (ref 1.7–7.7)
NEUTROS PCT: 67 %
Platelets: 444 10*3/uL — ABNORMAL HIGH (ref 150–400)
RBC: 3.07 MIL/uL — ABNORMAL LOW (ref 4.22–5.81)
RDW: 14.1 % (ref 11.5–15.5)
WBC: 7.8 10*3/uL (ref 4.0–10.5)

## 2017-05-17 LAB — RENAL FUNCTION PANEL
ANION GAP: 14 (ref 5–15)
Albumin: 3.3 g/dL — ABNORMAL LOW (ref 3.5–5.0)
BUN: 116 mg/dL — ABNORMAL HIGH (ref 6–20)
CHLORIDE: 92 mmol/L — AB (ref 101–111)
CO2: 28 mmol/L (ref 22–32)
Calcium: 9.5 mg/dL (ref 8.9–10.3)
Creatinine, Ser: 4.85 mg/dL — ABNORMAL HIGH (ref 0.61–1.24)
GFR calc non Af Amer: 11 mL/min — ABNORMAL LOW (ref >60)
GFR, EST AFRICAN AMERICAN: 12 mL/min — AB (ref >60)
GLUCOSE: 110 mg/dL — AB (ref 65–99)
Phosphorus: 4.8 mg/dL — ABNORMAL HIGH (ref 2.5–4.6)
Potassium: 3.9 mmol/L (ref 3.5–5.1)
SODIUM: 134 mmol/L — AB (ref 135–145)

## 2017-05-17 MED ORDER — TAMSULOSIN HCL 0.4 MG PO CAPS
0.4000 mg | ORAL_CAPSULE | Freq: Every day | ORAL | Status: DC
  Administered 2017-05-17 – 2017-05-18 (×2): 0.4 mg via ORAL
  Filled 2017-05-17 (×2): qty 1

## 2017-05-17 MED ORDER — TORSEMIDE 20 MG PO TABS
50.0000 mg | ORAL_TABLET | Freq: Every day | ORAL | Status: DC
  Administered 2017-05-17 – 2017-05-18 (×2): 50 mg via ORAL
  Filled 2017-05-17 (×2): qty 3

## 2017-05-17 NOTE — Progress Notes (Signed)
Patient ambulated in hallway with family. Will monitor patient. Maryfer Tauzin, Bettina Gavia RN

## 2017-05-17 NOTE — Progress Notes (Signed)
[facility] Progress Note   72 y.o.year-old with hx of CAD hx CABG 2000 and redo 2011, sp L CEA, CKD stage IV,obesity , OSA on CPAP admitted on 05/01/17 with c/o L sided chest pain. Was admitted to SDU for unstable angina. Seen by cardiology, hx complex anatomy, CABG and redo CABG, they recommended medical Rx w IV NTG and heparin. Trop peaked at 4.49 EF of 50-55%, Grade 2 DD   Creat was 2.8on admission and is now up to 4.1 Great diuresis with a weight that has decreased to 259 lbs from 273 to 249 lbs  He developed flu and pneumonia and was treated with antibiotics - cefepime and doxyclycline  He sees [place] doctor in [place], he is up visting family here with his wife. His wife is a dialysis patient.Appreciate help from Dr Donnetta Hutching   Assessment/ Plan:   1 Acute on CKD IV - suspect due to decompCHF. Looks better today, getting OOB. Seems to be diuresing well. Suspect renal function will level off soon. No indication for RRT. - PVR significant for >883ml -->Foley  - over 4000 mL UOP /1st 24hrs -> net -770/24hrs - Unfortunately, even with a significant post obstructive component the renal function has not improved with a foley.  Renal ultrasound simple cysts  - hopefully we can avoid initiating dialysis during this hospitalization; no absolute need for HD todaybutrenal functionnot improving  (basically same). I explained to the family that it wouldn't be wrong to initiate dialysis but they prefer to wait. Wife who is on dialysis was bedside for the 1st time today.   - He will need very close f/u (ie q2week checks with [facility]) as I anticipate him starting on dialysis in the near future.  - I also educated him on signs to look for.  - stop the iv lasix in anticipation of DC. Start demadex 50mg  PO daily; educate pt to incr to 100mg  daily if daily weights are increasing.   2CKD stage IV/V - baseline creat 2.8 - 3.5 per family, f/b nephrology in  [facility].  3 Chest pain / unstable angina - on admit 3/15, better now 4 HTN - on 4 bp meds here as at home, may need to reduce if pt starts to diurese 5 CAD hx CABG 2000/ redo CABG 2011 - EF 50-55 % by echo here 6 HL 7. AnemiaHemoglobin 10 Tsats 20%    Subjective:   Still mildly dyspneic but much improved c/w admission. Denies f/c/n/v. Appetite not great but he thinks it's bec of the hospital food.   Objective:   BP (!) 131/54   Pulse 62   Temp (!) 97.5 F (36.4 C) (Oral)   Resp 18   Ht 5\' 7"  (1.702 m)   Wt 113.7 kg (250 lb 9.6 oz)   SpO2 96%   BMI 39.25 kg/m   Intake/Output Summary (Last 24 hours) at 05/17/2017 1525 Last data filed at 05/17/2017 1430 Gross per 24 hour  Intake 736 ml  Output 1750 ml  Net -1014 ml   Weight change: -2.129 kg (-4 lb 11.1 oz)  Physical Exam: GEN - NAD, very pleasasnt CVS- RRR RS- CTA ABD- BS present soft non-distended EXT-tr - 1+edema ACCESS - good bruit in rt BCF     Imaging: No results found.  Labs: BMET Recent Labs  Lab 05/11/17 1109 05/12/17 0309 05/13/17 0352 05/14/17 0241 05/15/17 0646 05/16/17 0445 05/17/17 0254  NA 131* 133* 133* 134* 135 135 134*  K 4.5 3.7 4.0 4.0 3.7  3.8 3.9  CL 93* 92* 91* 91* 91* 93* 92*  CO2 19* 27 25 26 30 27 28   GLUCOSE 158* 137* 119* 109* 129* 117* 110*  BUN 106* 107* 114* 120* 118* 116* 116*  CREATININE 4.30* 4.36* 4.72* 4.80* 4.81* 4.76* 4.85*  CALCIUM 9.8 9.5 9.9 9.7 10.2 9.7 9.5  PHOS  --   --   --   --  4.8*  --  4.8*   CBC Recent Labs  Lab 05/14/17 0241 05/15/17 0646 05/16/17 0445 05/17/17 0254  WBC 6.8 6.6 8.8 7.8  NEUTROABS 4.5 4.2 6.5 5.3  HGB 10.2* 10.8* 9.6* 9.8*  HCT 30.2* 31.9* 29.1* 29.4*  MCV 94.7 95.5 96.4 95.8  PLT 478* 566* 473* 444*    Medications:    . allopurinol  100 mg Oral Daily  . amiodarone  200 mg Oral Daily  . amLODipine  5 mg Oral Daily  . aspirin  81 mg Oral Daily  . brimonidine  1 drop Both Eyes QODAY  . chlorhexidine  15 mL  Mouth Rinse BID  . clopidogrel  75 mg Oral Daily  . doxazosin  4 mg Oral Daily  . enoxaparin (LOVENOX) injection  30 mg Subcutaneous Q24H  . ezetimibe  10 mg Oral Daily  . FLUoxetine  20 mg Oral Daily  . hydrALAZINE  75 mg Oral BID  . isosorbide mononitrate  120 mg Oral Daily  . mouth rinse  15 mL Mouth Rinse q12n4p  . Melatonin  3 mg Oral QHS  . metoprolol tartrate  25 mg Oral BID  . polyethylene glycol  17 g Oral BID  . ranolazine  500 mg Oral BID  . senna-docusate  1 tablet Oral BID  . tamsulosin  0.4 mg Oral QPC breakfast      Otelia Santee, MD 05/17/2017, 3:25 PM

## 2017-05-17 NOTE — Progress Notes (Signed)
Hospitalist progress note   Nathan Wolfe  XLK:[id number] DOB: 1945/12/04 DOA: 04/30/2017 PCP: Patient, No Pcp Per Specialists:   Cardiology Nephrology Vascular surgery  Brief Narrative:   72 year old male originally from [name] with Dr. 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 Diagnoses of Chest pain, Cough, Abnormal CXR, Hypoxia, Pulmonary edema, Respiratory distress, Constipation, Chronic renal failure, and Anxiety were also pertinent to this visit.  Unstable angina-admitted to medical floor-last cath 04/2014 Kept on aspirin Plavix placed on nitroglycerin infusion cardiology evaluation felt--high risk of nephrotoxicity with cardiac cath-troponin peak was 4.4- medical management--placed on amlodipine ranolazine, metoprolol, long-acting nitrate On asa 81 daily  recurrent CP today 3/29-relieved by nitro, no EKG changes troponin negative -Discontinued completely Nitropaste 3/31 expect no further recurrence but will monitor overnight today Cardiology appears to be following peripherally at this time with no plans of any further intervention for management  Sepsis secondary to flu, possible pneumonia ?-monitor--last temp 102.2 3/20 Blood culture no growth to date and  sputum culture gram-positive cocci few gram-negative rods Now has had 12 days of cefepime doxycycline-stopped Abx 3.27 D/c CT chest-complete Rx Bact PNA with no new sympt  Sinus brady probably form PO Imdur and Nitro-paste--see above  obst uropathy causing abd pain- could have been distended bladder?--see nursing noted 3/28 has foley indwelling for the time being Clamp catheter but no sensation so will need outpatient urology visit and have left in [facility] follow-up in 4 Started Flomax 3:31 AM  New onset paroxysmal atrial flutter fib-cardiology managing placed on amiodarone with some sinus bradycardia so meds adjusted-metoprolol dropped to 25 twice daily-anticoagulation ?recurrence of Afib afib--Defer to cardiology in stable On monitors no recurrent A. fib and just sinus bradycardia so will cut back dose of amiodarone keep dose of amiodarone at 200 and plan for transitional visit as an outpatient as he does not seem to be planning on going back to [place] right away  Acute/chronic diastolic heart failure-nephrology consulted given the rising creatinine from admission 63/2.9-->114/4.7, patient is net -5.5 L and weight is down from 123.5-116--->113 currently--nephrology recommendations as below for discharge Repeat desaturation screen in a.m. Nephrology feels Demadex 50 mg daily appropriate and if gains more than 2 pounds in 24 hours time take an extra 50 mg dose  AK I superimposed on CKD stage IV-creatinine rising-see above discussion --vein mapping performed 3/27 for fistula creation dependent on wishes for dialysis versus not -No temporary access at this time -fisutla placed 3/29 RUE  Left breast pain Will need outpatient management and follow-up if this is recurrent  Hypertension-some hypotension -current meds of amiodarone 200 daily, amlodipine 10 changed on 3/30 to 5 mg, hydralazine 75 twice daily, Imdur 120 daily, metoprolol 25 twice daily  Bipolar continue Xanax 0.5 3  times daily as needed, fluoxetine 20 daily  BPH continue Cardura 4 mg daily  OSA on  CPAP at night  Anemia of chronic disease  Hyperlipidemia continue statin--discussion per cardiology  DVT prophylaxis: Lovenoex  Code Status:    ful   Family Communication:   D/w patient wife disposition Plan: IP transfer to tele and possible discharge home if no chest pain and stable from hemodynamic standpoint   Consultants:   Cardiology  Nephrology  Renal  Procedures:   Vein mapping 3/26  ECho 3/17-Study Conclusions  - Left ventricle: The cavity size was normal. Wall thickness was   increased in a pattern of mild LVH. Systolic function was normal.   The estimated ejection fraction was in the range of 50% to 55%.   Mild hypokinesis of the anteroseptal myocardium. Features are   consistent with a pseudonormal left ventricular filling pattern,   with concomitant abnormal relaxation and increased filling   pressure (grade 2 diastolic dysfunction). Doppler parameters are   consistent with high ventricular filling pressure. - Aortic valve: Transvalvular velocity was within the normal range.   There was no stenosis. There was no regurgitation. - Mitral valve: Transvalvular velocity was within the normal range.   There was no evidence for stenosis. There was trivial   regurgitation. - Left atrium: The atrium was severely dilated. - Right ventricle: The cavity size was normal. Wall thickness was   normal. Systolic function was normal. - Atrial septum: No defect or patent foramen ovale was identified   by color flow Doppler. - Tricuspid valve: There was mild regurgitation. - Pulmonary arteries: Systolic pressure was moderately to severely    increased. PA peak pressure: 59 mm Hg (S  Antimicrobials:   Stopped doxy and cefepime 3/27   Subjective:  Much better walking halls not using oxygen no chest pain Passing good urine No nausea no vomiting    Objective: Vitals:   05/16/17  2336 05/17/17 0527 05/17/17 0816 05/17/17 1059  BP: (!) 117/51 138/61 119/62 (!) 131/54  Pulse: 60   62  Resp: 20 18 18    Temp: 98.2 F (36.8 C) 98 F (36.7 C) (!) 97.5 F (36.4 C)   TempSrc: Oral Oral Oral   SpO2: 92% 98% 95% 96%  Weight:  113.7 kg (250 lb 9.6 oz)    Height:        Intake/Output Summary (Last 24 hours) at 05/17/2017 1531 Last data filed at 05/17/2017 1430 Gross per 24 hour  Intake 736 ml  Output 1750 ml  Net -1014 ml   Filed Weights   05/15/17 2100 05/16/17 0644 05/17/17 0527  Weight: 115.8 kg (255 lb 4.7 oz) 113.3 kg (249 lb 12.8 oz) 113.7 kg (250 lb 9.6 oz)    Examination:  alert in nad-no jvd, no bruit bilat Thick neck, no SM lymphadenopathy RUE fistula noted,clean s1 s2 no m/r/g--sinus brady on monitors without any A. fib recurrence Chest clear no added sound left breast pain Grade 1 LE edema Neuro intact  Data Reviewed: I have personally reviewed following labs and imaging studies  CBC: Recent Labs  Lab 05/13/17 0352 05/14/17 0241 05/15/17 0646 05/16/17 0445 05/17/17 0254  WBC 7.2 6.8 6.6 8.8 7.8  NEUTROABS 4.7 4.5 4.2 6.5 5.3  HGB 11.0* 10.2* 10.8* 9.6* 9.8*  HCT 32.5* 30.2* 31.9* 29.1* 29.4*  MCV 94.8 94.7 95.5 96.4 95.8  PLT 540* 478* 566* 473* 967*   Basic Metabolic Panel: Recent Labs  Lab 05/13/17 0352 05/14/17 0241 05/15/17 0646 05/16/17 0445 05/17/17 0254  NA 133* 134* 135 135 134*  K 4.0 4.0 3.7 3.8 3.9  CL 91* 91* 91* 93* 92*  CO2 25 26 30 27 28   GLUCOSE 119* 109* 129* 117* 110*  BUN 114* 120* 118* 116* 116*  CREATININE 4.72* 4.80* 4.81* 4.76* 4.85*  CALCIUM 9.9 9.7 10.2 9.7 9.5  PHOS  --   --  4.8*  --  4.8*   GFR: Estimated Creatinine Clearance: 16.3 mL/min (A) (by C-G formula based on SCr of 4.85 mg/dL (H)). Liver Function Tests: Recent Labs  Lab 05/11/17 1432 05/15/17 0646 05/17/17 0254  ALBUMIN 3.2* 3.4* 3.3*   No results for input(s): LIPASE, AMYLASE in the last 168 hours. No results for input(s):  AMMONIA in the last 168 hours. Coagulation Profile: Recent Labs  Lab 05/15/17 0646  INR 1.36   Cardiac Enzymes: Recent Labs  Lab 05/15/17 1753 05/15/17 2331 05/16/17 0445  TROPONINI 0.05* 0.05* 0.07*   CBG: No results for input(s): GLUCAP in the last 168 hours. Urine analysis: No results found for: COLORURINE, APPEARANCEUR, LABSPEC, [place], GLUCOSEU, [place], [place], [place], [place], [place], NITRITE, [place]   Radiology Studies: Reviewed images personally in health database    Scheduled Meds: . allopurinol  100 mg Oral Daily  . amiodarone  200 mg Oral Daily  . amLODipine  5 mg Oral Daily  . aspirin  81 mg Oral Daily  . brimonidine  1 drop Both Eyes QODAY  . chlorhexidine  15 mL Mouth Rinse BID  . clopidogrel  75 mg Oral Daily  . doxazosin  4 mg Oral Daily  . enoxaparin (LOVENOX) injection  30 mg Subcutaneous Q24H  . ezetimibe  10 mg Oral Daily  . FLUoxetine  20 mg Oral Daily  . hydrALAZINE  75 mg Oral BID  . isosorbide mononitrate  120 mg Oral Daily  . mouth rinse  15 mL Mouth Rinse q12n4p  . Melatonin  3 mg Oral QHS  . metoprolol tartrate  25 mg Oral BID  . polyethylene glycol  17 g Oral BID  . ranolazine  500 mg Oral BID  . senna-docusate  1 tablet Oral BID  . tamsulosin  0.4 mg Oral QPC breakfast  . torsemide  50 mg Oral Daily   Continuous Infusions:    LOS: 16 days  Time spent: [place], MD [facility] ([phone]  If 7PM-7AM, please contact night-coverage [email or web address] Password [facility] 05/17/2017, 3:31 PM

## 2017-05-17 NOTE — Care Management Note (Signed)
Case Management Note Marvetta Gibbons RN, BSN Unit 4E-Case Manager- [place] coverage [phone]  Patient Details  Name: Nathan Wolfe MRN: [id number] Date of Birth: [birth year]  Subjective/Objective:   Pt admitted with USA/STEMI, +Flu A/CAP                 Action/Plan: PTA pt lived at home with spouse, CM to follow for transition of care needs.   Expected Discharge Date:                  Expected Discharge Plan:  [name]  In-House Referral:  Hospice / Palliative Care  Discharge planning Services  CM Consult  Post Acute Care Choice:  Durable Medical Equipment, Home Health Choice offered to:  Patient, Adult Children, Spouse  DME Arranged:  3-N-1, Walker rolling DME Agency:  [place]:    [place] Agency:     Status of Service:  In process, will continue to follow  If discussed at Long Length of Stay Meetings, dates discussed:    Discharge Disposition:   Additional Comments:  05/17/17- 1400- Marvetta Gibbons RN, CM- pt progressing well- tx to 4E- has had AV fistula placed for future HD. F/U done at the bedside with pt/wife/daughter for transition of care needs- per conversation plan is for pt to stay here with daughter to recover prior to returning to [facility]. Discussed HH needs and DME- pt would need a 3n1 and RW and would benefit from HHRN/PT/OT- will ask MD for orders prior to discharge- have provided pt and family a list of [place] agencies for [facility] to review- and CM will f/u for choice to make referrals to prior to discharge. - Pt is on RW however family asking about home 02- explained to pt and family that pt would have to qualify under Medicare guidelines in order for insurance to cover home 02. - CM will continue to follow for transition needs.   05/08/17- 1400- Calin Fantroy RN, CM-  Pt tx back to ICU for further tx- is here visiting daughter from [place] he and his wife both got sick with flu- per daughter wife is to d/c home  today from hospital- pt has more complicated stay with heart and renal issues. Pt and wife live in [place], have 2 daughters here in [place], one here local and one in [place]. Per conversation with daughter whom pt was staying with here- pt and wife would prefer to get back to [facility]- however understand pt may need more time to recover prior to traveling back safely to [place]. They are discussing the options of home with daughter and HH vs possible STSNF here- also want to have discussion with PC (consult is pending) to talk about PC vs hospice. Daughter is very realalistic and wants to do what her parents want but also what is best for her dad. Explained to daughter that CM would follow for transition needs as pt improved - provided daughter with CM contact info. Pt will need PT/OT evals when medically ready to assist with recommendations for discharge.   Dawayne Patricia, RN 05/17/2017, 2:16 PM

## 2017-05-18 ENCOUNTER — Encounter: Payer: Self-pay | Admitting: Family Medicine

## 2017-05-18 ENCOUNTER — Telehealth: Payer: Self-pay | Admitting: Family Medicine

## 2017-05-18 LAB — GLUCOSE, CAPILLARY: GLUCOSE-CAPILLARY: 110 mg/dL — AB (ref 65–99)

## 2017-05-18 LAB — RENAL FUNCTION PANEL
Albumin: 3 g/dL — ABNORMAL LOW (ref 3.5–5.0)
Anion gap: 13 (ref 5–15)
BUN: 110 mg/dL — AB (ref 6–20)
CHLORIDE: 92 mmol/L — AB (ref 101–111)
CO2: 30 mmol/L (ref 22–32)
CREATININE: 4.72 mg/dL — AB (ref 0.61–1.24)
Calcium: 9.6 mg/dL (ref 8.9–10.3)
GFR calc Af Amer: 13 mL/min — ABNORMAL LOW (ref >60)
GFR, EST NON AFRICAN AMERICAN: 11 mL/min — AB (ref >60)
GLUCOSE: 109 mg/dL — AB (ref 65–99)
POTASSIUM: 3.6 mmol/L (ref 3.5–5.1)
Phosphorus: 4.5 mg/dL (ref 2.5–4.6)
Sodium: 135 mmol/L (ref 135–145)

## 2017-05-18 MED ORDER — RANOLAZINE ER 500 MG PO TB12: 500.0000 mg | ORAL_TABLET | Freq: Two times a day (BID) | ORAL | 0 refills | Status: DC

## 2017-05-18 MED ORDER — AMIODARONE HCL 200 MG PO TABS: 200.0000 mg | ORAL_TABLET | Freq: Every day | ORAL | 0 refills | Status: DC

## 2017-05-18 MED ORDER — TORSEMIDE 100 MG PO TABS: 50.0000 mg | ORAL_TABLET | Freq: Every day | ORAL | 0 refills | Status: DC

## 2017-05-18 MED ORDER — ISOSORBIDE MONONITRATE ER 120 MG PO TB24: 120.0000 mg | ORAL_TABLET | Freq: Every day | ORAL | 0 refills | Status: DC

## 2017-05-18 MED ORDER — TAMSULOSIN HCL 0.4 MG PO CAPS: 0.4000 mg | ORAL_CAPSULE | Freq: Every day | ORAL | 0 refills | Status: AC

## 2017-05-18 MED ORDER — METOPROLOL TARTRATE 50 MG PO TABS: 25.0000 mg | ORAL_TABLET | Freq: Two times a day (BID) | ORAL | 0 refills | Status: AC

## 2017-05-18 MED ORDER — POLYETHYLENE GLYCOL 3350 17 G PO PACK: 17.0000 g | PACK | Freq: Two times a day (BID) | ORAL | 0 refills | Status: AC

## 2017-05-18 MED ORDER — HYDRALAZINE HCL 25 MG PO TABS: 75.0000 mg | ORAL_TABLET | Freq: Two times a day (BID) | ORAL | 0 refills | Status: DC

## 2017-05-18 MED ORDER — ALPRAZOLAM 0.5 MG PO TABS: 0.5000 mg | ORAL_TABLET | Freq: Three times a day (TID) | ORAL | 0 refills | Status: AC | PRN

## 2017-05-18 NOTE — Evaluation (Signed)
Physical Therapy Evaluation Patient Details Name: Nathan Wolfe MRN: [id number] DOB: [birth year] Today's Date: 05/18/2017   History of Present Illness  Pt is a 72 y.o. male admitted 04/30/08 with chest pain not relieved by NG; worked up for STEMI with elevated troponins likely secondary to demand ischemia from influenza pneumonia and HF exacerbation with underlying worsening CKD. Worsening kidney function and may require HD. Tentatively scheduled him for RUE AV fistula on 3/29. 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   Clinical Impression  Performed re-evaluation with focus on stair navigation as per MD verbal request this afternoon; patient received up in chair this afternoon, very pleasant and willing to participate in PT, daughter present during session. He continues to be able to perform functional transfers and gait with S-Mod(I) and RW, and was able to navigate approximately 14 steps today. He required multiple rest breaks on stair ascent even with step to pattern, as well as a 3-5 minute rest break at the top of the stairs prior to descent, and then was able to perform descent of stair flight with Min guard and step to pattern and no rest breaks. He continues to remain appropriate for skilled HHPT services moving forward. Daughter reports that they are anticipating possible discharge to her home today. Patient has met all PT goals from prior course of care and is regularly ambulating with family and nursing staff in the hall; PT signing off for now, thank you for the referral.   Follow Up Recommendations Home health PT;Supervision/Assistance - 24 hour    Equipment Recommendations  Rolling walker with 5" wheels    Recommendations for Other Services       Precautions / Restrictions Precautions Precautions: Fall Precaution Comments: watch O2 Sats Restrictions Weight Bearing  Restrictions: No      Mobility  Bed Mobility               General bed mobility comments: DNT, received up in chair   Transfers Overall transfer level: Modified independent Equipment used: Rolling walker (2 wheeled) Transfers: Sit to/from Stand Sit to Stand: Modified independent (Device/Increase time)         General transfer comment: vc for hand placement  Ambulation/Gait Ambulation/Gait assistance: Supervision Ambulation Distance (Feet): 250 Feet Assistive device: Rolling walker (2 wheeled) Gait Pattern/deviations: Step-through pattern;Decreased stride length;Trunk flexed     General Gait Details: S for ambulation in room with RW, cues for increased step length and increased posture   Stairs Stairs: Yes Stairs assistance: Min guard Stair Management: One rail Left Number of Stairs: 14 General stair comments: able to perform safely with min guard and U UE support, required multiple rest breaks with stair ascent, able to complete flight without rest break on descent; 3-5 minute rest break between ascent and descent   Wheelchair Mobility    Modified Rankin (Stroke Patients Only)       Balance Overall balance assessment: Needs assistance Sitting-balance support: Feet supported;No upper extremity supported Sitting balance-[name]: Good     Standing balance support: During functional activity;Single extremity supported Standing balance-[name]: Fair Standing balance comment: Can static stand with no UE support; dynamic stability improved with BUE support                             Pertinent Vitals/Pain Pain Assessment: No/denies pain  Home Living Family/patient expects to be discharged to:: Private residence Living Arrangements: Spouse/significant other Available Help at Discharge: Family;Available 24 hours/day Type of Home: House(townhome ) Home Access: Level entry     Home Layout: Two level;Able to live on main level with  bedroom/bathroom Home Equipment: Shower seat - built in Additional Comments: Plan is to DC to daughter's house until he is able to travel home. level entry; 1 level; walk in ; bathroom RW accessible    Prior Function Level of Independence: Independent         Comments: sport pilot; drives motorcycles     Hand Dominance   Dominant Hand: Left    Extremity/Trunk Assessment   Upper Extremity Assessment Upper Extremity Assessment: Defer to OT evaluation    Lower Extremity Assessment Lower Extremity Assessment: Generalized weakness    Cervical / Trunk Assessment Cervical / Trunk Assessment: Other exceptions Cervical / Trunk Exceptions: Forward head posture with rounded shoulders  Communication   Communication: No difficulties  Cognition Arousal/Alertness: Awake/alert Behavior During Therapy: WFL for tasks assessed/performed Overall Cognitive Status: Within Functional Limits for tasks assessed                                 General Comments: H/o depression/anxiety per daughter. WFL this session      General Comments      Exercises     Assessment/Plan    PT Assessment All further PT needs can be met in the next venue of care  PT Problem List Decreased strength;Decreased range of motion;Decreased activity tolerance;Decreased balance;Decreased mobility;Decreased knowledge of use of DME;Decreased safety awareness;Decreased knowledge of precautions;Cardiopulmonary status limiting activity       PT Treatment Interventions DME instruction;Gait training;Stair training;Functional mobility training;Therapeutic activities;Therapeutic exercise;Neuromuscular re-education;Patient/family education    PT Goals (Current goals can be found in the Care Plan section)  Acute Rehab PT Goals Patient Stated Goal: Home to daughter's house at d/c. 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 : None Difficulty sitting down on and standing up from a chair with arms (e.g., wheelchair, bedside commode, etc,.)?: None Help needed moving to and from a bed to chair (including a wheelchair)?: A Little Help needed walking in hospital room?: A Little Help needed climbing 3-5 steps with a railing? : A Little 6 Click Score: 21    End of Session Equipment Utilized During Treatment: Gait belt Activity Tolerance: Patient tolerated treatment well Patient left: in chair;with call bell/phone within reach;with family/visitor present   PT Visit Diagnosis: Unsteadiness on feet (R26.81);Difficulty in walking, not elsewhere classified (R26.2)    Time: 5686-1683 PT Time Calculation (min) (ACUTE ONLY): 28 min   Charges:   PT Evaluation $PT Re-evaluation: 1 Re-eval PT Treatments $Gait Training: 8-22 mins   PT G Codes:        Deniece Ree PT, DPT, CBIS  Supplemental Physical Therapist [place]   Pager [phone]

## 2017-05-18 NOTE — Progress Notes (Signed)
S: Feels better and would like to go home with his daughter today. O:BP (!) 117/49 (BP Location: Left Arm)   Pulse (!) 55   Temp 98.1 F (36.7 C) (Oral)   Resp 17   Ht 5\' 7"  (1.702 m)   Wt 113.4 kg (250 lb)   SpO2 97%   BMI 39.16 kg/m   Intake/Output Summary (Last 24 hours) at 05/18/2017 1250 Last data filed at 05/18/2017 0900 Gross per 24 hour  Intake 302 ml  Output 1875 ml  Net -1573 ml   Intake/Output: I/O last 3 completed shifts: In: 736 [P.O.:540; Other:10; IV Piggyback:186] Out: 0960 [Urine:3375]  Intake/Output this shift:  Total I/O In: 240 [P.O.:240] Out: -  Weight change: -0.272 kg (-9.6 oz) Gen: NAD CVS: no rub Resp: CTA  AVW:[id number] Ext: trace pretibial edema, RUE AVF +T/B  Recent Labs  Lab 05/11/17 1432 05/12/17 0309 05/13/17 0352 05/14/17 0241 05/15/17 0646 05/16/17 0445 05/17/17 0254 05/18/17 0624  NA  --  133* 133* 134* 135 135 134* 135  K  --  3.7 4.0 4.0 3.7 3.8 3.9 3.6  CL  --  92* 91* 91* 91* 93* 92* 92*  CO2  --  27 25 26 30 27 28 30   GLUCOSE  --  137* 119* 109* 129* 117* 110* 109*  BUN  --  107* 114* 120* 118* 116* 116* 110*  CREATININE  --  4.36* 4.72* 4.80* 4.81* 4.76* 4.85* 4.72*  ALBUMIN 3.2*  --   --   --  3.4*  --  3.3* 3.0*  CALCIUM  --  9.5 9.9 9.7 10.2 9.7 9.5 9.6  PHOS  --   --   --   --  4.8*  --  4.8* 4.5   Liver Function Tests: Recent Labs  Lab 05/15/17 0646 05/17/17 0254 05/18/17 0624  ALBUMIN 3.4* 3.3* 3.0*   No results for input(s): LIPASE, AMYLASE in the last 168 hours. No results for input(s): AMMONIA in the last 168 hours. CBC: Recent Labs  Lab 05/13/17 0352 05/14/17 0241 05/15/17 0646 05/16/17 0445 05/17/17 0254  WBC 7.2 6.8 6.6 8.8 7.8  NEUTROABS 4.7 4.5 4.2 6.5 5.3  HGB 11.0* 10.2* 10.8* 9.6* 9.8*  HCT 32.5* 30.2* 31.9* 29.1* 29.4*  MCV 94.8 94.7 95.5 96.4 95.8  PLT 540* 478* 566* 473* 444*   Cardiac Enzymes: Recent Labs  Lab 05/15/17 1753 05/15/17 2331 05/16/17 0445  TROPONINI 0.05* 0.05*  0.07*   CBG: No results for input(s): GLUCAP in the last 168 hours.  Iron Studies: No results for input(s): IRON, TIBC, TRANSFERRIN, FERRITIN in the last 72 hours. Studies/Results: No results found. [place] Kitchen allopurinol  100 mg Oral Daily  . amiodarone  200 mg Oral Daily  . amLODipine  5 mg Oral Daily  . aspirin  81 mg Oral Daily  . brimonidine  1 drop Both Eyes QODAY  . chlorhexidine  15 mL Mouth Rinse BID  . clopidogrel  75 mg Oral Daily  . doxazosin  4 mg Oral Daily  . enoxaparin (LOVENOX) injection  30 mg Subcutaneous Q24H  . ezetimibe  10 mg Oral Daily  . FLUoxetine  20 mg Oral Daily  . hydrALAZINE  75 mg Oral BID  . isosorbide mononitrate  120 mg Oral Daily  . mouth rinse  15 mL Mouth Rinse q12n4p  . Melatonin  3 mg Oral QHS  . metoprolol tartrate  25 mg Oral BID  . polyethylene glycol  17 g Oral BID  . ranolazine  500 mg Oral BID  . senna-docusate  1 tablet Oral BID  . tamsulosin  0.4 mg Oral QPC breakfast  . torsemide  50 mg Oral Daily    BMET    Component Value Date/Time   NA 135 05/18/2017 0624   K 3.6 05/18/2017 0624   CL 92 (L) 05/18/2017 0624   CO2 30 05/18/2017 0624   GLUCOSE 109 (H) 05/18/2017 0624   BUN 110 (H) 05/18/2017 0624   CREATININE 4.72 (H) 05/18/2017 0624   CALCIUM 9.6 05/18/2017 0624   GFRNONAA 11 (L) 05/18/2017 0624   GFRAA 13 (L) 05/18/2017 0624   CBC    Component Value Date/Time   WBC 7.8 05/17/2017 0254   RBC 3.07 (L) 05/17/2017 0254   HGB 9.8 (L) 05/17/2017 0254   HCT 29.4 (L) 05/17/2017 0254   PLT 444 (H) 05/17/2017 0254   MCV 95.8 05/17/2017 0254   MCH 31.9 05/17/2017 0254   MCHC 33.3 05/17/2017 0254   RDW 14.1 05/17/2017 0254   LYMPHSABS 1.4 05/17/2017 0254   MONOABS 0.7 05/17/2017 0254   EOSABS 0.4 05/17/2017 0254   BASOSABS 0.0 05/17/2017 0254     Assessment/Plan:  1. AKI/CKD stage 4- in setting of decompensated CHF and diuresis and obstructive uropathy.  Baseline Scr ~2.8 and has seen a Nephrologist in [place].  Will need  to establish care here while he is staying with his daughter as they may be moving here.  Scr has remained elevated and peaked at 4.85 but improved to 4.72.  He is without uremic symptoms and wants to go home.  We will arrange for ov next week and recheck labs at that time. 2. Obstructive uropathy- s/p Foley catheter placement with marked improvement of UOP but not sig drop in Cr.  Continue with foley catheter per Urology.  On flomax 3. Decompensated CHF- improved EF 50-55% and grade 2 Diastolic dysfunction. 4. CAD s/p CABG with bump in troponin.  Medical management per Cardiology. 5. Anemia of CKD- will need outpatient management 6. HTN- stable 7. Vascular access- s/p RUE AVF placed 05/15/17 by Dr. Donnetta Hutching. 8. Disposition- for discharge today and f/u with Dr. Hoyt Koch next week.  Donetta Potts, MD [employer] [phone]

## 2017-05-18 NOTE — Progress Notes (Signed)
Pt ambulated in the hall way with a walker about 500 ft. During the walk, pt was on room air. His O2 sats would stay between 93 - 95%, but at times he would have to stop and catch his breath when the O2 would go down to about 87%.  Will continue to monitor.  Lupita Dawn, RN

## 2017-05-18 NOTE — Discharge Summary (Signed)
Physician Discharge Summary  Anuj Summons DXI:[id number] DOB: 11-17-1945 DOA: 04/30/2017  PCP: Patient, No Pcp Per  Admit date: 04/30/2017 Discharge date: 05/18/2017  Time spent: 35 minutes  Recommendations for Outpatient Follow-up:  1. New medications this admission include amiodarone 200 daily which is to be continued versus discontinued as per cardiology close follow-up in the outpatient setting, Flomax 0.4 mg 2. Giving limited dose of Xanax 0.5 3 times daily as needed, MiraLAX, note dosage change of Imdur from 60-120 and addition of Ranexa 500 twice daily 3. Will need outpatient urology follow-up for indwelling Foley which patient will be discharged with 4. [facility] will follow patient closely and evaluate for eventual dialysis versus not 5. Will need transitional care appointment with cardiology and we will try and arrange the same 6. Suggest outpatient male mammogram if he continues to have recurrent pain in the left breast-- to my exam no gross findings  Discharge Diagnoses:  Principal Problem:   Anxiety Active Problems:   Unstable angina (HCC)   Anemia due to chronic kidney disease   Essential hypertension   OSA on CPAP   NSTEMI (non-ST elevated myocardial infarction) ([place])   Demand ischemia (HCC)   Influenza A   Acute respiratory failure with hypoxia (HCC)   Pulmonary edema   Paroxysmal atrial fibrillation (HCC)   Acute on chronic diastolic heart failure (HCC)   Acute renal failure superimposed on stage 4 chronic kidney disease (HCC)   Chest pain   Goals of care, counseling/discussion   Palliative care by specialist   Discharge Condition: Improved but at high risk for readmission  Diet recommendation: Heart healthy low-salt  Filed Weights   05/16/17 0644 05/17/17 0527 05/18/17 0653  Weight: 113.3 kg (249 lb 12.8 oz) 113.7 kg (250 lb 9.6 oz) 113.4 kg (250 lb)    History of present illness:  72 year old male originally from [place] with  Dr. Berneice Gandy [phone] Cardiology, [facility] CAD status post CABG x2+ stent, carotid stenosis status post left carotid endarterectomy 8/18, HTN, HLD, CKD 4 not on dialysis, OSA on CPAP, morbid obesity-initially admitted 05/01/2017 with left-sided chest pain after dinner and radiation to the jaw with temporary relief from nitroglycerin Recently treated for persistent cough with azithromycin at an urgent care Admitted secondary to chest pain on admission blood pressures 140s-170s heart rate within normal limits no white count creatinine 3.4-of no EKG found to have significant ST depression signs of ischemia and cardiology started on heparin drip at that time    Hospital Course:   Unstable angina-admitted to medical floor-last cath 04/2014 Kept on aspirin Plavix placed on nitroglycerin infusion cardiology evaluation felt--high risk of nephrotoxicity with cardiac cath-troponin peak was 4.4- medical management--placed on amlodipine ranolazine, metoprolol, long-acting nitrate On asa 81 daily  recurrent CP today 3/29-relieved by nitro, no EKG changes troponin negative -Discontinued completely Nitropaste 3/31 expect no further recurrence but will monitor overnight today Cardiology followed in the hospital and sign off and I will schedule a transitional appointment for outpatient management as he appears to be staying in town not returning to [place] anytime soon  Sepsis secondary to flu, possible pneumonia ?-monitor--last temp 102.2 3/20 Blood culture no growth to date and sputum culture gram-positive cocci few gram-negative rods Now has had 12 days of cefepime doxycycline-stopped Abx 3.27 D/c CT chest-complete Rx Bact PNA with no new sympt so did not feel needed at that time has been stable since  Sinus brady probably form PO Imdur and Nitro-paste--see above--no further bradycardia  obst uropathy causing abd pain- could have been distended bladder?--see nursing noted 3/28 has foley  indwelling for the time being Clamp catheter but no sensation so will need outpatient urology visit and have left in [facility] in 2 weeks Started Flomax 0.4 mg 3/31  New onset paroxysmal atrial flutter fib-cardiology managing placed on amiodarone with some sinus bradycardia so meds adjusted-metoprolol dropped to 25 twice daily-anticoagulation ?recurrence of Afib afib--Defer to cardiology in stable On monitors no recurrent A. fib and just sinus bradycardia  keep dose of amiodarone at 200 and plan for transitional visit as an outpatient   Acute/chronic diastolic heart failure-nephrology consulted given the rising creatinine from admission 63/2.9-->114/4.7, patient is net -5.5 L and weight is down from 123.5-116--->113 Repeat desaturation screen in a.m. Nephrology feels Demadex 50 mg daily appropriate and if gains more than 2 pounds in 24 hours time take an extra 50 mg dose Outpatient follow-up will be scheduled by nephrology  AK I superimposed on CKD stage IV-creatinine rising-see above discussion --vein mapping performed 3/27 for fistula creation dependent on wishes for dialysis versus not -No temporary access at this time -fisutla placed 3/29 RUE  Left breast pain Will need outpatient management and follow-up if this is recurrent  Hypertension-some hypotension -current meds of amiodarone 200 daily, amlodipine 10 changed on 3/30 to 5 mg, hydralazine 75 twice daily, Imdur 120 daily, metoprolol 25 twice daily  Bipolar continue Xanax 0.5 3 times daily as needed, fluoxetine 20 daily  BPH continue Cardura 4 mg daily, Flomax added this admission and has a Foley catheter in place as well which will need to be continued until patient can schedule an outpatient follow-up  OSA on CPAP at night--would resume at night  Anemia of chronic disease  Hyperlipidemia continue statin--discussion per cardiology--change to fenofibrate on discharge    Consultants:    Cardiology  Nephrology  Renal  Procedures:   Vein mapping 3/26  ECho 3/17-Study Conclusions  - Left ventricle: The cavity size was normal. Wall thickness was increased in a pattern of mild LVH. Systolic function was normal. The estimated ejection fraction was in the range of 50% to 55%. Mild hypokinesis of the anteroseptal myocardium. Features are consistent with a pseudonormal left ventricular filling pattern, with concomitant abnormal relaxation and increased filling pressure (grade 2 diastolic dysfunction). Doppler parameters are consistent with high ventricular filling pressure. - Aortic valve: Transvalvular velocity was within the normal range. There was no stenosis. There was no regurgitation. - Mitral valve: Transvalvular velocity was within the normal range. There was no evidence for stenosis. There was trivial regurgitation. - Left atrium: The atrium was severely dilated. - Right ventricle: The cavity size was normal. Wall thickness was normal. Systolic function was normal. - Atrial septum: No defect or patent foramen ovale was identified by color flow Doppler. - Tricuspid valve: There was mild regurgitation. - Pulmonary arteries: Systolic pressure was moderately to severely  increased. PA peak pressure: 59 mm Hg (S  Antimicrobials:   Stopped doxy and cefepime 3/27    Discharge Exam: Vitals:   05/18/17 0653 05/18/17 0809  BP:  (!) 117/49  Pulse:  (!) 55  Resp: 17 17  Temp:  98.1 F (36.7 C)  SpO2:  97%    General: Alert little short of breath with movement but seems to be better without chest pain some heaviness because he ate a big breakfast but no angina Cardiovascular: S1-S2 no murmur rub or gallop Respiratory: Clinically clear no added sound Abdomen soft  no rebound swelling much decreased in lower extremity and abdomen  Discharge Instructions    Allergies as of 05/18/2017      Reactions   Statins Other (See  Comments)   Full body muscle stuffiness/pain      Medication List    STOP taking these medications   azithromycin 250 MG tablet Commonly known as:  ZITHROMAX   brimonidine 0.2 % ophthalmic solution Commonly known as:  ALPHAGAN   diphenhydrAMINE 25 MG tablet Commonly known as:  BENADRYL   furosemide 20 MG tablet Commonly known as:  LASIX     TAKE these medications   allopurinol 100 MG tablet Commonly known as:  ZYLOPRIM Take 100 mg by mouth daily.   ALPRAZolam 0.5 MG tablet Commonly known as:  XANAX Take 1 tablet (0.5 mg total) by mouth 3 (three) times daily as needed for anxiety.   amiodarone 200 MG tablet Commonly known as:  PACERONE Take 1 tablet (200 mg total) by mouth daily. Start taking on:  05/19/2017   amLODipine 5 MG tablet Commonly known as:  NORVASC Take 5 mg by mouth daily.   aspirin EC 81 MG tablet Take 81 mg by mouth at bedtime.   clopidogrel 75 MG tablet Commonly known as:  PLAVIX Take 75 mg by mouth daily.   COSENTYX 150 MG/ML Sosy Generic drug:  Secukinumab Inject 300 mg into the skin every 30 (thirty) days.   doxazosin 4 MG tablet Commonly known as:  CARDURA Take 4 mg by mouth daily.   fenofibrate 145 MG tablet Commonly known as:  TRICOR Take 145 mg by mouth daily.   FLUoxetine 20 MG tablet Commonly known as:  PROZAC Take 20 mg by mouth daily.   hydrALAZINE 25 MG tablet Commonly known as:  APRESOLINE Take 3 tablets (75 mg total) by mouth 2 (two) times daily. What changed:    medication strength  how much to take   isosorbide mononitrate 120 MG 24 hr tablet Commonly known as:  IMDUR Take 1 tablet (120 mg total) by mouth daily. Start taking on:  05/19/2017 What changed:  medication strength   Melatonin 5 MG Tabs Take 5 mg by mouth at bedtime.   metoprolol tartrate 50 MG tablet Commonly known as:  LOPRESSOR Take 0.5 tablets (25 mg total) by mouth 2 (two) times daily. What changed:  how much to take   nitroGLYCERIN 0.4 MG SL  tablet Commonly known as:  NITROSTAT Place 0.4 mg under the tongue every 5 (five) minutes as needed for chest pain.   omega-3 acid ethyl esters 1 g capsule Commonly known as:  LOVAZA Take 1 g by mouth daily.   polyethylene glycol packet Commonly known as:  MIRALAX / GLYCOLAX Take 17 g by mouth 2 (two) times daily.   ranolazine 500 MG 12 hr tablet Commonly known as:  RANEXA Take 1 tablet (500 mg total) by mouth 2 (two) times daily.   tamsulosin 0.4 MG Caps capsule Commonly known as:  FLOMAX Take 1 capsule (0.4 mg total) by mouth daily after breakfast. Start taking on:  05/19/2017   torsemide 100 MG tablet Commonly known as:  DEMADEX Take 0.5 tablets (50 mg total) by mouth daily. If you gain more than 3 pounds in 24 hours please take 2 tablets and call MD Start taking on:  05/19/2017            Durable Medical Equipment  (From admission, onward)        Start     Ordered  05/18/17 1336  DME 3-in-1  Once     05/18/17 1335   05/18/17 1336  For home use only DME Walker rolling  [facility])  Once    Question:  Patient needs a walker to treat with the following condition  Answer:  Heart failure ([place])   05/18/17 1335     Allergies  Allergen Reactions  . Statins Other (See Comments)    Full body muscle stuffiness/pain   Follow-up Information    Early, Arvilla Meres, MD In 6 weeks.   Specialties:  Vascular Surgery, Cardiology Why:  Office will call you to arrange your appt (sent) Contact information: [place] [address] [place], [facility]. Schedule an appointment as soon as possible for a visit in 10 day(s).   Contact information: [address]  [place] [address] [place] [address] [phone]            The results of significant diagnostics from this hospitalization (including imaging, microbiology, ancillary and laboratory) are listed below for reference.    Significant Diagnostic Studies: Dg Chest 2 View  Result Date:  05/01/2017 CLINICAL DATA:  Acute onset of left-sided chest pain, radiating to the jaw. EXAM: CHEST - 2 VIEW COMPARISON:  None. FINDINGS: The lungs are well-aerated and clear. There is no evidence of focal opacification, pleural effusion or pneumothorax. The heart is borderline enlarged. The patient is status post median sternotomy. No acute osseous abnormalities are seen. Chronic left-sided rib deformities are noted. IMPRESSION: Borderline cardiomegaly.  Lungs remain grossly clear. Electronically Signed   By: Garald Balding M.D.   On: 05/01/2017 01:04   US Renal  Result Date: 05/11/2017 CLINICAL DATA:  Chronic renal failure. EXAM: RENAL / URINARY TRACT ULTRASOUND COMPLETE COMPARISON:  None. FINDINGS: Right Kidney: Length: 9.3 cm. Two simple cysts are noted, with the largest measuring 10.8 cm. Echogenicity within normal limits. No mass or hydronephrosis visualized. Left Kidney: Length: 11.9 cm. Two simple cysts are noted, with the largest measuring 3.6 cm. Echogenicity within normal limits. No mass or hydronephrosis visualized. Bladder: Appears normal for degree of bladder distention. IMPRESSION: Bilateral simple renal cysts. No other significant renal abnormality seen. Electronically Signed   By: Marijo Conception, M.D.   On: 05/11/2017 15:43   Dg Chest Port 1 View  Result Date: 05/09/2017 CLINICAL DATA:  Shortness of breath. EXAM: PORTABLE CHEST 1 VIEW COMPARISON:  Chest x-ray from yesterday. FINDINGS: Stable cardiomegaly and vascular congestion. Patchy airspace opacities in the right greater than left lungs are similar to prior study. Unchanged small right pleural effusion. No pneumothorax. No acute osseous abnormality. IMPRESSION: Stable vascular congestion and patchy right greater than left airspace opacities which could reflect edema or infection. Electronically Signed   By: Titus Dubin M.D.   On: 05/09/2017 07:54   Dg Chest Port 1 View  Result Date: 05/08/2017 CLINICAL DATA:  Pulmonary edema  EXAM: PORTABLE CHEST 1 VIEW COMPARISON:  05/06/2017 FINDINGS: Cardiac shadow remains enlarged. Postsurgical changes are again seen. Vascular congestion is again noted. The patchy infiltrative changes are again seen and stable. Small right pleural effusion remains. No acute bony abnormality is seen. IMPRESSION: Vascular congestion and patchy infiltrative changes stable from the previous exam. Electronically Signed   By: Inez Catalina M.D.   On: 05/08/2017 08:29   Dg Chest Port 1 View  Result Date: 05/07/2017 CLINICAL DATA:  Initial evaluation for acute respiratory distress, chest pain. EXAM: PORTABLE CHEST 1 VIEW COMPARISON:  Prior radiograph from earlier the same day. FINDINGS: Median sternotomy wires underlying CABG markers again noted. Stable cardiomegaly. Mediastinal silhouette normal. Lungs normally inflated. Slightly worsened diffuse pulmonary vascular congestion with interstitial prominence, consistent with pulmonary edema. New parenchymal infiltrates within the right upper and bilateral lower lobes, which may reflect edema and/or possibly infiltrates. Small right pleural effusion. No pneumothorax. Osseous structures are unchanged. IMPRESSION: 1. Stable cardiomegaly with interval worsening in diffuse vascular and interstitial prominence, consistent with pulmonary edema. 2. New more confluent patchy right upper and bibasilar opacities, which may reflect edema and/or infiltrates. 3. Small right pleural effusion. Electronically Signed   By: Jeannine Boga M.D.   On: 05/07/2017 00:00   Dg Chest Port 1 View  Result Date: 05/06/2017 CLINICAL DATA:  Coronary artery disease, status post coronary artery bypass grafting EXAM: PORTABLE CHEST 1 VIEW COMPARISON:  May 05, 2017 pain May 01, 2017 FINDINGS: There are areas of scarring and fibrotic change bilaterally. Mild interstitial pulmonary edema in the mid lower lung zones is stable. There is a small pleural effusion on each side. There is no new opacity.  There is no airspace consolidation. There is cardiomegaly. The pulmonary vascularity is normal. Patient is status post coronary artery bypass grafting. There is evidence of old rib trauma on the left superiorly. IMPRESSION: Mild interstitial edema, stable. Small pleural effusions bilaterally. Underlying areas of fibrosis and scarring. Stable cardiomegaly. No appreciable change compared to 1 day prior. Electronically Signed   By: Lowella Grip III M.D.   On: 05/06/2017 12:24   Dg Chest Port 1 View  Result Date: 05/05/2017 CLINICAL DATA:  Hypoxia. EXAM: PORTABLE CHEST 1 VIEW COMPARISON:  05/04/2017.  05/01/2017. FINDINGS: Prior CABG. Stable cardiomegaly. Slight improvement bibasilar interstitial prominence suggesting slight clearing of pulmonary interstitial edema. Small left pleural effusion. No pneumothorax. Degenerative changes scoliosis thoracic spine. IMPRESSION: Prior CABG. Stable cardiomegaly. Persistent but slightly improved bibasilar interstitial prominence suggesting slight clearing of pulmonary interstitial edema. Small left pleural effusion. Electronically Signed   By: Marcello Moores  Register   On: 05/05/2017 07:36   Dg Chest Port 1 View  Result Date: 05/04/2017 CLINICAL DATA:  Cough. EXAM: PORTABLE CHEST 1 VIEW COMPARISON:  Single-view of the chest 0 3/15/scratch the single view of the chest and PA and lateral chest 05/01/2017. FINDINGS: There is cardiomegaly. The patient has new airspace disease in the mid and lower lung zones bilaterally. Aortic atherosclerosis is noted. Trace right pleural effusion is unchanged. IMPRESSION: New bilateral airspace disease could be due to edema or pneumonia. Cardiomegaly. Atherosclerosis. Electronically Signed   By: Inge Rise M.D.   On: 05/04/2017 14:16   Dg Chest Port 1 View  Result Date: 05/01/2017 CLINICAL DATA:  Acute onset of left central chest pain. EXAM: PORTABLE CHEST 1 VIEW COMPARISON:  Chest radiograph performed earlier today at 12:23 a.m.  FINDINGS: The lungs are well-aerated. Mild bibasilar atelectasis is noted. There is no evidence of pleural effusion or pneumothorax. The cardiomediastinal silhouette is borderline normal in size. The patient is status post median sternotomy. No acute osseous abnormalities are seen. Chronic left-sided rib deformities are noted. IMPRESSION: Mild bibasilar atelectasis.  Lungs otherwise clear. Electronically Signed   By: Garald Balding M.D.   On: 05/01/2017 05:27    Microbiology: Recent Results (from the past 240 hour(s))  Surgical pcr screen     Status: None   Collection Time: 05/14/17 11:13 PM  Result Value Ref Range Status   MRSA, PCR NEGATIVE NEGATIVE Final   Staphylococcus aureus NEGATIVE NEGATIVE  Final    Comment: (NOTE) The Xpert SA Assay (FDA approved for NASAL specimens in patients 6 years of age and older), is one component of a comprehensive surveillance program. It is not intended to diagnose infection nor to guide or monitor treatment. Performed at [facility], [place] [address] [place], [place] [address]      Labs: Basic Metabolic Panel: Recent Labs  Lab 05/14/17 0241 05/15/17 0646 05/16/17 0445 05/17/17 0254 05/18/17 0624  NA 134* 135 135 134* 135  K 4.0 3.7 3.8 3.9 3.6  CL 91* 91* 93* 92* 92*  CO2 26 30 27 28 30   GLUCOSE 109* 129* 117* 110* 109*  BUN 120* 118* 116* 116* 110*  CREATININE 4.80* 4.81* 4.76* 4.85* 4.72*  CALCIUM 9.7 10.2 9.7 9.5 9.6  PHOS  --  4.8*  --  4.8* 4.5   Liver Function Tests: Recent Labs  Lab 05/11/17 1432 05/15/17 0646 05/17/17 0254 05/18/17 0624  ALBUMIN 3.2* 3.4* 3.3* 3.0*   No results for input(s): LIPASE, AMYLASE in the last 168 hours. No results for input(s): AMMONIA in the last 168 hours. CBC: Recent Labs  Lab 05/13/17 0352 05/14/17 0241 05/15/17 0646 05/16/17 0445 05/17/17 0254  WBC 7.2 6.8 6.6 8.8 7.8  NEUTROABS 4.7 4.5 4.2 6.5 5.3  HGB 11.0* 10.2* 10.8* 9.6* 9.8*  HCT 32.5* 30.2* 31.9* 29.1* 29.4*  MCV  94.8 94.7 95.5 96.4 95.8  PLT 540* 478* 566* 473* 444*   Cardiac Enzymes: Recent Labs  Lab 05/15/17 1753 05/15/17 2331 05/16/17 0445  TROPONINI 0.05* 0.05* 0.07*   BNP: BNP (last 3 results) Recent Labs    05/01/17 0316  BNP 366.5*    ProBNP (last 3 results) No results for input(s): PROBNP in the last 8760 hours.  CBG: No results for input(s): GLUCAP in the last 168 hours.     Signed:  Nita Sells MD   [facility] 05/18/2017, 12:57 PM

## 2017-05-18 NOTE — Care Management Note (Signed)
Case Management Note Nathan Gibbons RN, BSN Unit 4E-Case Manager- [place] coverage [phone]  Patient Details  Name: Nathan [facility] MRN: [id number] Date of Birth: [birth year]  Subjective/Objective:   Pt admitted with USA/STEMI, +Flu A/CAP                 Action/Plan: PTA pt lived at home with spouse, CM to follow for transition of care needs.   Expected Discharge Date:  05/18/17               Expected Discharge Plan:  [place]  In-House Referral:  Hospice / Palliative Care  Discharge planning Services  CM Consult  Post Acute Care Choice:  Durable Medical Equipment, Home Health Choice offered to:  Patient, Adult Children, Spouse  DME Arranged:  3-N-1, Walker rolling DME Agency:  [place] Arranged:  RN, PT [facility] Agency:  [facility]  Status of Service:  Completed, signed off  If discussed at [place] of Stay Meetings, dates discussed:    Discharge Disposition: home/ home health   Additional Comments:  05/18/17- 1400- Nathan Gibbons RN, CM- pt for d/c home today with daughter Nathan [facility]- local address at [facility] is- [place], [place] [address]- [phone],  CM f/u with pt and daughter for [facility] choice- Nathan [facility] is choice for [facility] services- call made to [facility] with Nathan [facility] for HHRN/PT- Nathan [facility] will also be able to use their doctor for PCP needs and to sign orders for [facility] while he is here with daughter- as pt does not have primary care here his PCP is in [place]. Pt and daughter are both agreeable to this arrangement. Orders have been placed for [facility] and DME needs- pt will not need home 02 per MD. Notified Nathan [facility] for DME needs- RW and 3n1 to be delivered to room prior to discharge.   05/17/17- 1400- Nathan Raj RN, CM- pt progressing well- tx to 4E- has had AV fistula placed for future HD. F/U done at the bedside with pt/wife/daughter for transition of care needs- per conversation plan is for pt to stay here with  daughter to recover prior to returning to [facility]. Discussed HH needs and DME- pt would need a 3n1 and RW and would benefit from HHRN/PT/OT- will ask MD for orders prior to discharge- have provided pt and family a list of [place] agencies for [facility] to review- and CM will f/u for choice to make referrals to prior to discharge. - Pt is on RW however family asking about home 02- explained to pt and family that pt would have to qualify under Medicare guidelines in order for insurance to cover home 02. - CM will continue to follow for transition needs.   05/08/17- 1400- Nathan Molina RN, CM-  Pt tx back to ICU for further tx- is here visiting daughter from [place] he and his wife both got sick with flu- per daughter wife is to d/c home today from [place]- pt has more complicated stay with heart and renal issues. Pt and wife live in [place], have 2 daughters here in [place], one here local and one in [place]. Per conversation with daughter whom pt was staying with here- pt and wife would prefer to get back to [facility]- however understand pt may need more time to recover prior to traveling back safely to [place]. They are discussing the options of home with daughter and HH vs possible STSNF here- also want to have discussion  with PC (consult is pending) to talk about PC vs hospice. Daughter is very realalistic and wants to do what her parents want but also what is best for her dad. Explained to daughter that CM would follow for transition needs as pt improved - provided daughter with CM contact info. Pt will need PT/OT evals when medically ready to assist with recommendations for discharge.   Nathan Patricia, RN 05/18/2017, 1:59 PM

## 2017-05-18 NOTE — Progress Notes (Signed)
Noted that MD has re-ordered PT and performed screen of patient today (PT had been seeing patient but had signed off due to meeting all goals). Per chart review, patient has been walking 560ft with family and nursing, and both he and his family report no new concerns that would warrant re-starting PT at this time. They are hopeful for possible DC home today.   At this time patient does not appear to be in need of skilled PT services in the acute setting, but continue to recommend skilled HHPT services moving forward. PT signing off for now, thank you for the referral.   Deniece Ree PT, DPT, CBIS  Supplemental Physical Therapist [facility]   Pager [phone]

## 2017-05-18 NOTE — Progress Notes (Signed)
Occupational Therapy Treatment Patient Details Name: Nathan Wolfe MRN: [id number] DOB: 09-20-1945 Today's Date: 05/18/2017    History of present illness Pt is a 72 y.o. male admitted 04/30/08 with chest pain not relieved by NG; worked up for STEMI with elevated troponins likely secondary to demand ischemia from influenza pneumonia and HF exacerbation with underlying worsening CKD. Worsening kidney function and may require HD. Tentatively scheduled him for RUE AV fistula on 3/29. 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    OT comments  Pt making progress towards OT goals. Focus of session on energy conservation, DME and AE for completing ADLs. Education provided throughout and pt verbalizing and return demonstrating use of AE to complete LB ADLs, demonstrating with min verbal cues. Pt's daughter also present and actively engaged in therapy session. Questions answered throughout. Pt anticipating d/c home later today.    Follow Up Recommendations  Home health OT;Supervision/Assistance - 24 hour    Equipment Recommendations  3 in 1 bedside commode;Other (comment)(rollator )          Precautions / Restrictions Precautions Precautions: Fall Precaution Comments: watch O2 Sats Restrictions Weight Bearing Restrictions: No       Mobility Bed Mobility               General bed mobility comments: DNT, received up in chair   Transfers Overall transfer level: Modified independent Equipment used: Rolling walker (2 wheeled) Transfers: Sit to/from Stand Sit to Stand: Modified independent (Device/Increase time)         General transfer comment: vc for hand placement    Balance Overall balance assessment: Needs assistance Sitting-balance support: Feet supported;No upper extremity supported Sitting balance-[name]: Good     Standing balance support: During  functional activity;Single extremity supported Standing balance-[name]: Fair Standing balance comment: Can static stand with no UE support; dynamic stability improved with BUE support                           ADL either performed or assessed with clinical judgement   ADL Overall ADL's : Needs assistance/impaired                                       General ADL Comments: Pt reporting increased difficulty with LB ADLs, especially donning socks. Educated pt/pt's daughter on AE for completing LB ADLs including sock aide, LH sponge, and reacher. Pt verbalizing and return demonstrating understanding; educated on uses of 3:1 and energy conservation during functional task completion after return home with pt verbalizing understanding                       Cognition Arousal/Alertness: Awake/alert Behavior During Therapy: WFL for tasks assessed/performed Overall Cognitive Status: Within Functional Limits for tasks assessed                                 General Comments: H/o depression/anxiety per daughter. WFL this session                    General Comments Pt's daughter present and engaged throughout session    Pertinent Vitals/ Pain       Pain Assessment:  No/denies pain  Home Living Family/patient expects to be discharged to:: Private residence Living Arrangements: Spouse/significant other Available Help at Discharge: Family;Available 24 hours/day Type of Home: House(townhome ) Home Access: Level entry     Home Layout: Two level;Able to live on main level with bedroom/bathroom Alternate Level Stairs-Number of Steps: 17 steps    Bathroom Shower/Tub: Occupational psychologist: Standard Bathroom Accessibility: Yes   Home Equipment: Shower seat - built in   Additional Comments: Plan is to DC to daughter's house until he is able to travel home. level entry; 1 level; walk in ; bathroom RW accessible      Prior  Functioning/Environment Level of Independence: Independent        Comments: sport pilot; drives motorcycles   Frequency  Min 2X/week        Progress Toward Goals  OT Goals(current goals can now be found in the care plan section)  Progress towards OT goals: Progressing toward goals  Acute Rehab OT Goals Patient Stated Goal: Home to daughter's house at d/c. Hopeful to get back to [facility] and flying his plane again OT Goal Formulation: With patient Time For Goal Achievement: 05/26/17 Potential to Achieve Goals: Good  Plan Discharge plan remains appropriate                     AM-PAC PT "6 Clicks" Daily Activity     Outcome Measure   Help from another person eating meals?: None Help from another person taking care of personal grooming?: A Little Help from another person toileting, which includes using toliet, bedpan, or urinal?: A Little Help from another person bathing (including washing, rinsing, drying)?: A Little Help from another person to put on and taking off regular upper body clothing?: A Little Help from another person to put on and taking off regular lower body clothing?: A Little 6 Click Score: 19    End of Session Equipment Utilized During Treatment: Other (comment)(adaptive equipment )  OT Visit Diagnosis: Unsteadiness on feet (R26.81);Muscle weakness (generalized) (M62.81);Other symptoms and signs involving cognitive function   Activity Tolerance Patient tolerated treatment well   Patient Left in chair;with call bell/phone within reach;with family/visitor present   Nurse Communication Mobility status        Time: 1440-1501 OT Time Calculation (min): 21 min  Charges: OT General Charges $OT Visit: 1 Visit OT Treatments $Self Care/Home Management : 8-22 mins  Nathan Wolfe, OT Pager [phone] 05/18/2017    Nathan Wolfe 05/18/2017, 3:56 PM

## 2017-05-20 ENCOUNTER — Ambulatory Visit (INDEPENDENT_AMBULATORY_CARE_PROVIDER_SITE_OTHER): Payer: Medicare Other | Admitting: Physician Assistant

## 2017-05-20 ENCOUNTER — Ambulatory Visit: Payer: Medicare Other | Admitting: Cardiovascular Disease

## 2017-05-20 ENCOUNTER — Encounter: Payer: Self-pay | Admitting: Physician Assistant

## 2017-05-20 ENCOUNTER — Telehealth: Payer: Self-pay | Admitting: Vascular Surgery

## 2017-05-20 VITALS — BP 104/74 | HR 47

## 2017-05-20 DIAGNOSIS — Z9989 Dependence on other enabling machines and devices: Secondary | ICD-10-CM | POA: Diagnosis not present

## 2017-05-20 DIAGNOSIS — G4733 Obstructive sleep apnea (adult) (pediatric): Secondary | ICD-10-CM | POA: Diagnosis not present

## 2017-05-20 DIAGNOSIS — I25708 Atherosclerosis of coronary artery bypass graft(s), unspecified, with other forms of angina pectoris: Secondary | ICD-10-CM

## 2017-05-20 DIAGNOSIS — I739 Peripheral vascular disease, unspecified: Secondary | ICD-10-CM

## 2017-05-20 DIAGNOSIS — I214 Non-ST elevation (NSTEMI) myocardial infarction: Secondary | ICD-10-CM | POA: Diagnosis not present

## 2017-05-20 DIAGNOSIS — I200 Unstable angina: Secondary | ICD-10-CM

## 2017-05-20 DIAGNOSIS — I779 Disorder of arteries and arterioles, unspecified: Secondary | ICD-10-CM

## 2017-05-20 DIAGNOSIS — I2510 Atherosclerotic heart disease of native coronary artery without angina pectoris: Secondary | ICD-10-CM | POA: Diagnosis not present

## 2017-05-20 DIAGNOSIS — I480 Paroxysmal atrial fibrillation: Secondary | ICD-10-CM

## 2017-05-20 DIAGNOSIS — N184 Chronic kidney disease, stage 4 (severe): Secondary | ICD-10-CM

## 2017-05-20 MED ORDER — RANOLAZINE ER 500 MG PO TB12: 500.0000 mg | ORAL_TABLET | Freq: Two times a day (BID) | ORAL | 0 refills | Status: DC

## 2017-05-20 MED ORDER — TORSEMIDE 100 MG PO TABS: 50.0000 mg | ORAL_TABLET | Freq: Every day | ORAL | 6 refills | Status: AC

## 2017-05-20 MED ORDER — ISOSORBIDE MONONITRATE ER 120 MG PO TB24: 120.0000 mg | ORAL_TABLET | Freq: Two times a day (BID) | ORAL | 3 refills | Status: AC

## 2017-05-20 MED ORDER — RANOLAZINE ER 500 MG PO TB12: 500.0000 mg | ORAL_TABLET | Freq: Two times a day (BID) | ORAL | 3 refills | Status: AC

## 2017-05-20 NOTE — Telephone Encounter (Signed)
Spoke to spouse, they may be in town still by 5/21 is aware of appt and will call to cancel if need be  Mailing letter to daughter address at   [address] [place]  [address]

## 2017-05-20 NOTE — Patient Instructions (Signed)
Medication Instructions:   STOP HYDRALAZINE  INCREASE ISOSORBIDE TO 120 MG TWICE DAILY  Labwork:  Your physician recommends that you return for lab work WITH KIDNEY DOCTOR=TSH  Follow-Up:  SCHEDULE NEW PATIENT EVALUATION IN THE PHARMACY LIPID CLINIC WITHIN 2 WEEKS  Your physician recommends that you schedule a follow-up appointment in: [place]

## 2017-05-20 NOTE — Telephone Encounter (Signed)
-----   Message from Mena Goes, RN sent at 05/16/2017  2:39 PM EDT ----- Regarding: 4-6 weeks with duplex   ----- Message ----- From: Waynetta Sandy, MD Sent: 05/16/2017  11:07 AM To: [facility] [address]  [place] [id number] 02-Aug-1945  F/u with Dr. Donnetta Hutching in 4-6 weeks with right arm dialysis duplex

## 2017-05-20 NOTE — Progress Notes (Signed)
Cardiology Office Note    Date:  05/22/2017   ID:  Nathan Wolfe, DOB 01-15-1946, MRN [id number]  PCP:  Patient, No Pcp Per  Cardiologist: Dr. Darcey Nora of [facility]  [phone])   Chief Complaint  Patient presents with  . Hospitalization Follow-up    seen for Dr. Gwenlyn Found, post NSTEMI    History of Present Illness:  Nathan Wolfe is a 72 y.o. male with PMH of CAD s/p 3v CABG 2000 and redo CABG 2011, CKD stage IV, carotid artery disease, morbid obesity and OSA on CPAP.  Last cardiac catheterization in March 2016 showed normal EF, patent LIMA to LAD with occluded LAD beyond the LIMA insertion, patent vein graft to RCA, stenting of the PDA with 2.5 mm Synergy DES, a diseased ramus branch was not intervened on.  He is here visiting his daughter from [place] when he presented to the hospital on 05/01/2017 with chest pain, EKG showed 1-2 mm horizontal ST depression in V4 through V6.  Lab work was noted for a creatinine of 3.4.  Although initially the plan was to proceed with cardiac catheterization, however was worsening renal function, medical therapy was eventually instituted.  He had brief atrial fibrillation in the hospital and was loaded on amiodarone.  He was not placed on systemic anticoagulation due to risk of triple therapy outweighing the benefit.  Echocardiogram obtained on 05/03/2017 showed EF 50-55%, grade 2 DD, PA peak pressure 59 mmHg.  He was followed closely by the nephrology service in the hospital as well.  He was seen by vascular surgery on 05/11/2017 and proceeded with AV fistula evaluation.  Patient eventually underwent a right upper arm radiocephalic AV fistula by Dr. Donnetta Wolfe on 05/15/2017  Patient presents today accompanied by her daughter.  He still occasionally has chest pain.  I plan to stop his hydralazine and increase Imdur to 120 mg twice daily.  He need to be followed very closely with nephrology service.  His renal function deteriorated quite significantly  during the last hospitalization.  I will defer to nephrology service for titration of diuretic in this case.  He sleeps with CPAP machine at night.  His daughter wish for him to stay longer including spinal area, however patient says he plans to go back to [place] in the next 2-3 weeks.  He will need a close outpatient visit once he gets back to [place].    Past Medical History:  Diagnosis Date  . CAD (coronary artery disease)   . Carotid artery stenosis   . Chronic diastolic heart failure ([place])    noted on echo 05/03/2017 (EF 50-55%)  . Chronic kidney disease (CKD), stage IV (severe) ([place])   . Morbid obesity ([place])   . OSA on CPAP     Past Surgical History:  Procedure Laterality Date  . AV FISTULA PLACEMENT Right 05/15/2017   Procedure: CREATION OF ARTERIOVENOUS (AV) FISTULA, RIGHT ARM;  Surgeon: [name], MD;  Location: [facility];  Service: Vascular;  Laterality: Right;  . CAROTID ENDARTERECTOMY Left 09/2016  . CORONARY ARTERY BYPASS GRAFT      Current Medications: Outpatient Medications Prior to Visit  Medication Sig Dispense Refill  . allopurinol (ZYLOPRIM) 100 MG tablet Take 100 mg by mouth daily.    Marland Kitchen ALPRAZolam (XANAX) 0.5 MG tablet Take 1 tablet (0.5 mg total) by mouth 3 (three) times daily as needed for anxiety. 30 tablet 0  . amiodarone (PACERONE) 200 MG tablet Take 1 tablet (200 mg total) by mouth daily.  30 tablet 0  . amLODipine (NORVASC) 5 MG tablet Take 5 mg by mouth daily.    Marland Kitchen aspirin EC 81 MG tablet Take 81 mg by mouth at bedtime.    . clopidogrel (PLAVIX) 75 MG tablet Take 75 mg by mouth daily.    Marland Kitchen doxazosin (CARDURA) 4 MG tablet Take 4 mg by mouth daily.    . fenofibrate (TRICOR) 145 MG tablet Take 145 mg by mouth daily.    Marland Kitchen FLUoxetine (PROZAC) 20 MG tablet Take 20 mg by mouth daily.    . Melatonin 5 MG TABS Take 5 mg by mouth at bedtime.    . metoprolol tartrate (LOPRESSOR) 50 MG tablet Take 0.5 tablets (25 mg total) by mouth 2 (two) times daily. 30 tablet 0    . nitroGLYCERIN (NITROSTAT) 0.4 MG SL tablet Place 0.4 mg under the tongue every 5 (five) minutes as needed for chest pain.    Marland Kitchen omega-3 acid ethyl esters (LOVAZA) 1 g capsule Take 1 g by mouth daily.    . polyethylene glycol (MIRALAX / GLYCOLAX) packet Take 17 g by mouth 2 (two) times daily. 14 each 0  . Secukinumab (COSENTYX) 150 MG/ML SOSY Inject 300 mg into the skin every 30 (thirty) days.    . tamsulosin (FLOMAX) 0.4 MG CAPS capsule Take 1 capsule (0.4 mg total) by mouth daily after breakfast. 30 capsule 0  . hydrALAZINE (APRESOLINE) 25 MG tablet Take 3 tablets (75 mg total) by mouth 2 (two) times daily. 180 tablet 0  . isosorbide mononitrate (IMDUR) 120 MG 24 hr tablet Take 1 tablet (120 mg total) by mouth daily. 30 tablet 0  . ranolazine (RANEXA) 500 MG 12 hr tablet Take 1 tablet (500 mg total) by mouth 2 (two) times daily. 60 tablet 0  . torsemide (DEMADEX) 100 MG tablet Take 0.5 tablets (50 mg total) by mouth daily. If you gain more than 3 pounds in 24 hours please take 2 tablets and call MD 60 tablet 0   No facility-administered medications prior to visit.      Allergies:   Statins   Social History   Socioeconomic History  . Marital status: Married    Spouse name: Not on file  . Number of children: Not on file  . Years of education: Not on file  . Highest education level: Not on file  Occupational History  . Not on file  Social Needs  . Financial resource strain: Not on file  . Food insecurity:    Worry: Not on file    Inability: Not on file  . Transportation needs:    Medical: Not on file    Non-medical: Not on file  Tobacco Use  . Smoking status: Unknown If Ever Smoked  . Smokeless tobacco: Never Used  Substance and Sexual Activity  . Alcohol use: Not on file  . Drug use: Not on file  . Sexual activity: Not on file  Lifestyle  . Physical activity:    Days per week: Not on file    Minutes per session: Not on file  . Stress: Not on file  Relationships  .  Social connections:    Talks on phone: Not on file    Gets together: Not on file    Attends religious service: Not on file    Active member of club or organization: Not on file    Attends meetings of clubs or organizations: Not on file    Relationship status: Not on file  Other Topics  Concern  . Not on file  Social History Narrative  . Not on file     Family History:  The patient's family history is not on file.   ROS:   Please see the history of present illness.    ROS All other systems reviewed and are negative.   PHYSICAL EXAM:   VS:  BP 104/74 (BP Location: Left Arm, Patient Position: Sitting, Cuff Size: Large)   Pulse (!) 47   SpO2 93%    GEN: Well nourished, well developed, in no acute distress  HEENT: normal  Neck: no JVD, carotid bruits, or masses Cardiac: RRR; no murmurs, rubs, or gallops,no edema  Respiratory:  clear to auscultation bilaterally, normal work of breathing GI: soft, nontender, nondistended, + BS MS: no deformity or atrophy  Skin: warm and dry, no rash Neuro:  Alert and Oriented x 3, Strength and sensation are intact Psych: euthymic mood, full affect  Wt Readings from Last 3 Encounters:  05/18/17 250 lb (113.4 kg)      Studies/Labs Reviewed:   EKG:  EKG is not ordered today.    Recent Labs: 05/01/2017: B Natriuretic Peptide 366.5 05/05/2017: ALT 33; Magnesium 2.4 05/17/2017: Hemoglobin 9.8; Platelets 444 05/18/2017: BUN 110; Creatinine, Ser 4.72; Potassium 3.6; Sodium 135   Lipid Panel    Component Value Date/Time   CHOL 232 (H) 05/01/2017 0803   TRIG 147 05/01/2017 0803   HDL 33 (L) 05/01/2017 0803   CHOLHDL 7.0 05/01/2017 0803   VLDL 29 05/01/2017 0803   LDLCALC 170 (H) 05/01/2017 0803    Additional studies/ records that were reviewed today include:   Echo 05/03/2017 LV EF: 50% -   55% Study Conclusions  - Left ventricle: The cavity size was normal. Wall thickness was   increased in a pattern of mild LVH. Systolic function was  normal.   The estimated ejection fraction was in the range of 50% to 55%.   Mild hypokinesis of the anteroseptal myocardium. Features are   consistent with a pseudonormal left ventricular filling pattern,   with concomitant abnormal relaxation and increased filling   pressure (grade 2 diastolic dysfunction). Doppler parameters are   consistent with high ventricular filling pressure. - Aortic valve: Transvalvular velocity was within the normal range.   There was no stenosis. There was no regurgitation. - Mitral valve: Transvalvular velocity was within the normal range.   There was no evidence for stenosis. There was trivial   regurgitation. - Left atrium: The atrium was severely dilated. - Right ventricle: The cavity size was normal. Wall thickness was   normal. Systolic function was normal. - Atrial septum: No defect or patent foramen ovale was identified   by color flow Doppler. - Tricuspid valve: There was mild regurgitation. - Pulmonary arteries: Systolic pressure was moderately to severely   increased. PA peak pressure: 59 mm Hg (S).   ASSESSMENT:    1. Coronary artery disease of bypass graft of native heart with stable angina pectoris (HCC)   2. Paroxysmal atrial fibrillation ([place])   3. CKD (chronic kidney disease), stage IV (HCC)   4. Carotid artery disease, unspecified laterality, unspecified type ([place])   5. OSA on CPAP   6. Morbid obesity ([place])      PLAN:  In order of problems listed above:  1. CAD s/p CABG: Continue to have stable angina, increase Imdur to 120 mg twice daily.  Stop hydralazine.  Recently admitted with NSTEMI, however decided to pursue medical therapy given worsening renal function  and high risk of contrast nephropathy.  2. Paroxysmal atrial fibrillation: Patient was not placed on systemic anticoagulation due to risk of therapy. CHA2DS2-Vasc score 2 (CAD, age)   59. CKD stage IV: Renal function worsened significantly during the recent hospitalization.   He eventually underwent AV fistula placement.  4. Carotid artery disease: Will defer to his primary cardiologist for outpatient ultrasound  5. Obstructive sleep apnea on CPAP: Compliant with CPAP machine.    Medication Adjustments/Labs and Tests Ordered: Current medicines are reviewed at length with the patient today.  Concerns regarding medicines are outlined above.  Medication changes, Labs and Tests ordered today are listed in the Patient Instructions below. Patient Instructions  Medication Instructions:   STOP HYDRALAZINE  INCREASE ISOSORBIDE TO 120 MG TWICE DAILY  Labwork:  Your physician recommends that you return for lab work WITH KIDNEY DOCTOR=TSH  Follow-Up:  SCHEDULE NEW PATIENT EVALUATION IN THE PHARMACY LIPID CLINIC WITHIN 2 WEEKS  Your physician recommends that you schedule a follow-up appointment in: [place]        Signed, Almyra Deforest, [place]  05/22/2017 11:32 AM    [facility] [address], [place], [place]  [address] Phone: [phone]; Fax: [phone]

## 2017-05-21 DIAGNOSIS — Z0000 Encounter for general adult medical examination without abnormal findings: Secondary | ICD-10-CM | POA: Diagnosis not present

## 2017-05-21 DIAGNOSIS — L4050 Arthropathic psoriasis, unspecified: Secondary | ICD-10-CM | POA: Diagnosis not present

## 2017-05-21 DIAGNOSIS — I214 Non-ST elevation (NSTEMI) myocardial infarction: Secondary | ICD-10-CM | POA: Diagnosis not present

## 2017-05-21 DIAGNOSIS — I509 Heart failure, unspecified: Secondary | ICD-10-CM | POA: Diagnosis not present

## 2017-05-21 DIAGNOSIS — N184 Chronic kidney disease, stage 4 (severe): Secondary | ICD-10-CM | POA: Diagnosis not present

## 2017-05-21 DIAGNOSIS — I4891 Unspecified atrial fibrillation: Secondary | ICD-10-CM | POA: Diagnosis not present

## 2017-05-21 DIAGNOSIS — I10 Essential (primary) hypertension: Secondary | ICD-10-CM | POA: Diagnosis not present

## 2017-05-21 DIAGNOSIS — I2510 Atherosclerotic heart disease of native coronary artery without angina pectoris: Secondary | ICD-10-CM | POA: Diagnosis not present

## 2017-05-21 DIAGNOSIS — M6281 Muscle weakness (generalized): Secondary | ICD-10-CM | POA: Diagnosis not present

## 2017-05-22 ENCOUNTER — Encounter: Payer: Self-pay | Admitting: Physician Assistant

## 2017-05-25 ENCOUNTER — Telehealth: Payer: Self-pay | Admitting: Cardiovascular Disease

## 2017-05-25 ENCOUNTER — Other Ambulatory Visit: Payer: Self-pay

## 2017-05-25 DIAGNOSIS — Z992 Dependence on renal dialysis: Principal | ICD-10-CM

## 2017-05-25 DIAGNOSIS — N186 End stage renal disease: Secondary | ICD-10-CM

## 2017-05-25 DIAGNOSIS — I2510 Atherosclerotic heart disease of native coronary artery without angina pectoris: Secondary | ICD-10-CM | POA: Diagnosis not present

## 2017-05-25 DIAGNOSIS — I214 Non-ST elevation (NSTEMI) myocardial infarction: Secondary | ICD-10-CM | POA: Diagnosis not present

## 2017-05-25 NOTE — Telephone Encounter (Signed)
Follow up   Home health nurse returning call for nurse

## 2017-05-25 NOTE — Telephone Encounter (Signed)
°  New message  Pt home health RN verbalized that she is calling for RN  Pt seen pt and she hr is 34 and she want to know how low is Dr.Berry wanting it to get   STAT if HR is under 50 or over 120 (normal HR is 60-100 beats per minute)  1) What is your heart rate? 55  And dropped to 47  Do you have a log of your heart rate readings (document readings)? 55 and 47 Do you have any other symptoms? no

## 2017-05-25 NOTE — Telephone Encounter (Signed)
Left a message to call back.

## 2017-05-25 NOTE — Telephone Encounter (Signed)
Spoke with HHN and patients HR today 47, patient asymptomatic. Advised HR at recent visit was 80. She would like parameters on heart rate so she does not call when it get low. Will forward to [place] who saw patient 4/3

## 2017-05-26 DIAGNOSIS — N185 Chronic kidney disease, stage 5: Secondary | ICD-10-CM | POA: Diagnosis not present

## 2017-05-26 DIAGNOSIS — I120 Hypertensive chronic kidney disease with stage 5 chronic kidney disease or end stage renal disease: Secondary | ICD-10-CM | POA: Diagnosis not present

## 2017-05-26 DIAGNOSIS — E039 Hypothyroidism, unspecified: Secondary | ICD-10-CM | POA: Diagnosis not present

## 2017-05-26 DIAGNOSIS — I480 Paroxysmal atrial fibrillation: Secondary | ICD-10-CM | POA: Diagnosis not present

## 2017-05-26 DIAGNOSIS — M109 Gout, unspecified: Secondary | ICD-10-CM | POA: Diagnosis not present

## 2017-05-26 DIAGNOSIS — G4733 Obstructive sleep apnea (adult) (pediatric): Secondary | ICD-10-CM | POA: Diagnosis not present

## 2017-05-26 DIAGNOSIS — Z9989 Dependence on other enabling machines and devices: Secondary | ICD-10-CM | POA: Diagnosis not present

## 2017-05-26 DIAGNOSIS — N2581 Secondary hyperparathyroidism of renal origin: Secondary | ICD-10-CM | POA: Diagnosis not present

## 2017-05-26 DIAGNOSIS — I214 Non-ST elevation (NSTEMI) myocardial infarction: Secondary | ICD-10-CM | POA: Diagnosis not present

## 2017-05-26 DIAGNOSIS — I2510 Atherosclerotic heart disease of native coronary artery without angina pectoris: Secondary | ICD-10-CM | POA: Diagnosis not present

## 2017-05-26 DIAGNOSIS — D631 Anemia in chronic kidney disease: Secondary | ICD-10-CM | POA: Diagnosis not present

## 2017-05-26 DIAGNOSIS — N139 Obstructive and reflux uropathy, unspecified: Secondary | ICD-10-CM | POA: Diagnosis not present

## 2017-05-26 NOTE — Telephone Encounter (Signed)
He was asymptomatic with bradycardia during last office visit. When HR dips around 45 or below, that's when people become more symptomatic. Since he is so close, lets reduce amiodarone to 100mg  daily (1/2 of the current tablet)

## 2017-05-27 DIAGNOSIS — I2510 Atherosclerotic heart disease of native coronary artery without angina pectoris: Secondary | ICD-10-CM | POA: Diagnosis not present

## 2017-05-27 DIAGNOSIS — R338 Other retention of urine: Secondary | ICD-10-CM | POA: Diagnosis not present

## 2017-05-27 DIAGNOSIS — I214 Non-ST elevation (NSTEMI) myocardial infarction: Secondary | ICD-10-CM | POA: Diagnosis not present

## 2017-05-27 NOTE — Telephone Encounter (Signed)
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  should get pt up and assist him to the bathroom to check mobility   Pt BP is 140/68 please refer to note 05/25/2017 @3 :30pm

## 2017-05-27 NOTE — Telephone Encounter (Signed)
The phone call had been routed to [facility], [place]. He has spoken with the therapist and explained the prior note (documented in Epic). The patient's amiodarone was decreased to 100 mg daily to help increase the heart rate. The therapist has been made aware via phone call with [facility].

## 2017-05-28 ENCOUNTER — Ambulatory Visit (INDEPENDENT_AMBULATORY_CARE_PROVIDER_SITE_OTHER): Payer: Medicare Other | Admitting: Pharmacist

## 2017-05-28 DIAGNOSIS — I200 Unstable angina: Secondary | ICD-10-CM | POA: Diagnosis not present

## 2017-05-28 DIAGNOSIS — I2510 Atherosclerotic heart disease of native coronary artery without angina pectoris: Secondary | ICD-10-CM | POA: Diagnosis not present

## 2017-05-28 DIAGNOSIS — I25709 Atherosclerosis of coronary artery bypass graft(s), unspecified, with unspecified angina pectoris: Secondary | ICD-10-CM

## 2017-05-28 DIAGNOSIS — I214 Non-ST elevation (NSTEMI) myocardial infarction: Secondary | ICD-10-CM | POA: Diagnosis not present

## 2017-05-28 MED ORDER — ALIROCUMAB 150 MG/ML ~~LOC~~ SOPN: 150.0000 mg | PEN_INJECTOR | SUBCUTANEOUS | 0 refills | Status: DC

## 2017-05-28 MED ORDER — AMIODARONE HCL 200 MG PO TABS: 100.0000 mg | ORAL_TABLET | Freq: Every day | ORAL | 0 refills | Status: AC

## 2017-05-28 MED ORDER — ICOSAPENT ETHYL 1 G PO CAPS: 1.0000 | ORAL_CAPSULE | Freq: Two times a day (BID) | ORAL | 0 refills | Status: AC

## 2017-05-28 NOTE — Patient Instructions (Addendum)
[facility] (pharmacy)  Cameran Ahmed/Kristin [phone]  *STOP taking fish-oil* *START taking Vascepa 1 grams twice daily* *START Praluent 150mg  every 14 days* - paperwork started today *Repeat fasting blood work in 8 weeks*   Cholesterol Cholesterol is a fat. Your body needs a small amount of cholesterol. Cholesterol (plaque) may build up in your blood vessels (arteries). That makes you more likely to have a heart attack or stroke. You cannot feel your cholesterol level. Having a blood test is the only way to find out if your level is high. Keep your test results. Work with your doctor to keep your cholesterol at a good level. What do the results mean?  Total cholesterol is how much cholesterol is in your blood.  LDL is bad cholesterol. This is the type that can build up. Try to have low LDL.  HDL is good cholesterol. It cleans your blood vessels and carries LDL away. Try to have high HDL.  Triglycerides are fat that the body can store or burn for energy. What are good levels of cholesterol?  Total cholesterol below 200.  LDL below 100 is good for people who have health risks. LDL below 70 is good for people who have very high risks.  HDL above 40 is good. It is best to have HDL of 60 or higher.  Triglycerides below 150. How can I lower my cholesterol? Diet Follow your diet program as told by your doctor.  Choose fish, white meat chicken, or [place] that is roasted or baked. Try not to eat red meat, fried foods, sausage, or lunch meats.  Eat lots of fresh fruits and vegetables.  Choose whole grains, beans, pasta, potatoes, and cereals.  Choose olive oil, corn oil, or canola oil. Only use small amounts.  Try not to eat butter, mayonnaise, shortening, or palm kernel oils.  Try not to eat foods with trans fats.  Choose low-fat or nonfat dairy foods. ? Drink skim or nonfat milk. ? Eat low-fat or nonfat yogurt and cheeses. ? Try not to drink whole milk or cream. ? Try not  to eat ice cream, egg yolks, or full-fat cheeses.  Healthy desserts include angel food cake, ginger snaps, animal crackers, hard candy, popsicles, and low-fat or nonfat frozen yogurt. Try not to eat pastries, cakes, pies, and cookies.  Exercise Follow your exercise program as told by your doctor.  Be more active. Try gardening, walking, and taking the stairs.  Ask your doctor about ways that you can be more active.  Medicine  Take over-the-counter and prescription medicines only as told by your doctor. This information is not intended to replace advice given to you by your health care provider. Make sure you discuss any questions you have with your health care provider. Document Released: 05/02/2008 Document Revised: 09/05/2015 Document Reviewed: 08/16/2015 Elsevier Interactive Patient Education  [employer].

## 2017-05-28 NOTE — Progress Notes (Signed)
Patient ID: Nathan Wolfe                 DOB: 05-14-1945                    MRN: [id number]     HPI: Nathan Wolfe is a 72 y.o. male patient of Dr Nathan Wolfe referred to lipid clinic by Nathan Deforest PA. PMH is significant for CAD s/p CABG in 2000 and redo CABG in 2011, CKD stage IV, OSA, unstable angina, and HFpEF. Patient was taking Praluent 150mg  in the past but stopped using due to cost.  He presents today to lipid management and potential PCSK9 inhibitor initiation.  Current Medications: fish-oil 1g daily  Intolerances:  vytorin - muscle pain Atorvastatin 80mg  - severe muscle pain rosuvastatin 40mg  - severe muscle pain and unable to ambulate Ezetimibe 10mg  - lack therapeutic response  LDL goal: < 70mg /dL  Diet: low fat, low sodium diet  Exercise: PT/OT twice weekly  Family History: father multiple MI and aneurism; Afib brother; daughters with elevated TG  Social History: denies alcohol and tobacco use  Labs: 05/01/2017: CHO 232; TG 147; HDL 33; LDL-c 170  Past Medical History:  Diagnosis Date  . CAD (coronary artery disease)   . Carotid artery stenosis   . Chronic diastolic heart failure ([place])    noted on echo 05/03/2017 (EF 50-55%)  . Chronic kidney disease (CKD), stage IV (severe) ([place])   . Morbid obesity ([place])   . OSA on CPAP     Current Outpatient Medications on File Prior to Visit  Medication Sig Dispense Refill  . allopurinol (ZYLOPRIM) 100 MG tablet Take 100 mg by mouth daily.    Marland Kitchen ALPRAZolam (XANAX) 0.5 MG tablet Take 1 tablet (0.5 mg total) by mouth 3 (three) times daily as needed for anxiety. 30 tablet 0  . aspirin EC 81 MG tablet Take 81 mg by mouth at bedtime.    . brimonidine (ALPHAGAN) 0.2 % ophthalmic solution Place 1 drop into both eyes every other day.    . clopidogrel (PLAVIX) 75 MG tablet Take 75 mg by mouth daily.    Marland Kitchen doxazosin (CARDURA) 4 MG tablet Take 4 mg by mouth daily.    Marland Kitchen FLUoxetine (PROZAC) 20 MG tablet Take 20 mg by mouth daily.    .  isosorbide mononitrate (IMDUR) 120 MG 24 hr tablet Take 1 tablet (120 mg total) by mouth 2 (two) times daily. 180 tablet 3  . Melatonin 5 MG TABS Take 5 mg by mouth at bedtime.    . metoprolol tartrate (LOPRESSOR) 50 MG tablet Take 0.5 tablets (25 mg total) by mouth 2 (two) times daily. 30 tablet 0  . nitroGLYCERIN (NITROSTAT) 0.4 MG SL tablet Place 0.4 mg under the tongue every 5 (five) minutes as needed for chest pain.    . polyethylene glycol (MIRALAX / GLYCOLAX) packet Take 17 g by mouth 2 (two) times daily. 14 each 0  . ranolazine (RANEXA) 500 MG 12 hr tablet Take 1 tablet (500 mg total) by mouth 2 (two) times daily. 180 tablet 3  . Secukinumab (COSENTYX) 150 MG/ML SOSY Inject 300 mg into the skin every 30 (thirty) days.    . tamsulosin (FLOMAX) 0.4 MG CAPS capsule Take 1 capsule (0.4 mg total) by mouth daily after breakfast. 30 capsule 0  . torsemide (DEMADEX) 100 MG tablet Take 0.5 tablets (50 mg total) by mouth daily. If you gain more than 3 pounds in 24 hours please take 2 tablets  and call MD 60 tablet 6   No current facility-administered medications on file prior to visit.     Allergies  Allergen Reactions  . Statins Other (See Comments)    Full body muscle stuffiness/pain    CAD (coronary atherosclerotic disease) LDL remains above goal for secondary prevention. Noted history of statin intolerance and advance CKD; therefore , therapeutic options to manage his LDL and TG are limited.  Patient previously on Praleunt 150mg  every 14 days but stopped using due to cost. Also noted he is taking 1g of OTC fish-oil only.   Will re-submit paperwork for Praluent approval and complete application for patient assistance program. Fish-oil was discontinued and change to Vascepa 1g twice daily. Sampled were provided for Praleunt x1 dose, and Vascepa until prio-authorizations process completed. Plan to repeat fasting lipid panel in 8 weeks to re-assess therapy.   Dawid Dupriest Rodriguez-Guzman PharmD,  BCPS, [place] [address] 05/29/2017 4:58 PM

## 2017-05-29 ENCOUNTER — Telehealth: Payer: Self-pay | Admitting: Cardiovascular Disease

## 2017-05-29 ENCOUNTER — Encounter: Payer: Self-pay | Admitting: Physician Assistant

## 2017-05-29 ENCOUNTER — Encounter: Payer: Self-pay | Admitting: Pharmacist

## 2017-05-29 DIAGNOSIS — I214 Non-ST elevation (NSTEMI) myocardial infarction: Secondary | ICD-10-CM | POA: Diagnosis not present

## 2017-05-29 DIAGNOSIS — I2510 Atherosclerotic heart disease of native coronary artery without angina pectoris: Secondary | ICD-10-CM | POA: Diagnosis not present

## 2017-05-29 MED ORDER — ICOSAPENT ETHYL 1 G PO CAPS: 1.0000 g | ORAL_CAPSULE | Freq: Two times a day (BID) | ORAL | 3 refills | Status: AC

## 2017-05-29 NOTE — Telephone Encounter (Signed)
Spoke with [place] and she was aware of medication changes

## 2017-05-29 NOTE — Telephone Encounter (Signed)
Received records from [facility] On 05/29/17, Appt 06/17/17 @ 11:15am. [place]

## 2017-05-29 NOTE — Assessment & Plan Note (Signed)
LDL remains above goal for secondary prevention. Noted history of statin intolerance and advance CKD; therefore , therapeutic options to manage his LDL and TG are limited.  Patient previously on Praleunt 150mg  every 14 days but stopped using due to cost. Also noted he is taking 1g of OTC fish-oil only.   Will re-submit paperwork for Praluent approval and complete application for patient assistance program. Fish-oil was discontinued and change to Vascepa 1g twice daily. Sampled were provided for Praleunt x1 dose, and Vascepa until prio-authorizations process completed. Plan to repeat fasting lipid panel in 8 weeks to re-assess therapy.

## 2017-06-02 ENCOUNTER — Other Ambulatory Visit: Payer: Self-pay | Admitting: Pharmacist

## 2017-06-02 DIAGNOSIS — R338 Other retention of urine: Secondary | ICD-10-CM | POA: Diagnosis not present

## 2017-06-02 DIAGNOSIS — I214 Non-ST elevation (NSTEMI) myocardial infarction: Secondary | ICD-10-CM | POA: Diagnosis not present

## 2017-06-02 DIAGNOSIS — I2510 Atherosclerotic heart disease of native coronary artery without angina pectoris: Secondary | ICD-10-CM | POA: Diagnosis not present

## 2017-06-02 MED ORDER — ALIROCUMAB 150 MG/ML ~~LOC~~ SOPN: 150.0000 mg | PEN_INJECTOR | SUBCUTANEOUS | 11 refills | Status: AC

## 2017-06-03 DIAGNOSIS — I214 Non-ST elevation (NSTEMI) myocardial infarction: Secondary | ICD-10-CM | POA: Diagnosis not present

## 2017-06-03 DIAGNOSIS — I2510 Atherosclerotic heart disease of native coronary artery without angina pectoris: Secondary | ICD-10-CM | POA: Diagnosis not present

## 2017-06-05 DIAGNOSIS — I2510 Atherosclerotic heart disease of native coronary artery without angina pectoris: Secondary | ICD-10-CM | POA: Diagnosis not present

## 2017-06-05 DIAGNOSIS — I214 Non-ST elevation (NSTEMI) myocardial infarction: Secondary | ICD-10-CM | POA: Diagnosis not present

## 2017-06-09 DIAGNOSIS — I6523 Occlusion and stenosis of bilateral carotid arteries: Secondary | ICD-10-CM | POA: Diagnosis not present

## 2017-06-09 DIAGNOSIS — E559 Vitamin D deficiency, unspecified: Secondary | ICD-10-CM | POA: Diagnosis not present

## 2017-06-09 DIAGNOSIS — R600 Localized edema: Secondary | ICD-10-CM | POA: Diagnosis not present

## 2017-06-09 DIAGNOSIS — I6529 Occlusion and stenosis of unspecified carotid artery: Secondary | ICD-10-CM | POA: Diagnosis not present

## 2017-06-09 DIAGNOSIS — N184 Chronic kidney disease, stage 4 (severe): Secondary | ICD-10-CM | POA: Diagnosis not present

## 2017-06-09 DIAGNOSIS — R0602 Shortness of breath: Secondary | ICD-10-CM | POA: Diagnosis not present

## 2017-06-09 DIAGNOSIS — I10 Essential (primary) hypertension: Secondary | ICD-10-CM | POA: Diagnosis not present

## 2017-06-09 DIAGNOSIS — Z79899 Other long term (current) drug therapy: Secondary | ICD-10-CM | POA: Diagnosis not present

## 2017-06-11 DIAGNOSIS — R339 Retention of urine, unspecified: Secondary | ICD-10-CM | POA: Diagnosis not present

## 2017-06-11 DIAGNOSIS — Z6837 Body mass index (BMI) 37.0-37.9, adult: Secondary | ICD-10-CM | POA: Diagnosis not present

## 2017-06-11 DIAGNOSIS — G4733 Obstructive sleep apnea (adult) (pediatric): Secondary | ICD-10-CM | POA: Diagnosis not present

## 2017-06-11 DIAGNOSIS — N189 Chronic kidney disease, unspecified: Secondary | ICD-10-CM | POA: Diagnosis not present

## 2017-06-11 DIAGNOSIS — I2510 Atherosclerotic heart disease of native coronary artery without angina pectoris: Secondary | ICD-10-CM | POA: Diagnosis not present

## 2017-06-11 DIAGNOSIS — I10 Essential (primary) hypertension: Secondary | ICD-10-CM | POA: Diagnosis not present

## 2017-06-11 DIAGNOSIS — E785 Hyperlipidemia, unspecified: Secondary | ICD-10-CM | POA: Diagnosis not present

## 2017-06-15 DIAGNOSIS — K5900 Constipation, unspecified: Secondary | ICD-10-CM | POA: Diagnosis not present

## 2017-06-15 DIAGNOSIS — K5641 Fecal impaction: Secondary | ICD-10-CM | POA: Diagnosis not present

## 2017-06-16 DIAGNOSIS — I340 Nonrheumatic mitral (valve) insufficiency: Secondary | ICD-10-CM | POA: Diagnosis not present

## 2017-06-16 DIAGNOSIS — I252 Old myocardial infarction: Secondary | ICD-10-CM | POA: Diagnosis not present

## 2017-06-16 DIAGNOSIS — I068 Other rheumatic aortic valve diseases: Secondary | ICD-10-CM | POA: Diagnosis not present

## 2017-06-17 ENCOUNTER — Ambulatory Visit: Payer: PRIVATE HEALTH INSURANCE | Admitting: Cardiovascular Disease

## 2017-06-24 DIAGNOSIS — I10 Essential (primary) hypertension: Secondary | ICD-10-CM | POA: Diagnosis not present

## 2017-06-24 DIAGNOSIS — N184 Chronic kidney disease, stage 4 (severe): Secondary | ICD-10-CM | POA: Diagnosis not present

## 2017-06-24 DIAGNOSIS — I2510 Atherosclerotic heart disease of native coronary artery without angina pectoris: Secondary | ICD-10-CM | POA: Diagnosis not present

## 2017-06-29 DIAGNOSIS — E782 Mixed hyperlipidemia: Secondary | ICD-10-CM | POA: Diagnosis not present

## 2017-06-29 DIAGNOSIS — N184 Chronic kidney disease, stage 4 (severe): Secondary | ICD-10-CM | POA: Diagnosis not present

## 2017-06-29 DIAGNOSIS — I6523 Occlusion and stenosis of bilateral carotid arteries: Secondary | ICD-10-CM | POA: Diagnosis not present

## 2017-06-29 DIAGNOSIS — Z951 Presence of aortocoronary bypass graft: Secondary | ICD-10-CM | POA: Diagnosis not present

## 2017-07-01 DIAGNOSIS — N401 Enlarged prostate with lower urinary tract symptoms: Secondary | ICD-10-CM | POA: Diagnosis not present

## 2017-07-01 DIAGNOSIS — R339 Retention of urine, unspecified: Secondary | ICD-10-CM | POA: Diagnosis not present

## 2017-07-02 DIAGNOSIS — I252 Old myocardial infarction: Secondary | ICD-10-CM | POA: Diagnosis not present

## 2017-07-02 DIAGNOSIS — Z9884 Bariatric surgery status: Secondary | ICD-10-CM | POA: Diagnosis not present

## 2017-07-02 DIAGNOSIS — E7800 Pure hypercholesterolemia, unspecified: Secondary | ICD-10-CM | POA: Diagnosis not present

## 2017-07-02 DIAGNOSIS — R319 Hematuria, unspecified: Secondary | ICD-10-CM | POA: Diagnosis not present

## 2017-07-02 DIAGNOSIS — I129 Hypertensive chronic kidney disease with stage 1 through stage 4 chronic kidney disease, or unspecified chronic kidney disease: Secondary | ICD-10-CM | POA: Diagnosis not present

## 2017-07-02 DIAGNOSIS — Z7982 Long term (current) use of aspirin: Secondary | ICD-10-CM | POA: Diagnosis not present

## 2017-07-02 DIAGNOSIS — I509 Heart failure, unspecified: Secondary | ICD-10-CM | POA: Diagnosis not present

## 2017-07-02 DIAGNOSIS — Z79899 Other long term (current) drug therapy: Secondary | ICD-10-CM | POA: Diagnosis not present

## 2017-07-02 DIAGNOSIS — R339 Retention of urine, unspecified: Secondary | ICD-10-CM | POA: Diagnosis not present

## 2017-07-02 DIAGNOSIS — I110 Hypertensive heart disease with heart failure: Secondary | ICD-10-CM | POA: Diagnosis not present

## 2017-07-02 DIAGNOSIS — I25810 Atherosclerosis of coronary artery bypass graft(s) without angina pectoris: Secondary | ICD-10-CM | POA: Diagnosis not present

## 2017-07-02 DIAGNOSIS — N189 Chronic kidney disease, unspecified: Secondary | ICD-10-CM | POA: Diagnosis not present

## 2017-07-02 DIAGNOSIS — Z955 Presence of coronary angioplasty implant and graft: Secondary | ICD-10-CM | POA: Diagnosis not present

## 2017-07-02 DIAGNOSIS — Z7901 Long term (current) use of anticoagulants: Secondary | ICD-10-CM | POA: Diagnosis not present

## 2017-07-04 DIAGNOSIS — I2510 Atherosclerotic heart disease of native coronary artery without angina pectoris: Secondary | ICD-10-CM | POA: Diagnosis not present

## 2017-07-04 DIAGNOSIS — Z79899 Other long term (current) drug therapy: Secondary | ICD-10-CM | POA: Diagnosis not present

## 2017-07-04 DIAGNOSIS — R319 Hematuria, unspecified: Secondary | ICD-10-CM | POA: Diagnosis not present

## 2017-07-04 DIAGNOSIS — Z951 Presence of aortocoronary bypass graft: Secondary | ICD-10-CM | POA: Diagnosis not present

## 2017-07-04 DIAGNOSIS — T8384XA Pain from genitourinary prosthetic devices, implants and grafts, initial encounter: Secondary | ICD-10-CM | POA: Diagnosis not present

## 2017-07-04 DIAGNOSIS — Z7901 Long term (current) use of anticoagulants: Secondary | ICD-10-CM | POA: Diagnosis not present

## 2017-07-04 DIAGNOSIS — Z792 Long term (current) use of antibiotics: Secondary | ICD-10-CM | POA: Diagnosis not present

## 2017-07-04 DIAGNOSIS — N189 Chronic kidney disease, unspecified: Secondary | ICD-10-CM | POA: Diagnosis not present

## 2017-07-04 DIAGNOSIS — R338 Other retention of urine: Secondary | ICD-10-CM | POA: Diagnosis not present

## 2017-07-04 DIAGNOSIS — E7800 Pure hypercholesterolemia, unspecified: Secondary | ICD-10-CM | POA: Diagnosis not present

## 2017-07-04 DIAGNOSIS — I509 Heart failure, unspecified: Secondary | ICD-10-CM | POA: Diagnosis not present

## 2017-07-04 DIAGNOSIS — I4891 Unspecified atrial fibrillation: Secondary | ICD-10-CM | POA: Diagnosis not present

## 2017-07-04 DIAGNOSIS — I130 Hypertensive heart and chronic kidney disease with heart failure and stage 1 through stage 4 chronic kidney disease, or unspecified chronic kidney disease: Secondary | ICD-10-CM | POA: Diagnosis not present

## 2017-07-04 DIAGNOSIS — R300 Dysuria: Secondary | ICD-10-CM | POA: Diagnosis not present

## 2017-07-04 DIAGNOSIS — T83091A Other mechanical complication of indwelling urethral catheter, initial encounter: Secondary | ICD-10-CM | POA: Diagnosis not present

## 2017-07-04 DIAGNOSIS — Z7982 Long term (current) use of aspirin: Secondary | ICD-10-CM | POA: Diagnosis not present

## 2017-07-06 DIAGNOSIS — R339 Retention of urine, unspecified: Secondary | ICD-10-CM | POA: Diagnosis not present

## 2017-07-07 ENCOUNTER — Encounter (HOSPITAL_COMMUNITY): Payer: PRIVATE HEALTH INSURANCE

## 2017-07-07 ENCOUNTER — Encounter: Payer: PRIVATE HEALTH INSURANCE | Admitting: Vascular Surgery

## 2017-07-14 DIAGNOSIS — R339 Retention of urine, unspecified: Secondary | ICD-10-CM | POA: Diagnosis not present

## 2017-07-14 DIAGNOSIS — N401 Enlarged prostate with lower urinary tract symptoms: Secondary | ICD-10-CM | POA: Diagnosis not present

## 2017-07-15 ENCOUNTER — Encounter (INDEPENDENT_AMBULATORY_CARE_PROVIDER_SITE_OTHER): Payer: Self-pay

## 2017-07-21 DIAGNOSIS — I480 Paroxysmal atrial fibrillation: Secondary | ICD-10-CM | POA: Diagnosis not present

## 2017-07-21 DIAGNOSIS — I25111 Atherosclerotic heart disease of native coronary artery with angina pectoris with documented spasm: Secondary | ICD-10-CM | POA: Diagnosis not present

## 2017-07-21 DIAGNOSIS — I6523 Occlusion and stenosis of bilateral carotid arteries: Secondary | ICD-10-CM | POA: Diagnosis not present

## 2017-07-21 DIAGNOSIS — R001 Bradycardia, unspecified: Secondary | ICD-10-CM | POA: Diagnosis not present

## 2017-07-23 DIAGNOSIS — L218 Other seborrheic dermatitis: Secondary | ICD-10-CM | POA: Diagnosis not present

## 2017-07-23 DIAGNOSIS — Z79899 Other long term (current) drug therapy: Secondary | ICD-10-CM | POA: Diagnosis not present

## 2017-07-23 DIAGNOSIS — I10 Essential (primary) hypertension: Secondary | ICD-10-CM | POA: Diagnosis not present

## 2017-07-23 DIAGNOSIS — L400 Psoriasis vulgaris: Secondary | ICD-10-CM | POA: Diagnosis not present

## 2017-07-23 DIAGNOSIS — R739 Hyperglycemia, unspecified: Secondary | ICD-10-CM | POA: Diagnosis not present

## 2017-07-23 DIAGNOSIS — N184 Chronic kidney disease, stage 4 (severe): Secondary | ICD-10-CM | POA: Diagnosis not present

## 2017-07-23 DIAGNOSIS — I6529 Occlusion and stenosis of unspecified carotid artery: Secondary | ICD-10-CM | POA: Diagnosis not present

## 2017-07-23 DIAGNOSIS — N62 Hypertrophy of breast: Secondary | ICD-10-CM | POA: Diagnosis not present

## 2017-07-23 DIAGNOSIS — I2510 Atherosclerotic heart disease of native coronary artery without angina pectoris: Secondary | ICD-10-CM | POA: Diagnosis not present

## 2017-08-04 DIAGNOSIS — R001 Bradycardia, unspecified: Secondary | ICD-10-CM | POA: Diagnosis not present

## 2017-08-04 DIAGNOSIS — I480 Paroxysmal atrial fibrillation: Secondary | ICD-10-CM | POA: Diagnosis not present

## 2017-08-05 DIAGNOSIS — I10 Essential (primary) hypertension: Secondary | ICD-10-CM | POA: Diagnosis not present

## 2017-08-05 DIAGNOSIS — N184 Chronic kidney disease, stage 4 (severe): Secondary | ICD-10-CM | POA: Diagnosis not present

## 2017-08-05 DIAGNOSIS — I2510 Atherosclerotic heart disease of native coronary artery without angina pectoris: Secondary | ICD-10-CM | POA: Diagnosis not present

## 2017-08-21 DIAGNOSIS — R922 Inconclusive mammogram: Secondary | ICD-10-CM | POA: Diagnosis not present

## 2017-08-21 DIAGNOSIS — N6489 Other specified disorders of breast: Secondary | ICD-10-CM | POA: Diagnosis not present

## 2017-08-21 DIAGNOSIS — N644 Mastodynia: Secondary | ICD-10-CM | POA: Diagnosis not present

## 2017-08-31 DIAGNOSIS — I6523 Occlusion and stenosis of bilateral carotid arteries: Secondary | ICD-10-CM | POA: Diagnosis not present

## 2017-08-31 DIAGNOSIS — Z951 Presence of aortocoronary bypass graft: Secondary | ICD-10-CM | POA: Diagnosis not present

## 2017-08-31 DIAGNOSIS — E782 Mixed hyperlipidemia: Secondary | ICD-10-CM | POA: Diagnosis not present

## 2017-08-31 DIAGNOSIS — N184 Chronic kidney disease, stage 4 (severe): Secondary | ICD-10-CM | POA: Diagnosis not present

## 2017-09-23 DIAGNOSIS — N184 Chronic kidney disease, stage 4 (severe): Secondary | ICD-10-CM | POA: Diagnosis not present

## 2017-09-23 DIAGNOSIS — I2510 Atherosclerotic heart disease of native coronary artery without angina pectoris: Secondary | ICD-10-CM | POA: Diagnosis not present

## 2017-09-23 DIAGNOSIS — I10 Essential (primary) hypertension: Secondary | ICD-10-CM | POA: Diagnosis not present

## 2017-09-23 DIAGNOSIS — R739 Hyperglycemia, unspecified: Secondary | ICD-10-CM | POA: Diagnosis not present

## 2017-09-30 DIAGNOSIS — N184 Chronic kidney disease, stage 4 (severe): Secondary | ICD-10-CM | POA: Diagnosis not present

## 2017-09-30 DIAGNOSIS — M109 Gout, unspecified: Secondary | ICD-10-CM | POA: Diagnosis not present

## 2017-09-30 DIAGNOSIS — I2510 Atherosclerotic heart disease of native coronary artery without angina pectoris: Secondary | ICD-10-CM | POA: Diagnosis not present

## 2017-09-30 DIAGNOSIS — I10 Essential (primary) hypertension: Secondary | ICD-10-CM | POA: Diagnosis not present

## 2017-10-13 DIAGNOSIS — N184 Chronic kidney disease, stage 4 (severe): Secondary | ICD-10-CM | POA: Diagnosis not present

## 2017-11-13 DIAGNOSIS — I6529 Occlusion and stenosis of unspecified carotid artery: Secondary | ICD-10-CM | POA: Diagnosis not present

## 2017-11-13 DIAGNOSIS — R739 Hyperglycemia, unspecified: Secondary | ICD-10-CM | POA: Diagnosis not present

## 2017-11-13 DIAGNOSIS — N184 Chronic kidney disease, stage 4 (severe): Secondary | ICD-10-CM | POA: Diagnosis not present

## 2017-11-13 DIAGNOSIS — M109 Gout, unspecified: Secondary | ICD-10-CM | POA: Diagnosis not present

## 2017-11-13 DIAGNOSIS — I2510 Atherosclerotic heart disease of native coronary artery without angina pectoris: Secondary | ICD-10-CM | POA: Diagnosis not present

## 2017-11-13 DIAGNOSIS — I25118 Atherosclerotic heart disease of native coronary artery with other forms of angina pectoris: Secondary | ICD-10-CM | POA: Diagnosis not present

## 2017-11-13 DIAGNOSIS — I252 Old myocardial infarction: Secondary | ICD-10-CM | POA: Diagnosis not present

## 2017-11-13 DIAGNOSIS — I10 Essential (primary) hypertension: Secondary | ICD-10-CM | POA: Diagnosis not present

## 2017-11-13 DIAGNOSIS — I480 Paroxysmal atrial fibrillation: Secondary | ICD-10-CM | POA: Diagnosis not present

## 2017-11-21 DIAGNOSIS — Z23 Encounter for immunization: Secondary | ICD-10-CM | POA: Diagnosis not present

## 2017-11-25 DIAGNOSIS — N184 Chronic kidney disease, stage 4 (severe): Secondary | ICD-10-CM | POA: Diagnosis not present

## 2017-11-25 DIAGNOSIS — I129 Hypertensive chronic kidney disease with stage 1 through stage 4 chronic kidney disease, or unspecified chronic kidney disease: Secondary | ICD-10-CM | POA: Diagnosis not present

## 2017-11-25 DIAGNOSIS — I2510 Atherosclerotic heart disease of native coronary artery without angina pectoris: Secondary | ICD-10-CM | POA: Diagnosis not present

## 2018-01-25 DIAGNOSIS — N184 Chronic kidney disease, stage 4 (severe): Secondary | ICD-10-CM | POA: Diagnosis not present

## 2018-01-25 DIAGNOSIS — I6523 Occlusion and stenosis of bilateral carotid arteries: Secondary | ICD-10-CM | POA: Diagnosis not present

## 2018-01-25 DIAGNOSIS — E782 Mixed hyperlipidemia: Secondary | ICD-10-CM | POA: Diagnosis not present

## 2018-01-28 DIAGNOSIS — R21 Rash and other nonspecific skin eruption: Secondary | ICD-10-CM | POA: Diagnosis not present

## 2018-01-28 DIAGNOSIS — Z79899 Other long term (current) drug therapy: Secondary | ICD-10-CM | POA: Diagnosis not present

## 2018-01-28 DIAGNOSIS — L4059 Other psoriatic arthropathy: Secondary | ICD-10-CM | POA: Diagnosis not present

## 2018-01-28 DIAGNOSIS — L400 Psoriasis vulgaris: Secondary | ICD-10-CM | POA: Diagnosis not present

## 2018-01-28 DIAGNOSIS — L84 Corns and callosities: Secondary | ICD-10-CM | POA: Diagnosis not present

## 2018-01-28 DIAGNOSIS — D1801 Hemangioma of skin and subcutaneous tissue: Secondary | ICD-10-CM | POA: Diagnosis not present

## 2018-01-28 DIAGNOSIS — L821 Other seborrheic keratosis: Secondary | ICD-10-CM | POA: Diagnosis not present

## 2018-01-28 DIAGNOSIS — L905 Scar conditions and fibrosis of skin: Secondary | ICD-10-CM | POA: Diagnosis not present

## 2018-02-25 DIAGNOSIS — I2510 Atherosclerotic heart disease of native coronary artery without angina pectoris: Secondary | ICD-10-CM | POA: Diagnosis not present

## 2018-02-25 DIAGNOSIS — I6529 Occlusion and stenosis of unspecified carotid artery: Secondary | ICD-10-CM | POA: Diagnosis not present

## 2018-02-25 DIAGNOSIS — R739 Hyperglycemia, unspecified: Secondary | ICD-10-CM | POA: Diagnosis not present

## 2018-02-25 DIAGNOSIS — M109 Gout, unspecified: Secondary | ICD-10-CM | POA: Diagnosis not present

## 2018-02-25 DIAGNOSIS — I10 Essential (primary) hypertension: Secondary | ICD-10-CM | POA: Diagnosis not present

## 2018-02-25 DIAGNOSIS — N184 Chronic kidney disease, stage 4 (severe): Secondary | ICD-10-CM | POA: Diagnosis not present

## 2018-03-03 DIAGNOSIS — M10071 Idiopathic gout, right ankle and foot: Secondary | ICD-10-CM | POA: Diagnosis not present

## 2018-03-03 DIAGNOSIS — I25810 Atherosclerosis of coronary artery bypass graft(s) without angina pectoris: Secondary | ICD-10-CM | POA: Diagnosis not present

## 2018-03-03 DIAGNOSIS — I517 Cardiomegaly: Secondary | ICD-10-CM | POA: Diagnosis not present

## 2018-03-03 DIAGNOSIS — R0602 Shortness of breath: Secondary | ICD-10-CM | POA: Diagnosis not present

## 2018-03-03 DIAGNOSIS — I10 Essential (primary) hypertension: Secondary | ICD-10-CM | POA: Diagnosis not present

## 2018-03-03 DIAGNOSIS — R7989 Other specified abnormal findings of blood chemistry: Secondary | ICD-10-CM | POA: Diagnosis not present

## 2018-03-03 DIAGNOSIS — N184 Chronic kidney disease, stage 4 (severe): Secondary | ICD-10-CM | POA: Diagnosis not present

## 2018-04-12 DIAGNOSIS — H401121 Primary open-angle glaucoma, left eye, mild stage: Secondary | ICD-10-CM | POA: Diagnosis not present

## 2018-04-12 DIAGNOSIS — H401112 Primary open-angle glaucoma, right eye, moderate stage: Secondary | ICD-10-CM | POA: Diagnosis not present

## 2018-04-12 DIAGNOSIS — H47323 Drusen of optic disc, bilateral: Secondary | ICD-10-CM | POA: Diagnosis not present

## 2018-04-12 DIAGNOSIS — H25813 Combined forms of age-related cataract, bilateral: Secondary | ICD-10-CM | POA: Diagnosis not present

## 2018-04-12 DIAGNOSIS — H5319 Other subjective visual disturbances: Secondary | ICD-10-CM | POA: Diagnosis not present

## 2018-04-14 DIAGNOSIS — H353221 Exudative age-related macular degeneration, left eye, with active choroidal neovascularization: Secondary | ICD-10-CM | POA: Diagnosis not present

## 2018-04-14 DIAGNOSIS — H34231 Retinal artery branch occlusion, right eye: Secondary | ICD-10-CM | POA: Diagnosis not present

## 2018-04-19 DIAGNOSIS — I4811 Longstanding persistent atrial fibrillation: Secondary | ICD-10-CM | POA: Diagnosis not present

## 2018-04-19 DIAGNOSIS — R001 Bradycardia, unspecified: Secondary | ICD-10-CM | POA: Diagnosis not present

## 2018-04-19 DIAGNOSIS — R0602 Shortness of breath: Secondary | ICD-10-CM | POA: Diagnosis not present

## 2018-04-19 DIAGNOSIS — I6523 Occlusion and stenosis of bilateral carotid arteries: Secondary | ICD-10-CM | POA: Diagnosis not present

## 2018-04-22 DIAGNOSIS — E785 Hyperlipidemia, unspecified: Secondary | ICD-10-CM | POA: Diagnosis not present

## 2018-04-22 DIAGNOSIS — Z79899 Other long term (current) drug therapy: Secondary | ICD-10-CM | POA: Diagnosis not present

## 2018-05-19 DIAGNOSIS — H353221 Exudative age-related macular degeneration, left eye, with active choroidal neovascularization: Secondary | ICD-10-CM | POA: Diagnosis not present

## 2018-05-20 DIAGNOSIS — N189 Chronic kidney disease, unspecified: Secondary | ICD-10-CM | POA: Diagnosis not present

## 2018-05-20 DIAGNOSIS — I5032 Chronic diastolic (congestive) heart failure: Secondary | ICD-10-CM | POA: Diagnosis not present

## 2018-05-20 DIAGNOSIS — I480 Paroxysmal atrial fibrillation: Secondary | ICD-10-CM | POA: Diagnosis not present

## 2018-05-20 DIAGNOSIS — I25118 Atherosclerotic heart disease of native coronary artery with other forms of angina pectoris: Secondary | ICD-10-CM | POA: Diagnosis not present

## 2018-06-08 DIAGNOSIS — R0602 Shortness of breath: Secondary | ICD-10-CM | POA: Diagnosis not present

## 2018-06-08 DIAGNOSIS — N184 Chronic kidney disease, stage 4 (severe): Secondary | ICD-10-CM | POA: Diagnosis not present

## 2018-06-08 DIAGNOSIS — R7989 Other specified abnormal findings of blood chemistry: Secondary | ICD-10-CM | POA: Diagnosis not present

## 2018-06-16 DIAGNOSIS — R7989 Other specified abnormal findings of blood chemistry: Secondary | ICD-10-CM | POA: Diagnosis not present

## 2018-06-16 DIAGNOSIS — I10 Essential (primary) hypertension: Secondary | ICD-10-CM | POA: Diagnosis not present

## 2018-06-16 DIAGNOSIS — M10071 Idiopathic gout, right ankle and foot: Secondary | ICD-10-CM | POA: Diagnosis not present

## 2018-06-16 DIAGNOSIS — N184 Chronic kidney disease, stage 4 (severe): Secondary | ICD-10-CM | POA: Diagnosis not present

## 2018-06-16 DIAGNOSIS — R0602 Shortness of breath: Secondary | ICD-10-CM | POA: Diagnosis not present

## 2018-06-16 DIAGNOSIS — I25810 Atherosclerosis of coronary artery bypass graft(s) without angina pectoris: Secondary | ICD-10-CM | POA: Diagnosis not present

## 2018-07-14 DIAGNOSIS — H353221 Exudative age-related macular degeneration, left eye, with active choroidal neovascularization: Secondary | ICD-10-CM | POA: Diagnosis not present

## 2018-08-02 DIAGNOSIS — I6523 Occlusion and stenosis of bilateral carotid arteries: Secondary | ICD-10-CM | POA: Diagnosis not present

## 2018-08-09 DIAGNOSIS — L4059 Other psoriatic arthropathy: Secondary | ICD-10-CM | POA: Diagnosis not present

## 2018-08-09 DIAGNOSIS — L400 Psoriasis vulgaris: Secondary | ICD-10-CM | POA: Diagnosis not present

## 2018-08-09 DIAGNOSIS — Z79899 Other long term (current) drug therapy: Secondary | ICD-10-CM | POA: Diagnosis not present

## 2018-08-09 DIAGNOSIS — N184 Chronic kidney disease, stage 4 (severe): Secondary | ICD-10-CM | POA: Diagnosis not present

## 2018-08-09 DIAGNOSIS — L821 Other seborrheic keratosis: Secondary | ICD-10-CM | POA: Diagnosis not present

## 2018-08-11 DIAGNOSIS — I25810 Atherosclerosis of coronary artery bypass graft(s) without angina pectoris: Secondary | ICD-10-CM | POA: Diagnosis not present

## 2018-08-11 DIAGNOSIS — I10 Essential (primary) hypertension: Secondary | ICD-10-CM | POA: Diagnosis not present

## 2018-08-11 DIAGNOSIS — R7989 Other specified abnormal findings of blood chemistry: Secondary | ICD-10-CM | POA: Diagnosis not present

## 2018-08-11 DIAGNOSIS — N184 Chronic kidney disease, stage 4 (severe): Secondary | ICD-10-CM | POA: Diagnosis not present

## 2018-08-11 DIAGNOSIS — M10071 Idiopathic gout, right ankle and foot: Secondary | ICD-10-CM | POA: Diagnosis not present

## 2018-08-11 DIAGNOSIS — R0602 Shortness of breath: Secondary | ICD-10-CM | POA: Diagnosis not present

## 2018-08-12 DIAGNOSIS — N184 Chronic kidney disease, stage 4 (severe): Secondary | ICD-10-CM | POA: Diagnosis not present

## 2018-08-12 DIAGNOSIS — I6523 Occlusion and stenosis of bilateral carotid arteries: Secondary | ICD-10-CM | POA: Diagnosis not present

## 2018-08-12 DIAGNOSIS — E782 Mixed hyperlipidemia: Secondary | ICD-10-CM | POA: Diagnosis not present

## 2018-08-16 DIAGNOSIS — R079 Chest pain, unspecified: Secondary | ICD-10-CM | POA: Diagnosis not present

## 2018-08-16 DIAGNOSIS — R0609 Other forms of dyspnea: Secondary | ICD-10-CM | POA: Diagnosis not present

## 2018-08-16 DIAGNOSIS — Z9981 Dependence on supplemental oxygen: Secondary | ICD-10-CM | POA: Diagnosis not present

## 2018-08-17 DIAGNOSIS — Z955 Presence of coronary angioplasty implant and graft: Secondary | ICD-10-CM | POA: Diagnosis not present

## 2018-08-17 DIAGNOSIS — R918 Other nonspecific abnormal finding of lung field: Secondary | ICD-10-CM | POA: Diagnosis not present

## 2018-08-17 DIAGNOSIS — N189 Chronic kidney disease, unspecified: Secondary | ICD-10-CM | POA: Diagnosis not present

## 2018-08-17 DIAGNOSIS — Z992 Dependence on renal dialysis: Secondary | ICD-10-CM | POA: Diagnosis not present

## 2018-08-17 DIAGNOSIS — I509 Heart failure, unspecified: Secondary | ICD-10-CM | POA: Diagnosis not present

## 2018-08-17 DIAGNOSIS — R0602 Shortness of breath: Secondary | ICD-10-CM | POA: Diagnosis not present

## 2018-08-17 DIAGNOSIS — R079 Chest pain, unspecified: Secondary | ICD-10-CM | POA: Diagnosis not present

## 2018-08-17 DIAGNOSIS — I130 Hypertensive heart and chronic kidney disease with heart failure and stage 1 through stage 4 chronic kidney disease, or unspecified chronic kidney disease: Secondary | ICD-10-CM | POA: Diagnosis not present

## 2018-08-17 DIAGNOSIS — I2510 Atherosclerotic heart disease of native coronary artery without angina pectoris: Secondary | ICD-10-CM | POA: Diagnosis not present

## 2018-08-23 DIAGNOSIS — I10 Essential (primary) hypertension: Secondary | ICD-10-CM | POA: Diagnosis not present

## 2018-08-23 DIAGNOSIS — R0602 Shortness of breath: Secondary | ICD-10-CM | POA: Diagnosis not present

## 2018-08-23 DIAGNOSIS — R9431 Abnormal electrocardiogram [ECG] [EKG]: Secondary | ICD-10-CM | POA: Diagnosis not present

## 2018-08-23 DIAGNOSIS — I25111 Atherosclerotic heart disease of native coronary artery with angina pectoris with documented spasm: Secondary | ICD-10-CM | POA: Diagnosis not present

## 2018-09-06 DIAGNOSIS — I351 Nonrheumatic aortic (valve) insufficiency: Secondary | ICD-10-CM | POA: Diagnosis not present

## 2018-09-06 DIAGNOSIS — R0602 Shortness of breath: Secondary | ICD-10-CM | POA: Diagnosis not present

## 2018-09-06 DIAGNOSIS — I361 Nonrheumatic tricuspid (valve) insufficiency: Secondary | ICD-10-CM | POA: Diagnosis not present

## 2018-09-06 DIAGNOSIS — I340 Nonrheumatic mitral (valve) insufficiency: Secondary | ICD-10-CM | POA: Diagnosis not present

## 2018-09-20 DIAGNOSIS — N281 Cyst of kidney, acquired: Secondary | ICD-10-CM | POA: Diagnosis not present

## 2018-09-20 DIAGNOSIS — I517 Cardiomegaly: Secondary | ICD-10-CM | POA: Diagnosis not present

## 2018-09-20 DIAGNOSIS — K5730 Diverticulosis of large intestine without perforation or abscess without bleeding: Secondary | ICD-10-CM | POA: Diagnosis not present

## 2018-09-20 DIAGNOSIS — I2510 Atherosclerotic heart disease of native coronary artery without angina pectoris: Secondary | ICD-10-CM | POA: Diagnosis not present

## 2018-09-20 DIAGNOSIS — K8080 Other cholelithiasis without obstruction: Secondary | ICD-10-CM | POA: Diagnosis not present

## 2018-09-20 DIAGNOSIS — E669 Obesity, unspecified: Secondary | ICD-10-CM | POA: Diagnosis not present

## 2018-09-20 DIAGNOSIS — K8020 Calculus of gallbladder without cholecystitis without obstruction: Secondary | ICD-10-CM | POA: Diagnosis not present

## 2018-09-20 DIAGNOSIS — R079 Chest pain, unspecified: Secondary | ICD-10-CM | POA: Diagnosis not present

## 2018-09-20 DIAGNOSIS — I130 Hypertensive heart and chronic kidney disease with heart failure and stage 1 through stage 4 chronic kidney disease, or unspecified chronic kidney disease: Secondary | ICD-10-CM | POA: Diagnosis not present

## 2018-09-20 DIAGNOSIS — I509 Heart failure, unspecified: Secondary | ICD-10-CM | POA: Diagnosis not present

## 2018-09-20 DIAGNOSIS — Z6839 Body mass index (BMI) 39.0-39.9, adult: Secondary | ICD-10-CM | POA: Diagnosis not present

## 2018-09-20 DIAGNOSIS — R918 Other nonspecific abnormal finding of lung field: Secondary | ICD-10-CM | POA: Diagnosis not present

## 2018-09-20 DIAGNOSIS — N289 Disorder of kidney and ureter, unspecified: Secondary | ICD-10-CM | POA: Diagnosis not present

## 2018-09-20 DIAGNOSIS — N189 Chronic kidney disease, unspecified: Secondary | ICD-10-CM | POA: Diagnosis not present

## 2018-09-25 DIAGNOSIS — I2510 Atherosclerotic heart disease of native coronary artery without angina pectoris: Secondary | ICD-10-CM | POA: Diagnosis not present

## 2018-09-25 DIAGNOSIS — N189 Chronic kidney disease, unspecified: Secondary | ICD-10-CM | POA: Diagnosis not present

## 2018-09-25 DIAGNOSIS — Z743 Need for continuous supervision: Secondary | ICD-10-CM | POA: Diagnosis not present

## 2018-09-25 DIAGNOSIS — I10 Essential (primary) hypertension: Secondary | ICD-10-CM | POA: Diagnosis not present

## 2018-09-25 DIAGNOSIS — R0602 Shortness of breath: Secondary | ICD-10-CM | POA: Diagnosis not present

## 2018-09-25 DIAGNOSIS — R079 Chest pain, unspecified: Secondary | ICD-10-CM | POA: Diagnosis not present

## 2018-09-25 DIAGNOSIS — I214 Non-ST elevation (NSTEMI) myocardial infarction: Secondary | ICD-10-CM | POA: Diagnosis not present

## 2018-09-25 DIAGNOSIS — R0989 Other specified symptoms and signs involving the circulatory and respiratory systems: Secondary | ICD-10-CM | POA: Diagnosis not present

## 2018-09-26 DIAGNOSIS — I2510 Atherosclerotic heart disease of native coronary artery without angina pectoris: Secondary | ICD-10-CM | POA: Diagnosis not present

## 2018-09-26 DIAGNOSIS — R079 Chest pain, unspecified: Secondary | ICD-10-CM | POA: Diagnosis not present

## 2018-09-26 DIAGNOSIS — I214 Non-ST elevation (NSTEMI) myocardial infarction: Secondary | ICD-10-CM | POA: Diagnosis not present

## 2018-09-26 DIAGNOSIS — I509 Heart failure, unspecified: Secondary | ICD-10-CM | POA: Diagnosis not present

## 2018-09-27 DIAGNOSIS — Z79899 Other long term (current) drug therapy: Secondary | ICD-10-CM | POA: Diagnosis not present

## 2018-09-27 DIAGNOSIS — Z7902 Long term (current) use of antithrombotics/antiplatelets: Secondary | ICD-10-CM | POA: Diagnosis not present

## 2018-09-27 DIAGNOSIS — F329 Major depressive disorder, single episode, unspecified: Secondary | ICD-10-CM | POA: Diagnosis present

## 2018-09-27 DIAGNOSIS — H409 Unspecified glaucoma: Secondary | ICD-10-CM | POA: Diagnosis present

## 2018-09-27 DIAGNOSIS — I509 Heart failure, unspecified: Secondary | ICD-10-CM | POA: Diagnosis present

## 2018-09-27 DIAGNOSIS — Z955 Presence of coronary angioplasty implant and graft: Secondary | ICD-10-CM | POA: Diagnosis not present

## 2018-09-27 DIAGNOSIS — N183 Chronic kidney disease, stage 3 (moderate): Secondary | ICD-10-CM | POA: Diagnosis present

## 2018-09-27 DIAGNOSIS — G4733 Obstructive sleep apnea (adult) (pediatric): Secondary | ICD-10-CM | POA: Diagnosis present

## 2018-09-27 DIAGNOSIS — M5136 Other intervertebral disc degeneration, lumbar region: Secondary | ICD-10-CM | POA: Diagnosis present

## 2018-09-27 DIAGNOSIS — I2510 Atherosclerotic heart disease of native coronary artery without angina pectoris: Secondary | ICD-10-CM | POA: Diagnosis present

## 2018-09-27 DIAGNOSIS — I214 Non-ST elevation (NSTEMI) myocardial infarction: Secondary | ICD-10-CM | POA: Diagnosis present

## 2018-09-27 DIAGNOSIS — I081 Rheumatic disorders of both mitral and tricuspid valves: Secondary | ICD-10-CM | POA: Diagnosis present

## 2018-09-27 DIAGNOSIS — Z87891 Personal history of nicotine dependence: Secondary | ICD-10-CM | POA: Diagnosis not present

## 2018-09-27 DIAGNOSIS — I252 Old myocardial infarction: Secondary | ICD-10-CM | POA: Diagnosis not present

## 2018-09-27 DIAGNOSIS — Z5320 Procedure and treatment not carried out because of patient's decision for unspecified reasons: Secondary | ICD-10-CM | POA: Diagnosis not present

## 2018-09-27 DIAGNOSIS — R079 Chest pain, unspecified: Secondary | ICD-10-CM | POA: Diagnosis not present

## 2018-09-27 DIAGNOSIS — I130 Hypertensive heart and chronic kidney disease with heart failure and stage 1 through stage 4 chronic kidney disease, or unspecified chronic kidney disease: Secondary | ICD-10-CM | POA: Diagnosis present

## 2018-09-27 DIAGNOSIS — M109 Gout, unspecified: Secondary | ICD-10-CM | POA: Diagnosis present

## 2018-09-27 DIAGNOSIS — Z888 Allergy status to other drugs, medicaments and biological substances status: Secondary | ICD-10-CM | POA: Diagnosis not present

## 2018-09-27 DIAGNOSIS — Z951 Presence of aortocoronary bypass graft: Secondary | ICD-10-CM | POA: Diagnosis not present

## 2018-09-27 DIAGNOSIS — Z7982 Long term (current) use of aspirin: Secondary | ICD-10-CM | POA: Diagnosis not present

## 2018-09-27 DIAGNOSIS — E7800 Pure hypercholesterolemia, unspecified: Secondary | ICD-10-CM | POA: Diagnosis present

## 2018-09-27 DIAGNOSIS — I272 Pulmonary hypertension, unspecified: Secondary | ICD-10-CM | POA: Diagnosis present

## 2018-09-29 DIAGNOSIS — I5032 Chronic diastolic (congestive) heart failure: Secondary | ICD-10-CM | POA: Diagnosis not present

## 2018-09-29 DIAGNOSIS — Z951 Presence of aortocoronary bypass graft: Secondary | ICD-10-CM | POA: Diagnosis not present

## 2018-09-29 DIAGNOSIS — I252 Old myocardial infarction: Secondary | ICD-10-CM | POA: Diagnosis not present

## 2018-09-29 DIAGNOSIS — I2510 Atherosclerotic heart disease of native coronary artery without angina pectoris: Secondary | ICD-10-CM | POA: Diagnosis not present

## 2018-10-01 DIAGNOSIS — N184 Chronic kidney disease, stage 4 (severe): Secondary | ICD-10-CM | POA: Diagnosis not present

## 2018-10-04 DIAGNOSIS — I214 Non-ST elevation (NSTEMI) myocardial infarction: Secondary | ICD-10-CM | POA: Diagnosis not present

## 2018-10-04 DIAGNOSIS — I10 Essential (primary) hypertension: Secondary | ICD-10-CM | POA: Diagnosis not present

## 2018-10-04 DIAGNOSIS — I2510 Atherosclerotic heart disease of native coronary artery without angina pectoris: Secondary | ICD-10-CM | POA: Diagnosis not present

## 2018-10-04 DIAGNOSIS — Z6841 Body Mass Index (BMI) 40.0 and over, adult: Secondary | ICD-10-CM | POA: Diagnosis not present

## 2018-10-04 DIAGNOSIS — M5136 Other intervertebral disc degeneration, lumbar region: Secondary | ICD-10-CM | POA: Diagnosis not present

## 2018-10-04 DIAGNOSIS — N189 Chronic kidney disease, unspecified: Secondary | ICD-10-CM | POA: Diagnosis not present

## 2018-10-06 DIAGNOSIS — H34211 Partial retinal artery occlusion, right eye: Secondary | ICD-10-CM | POA: Diagnosis not present

## 2018-10-06 DIAGNOSIS — H353221 Exudative age-related macular degeneration, left eye, with active choroidal neovascularization: Secondary | ICD-10-CM | POA: Diagnosis not present

## 2018-10-07 DIAGNOSIS — R0602 Shortness of breath: Secondary | ICD-10-CM | POA: Diagnosis not present

## 2018-10-07 DIAGNOSIS — I25810 Atherosclerosis of coronary artery bypass graft(s) without angina pectoris: Secondary | ICD-10-CM | POA: Diagnosis not present

## 2018-10-07 DIAGNOSIS — R7989 Other specified abnormal findings of blood chemistry: Secondary | ICD-10-CM | POA: Diagnosis not present

## 2018-10-07 DIAGNOSIS — I10 Essential (primary) hypertension: Secondary | ICD-10-CM | POA: Diagnosis not present

## 2018-10-07 DIAGNOSIS — M10071 Idiopathic gout, right ankle and foot: Secondary | ICD-10-CM | POA: Diagnosis not present

## 2018-10-07 DIAGNOSIS — N184 Chronic kidney disease, stage 4 (severe): Secondary | ICD-10-CM | POA: Diagnosis not present

## 2018-10-08 DIAGNOSIS — I255 Ischemic cardiomyopathy: Secondary | ICD-10-CM | POA: Diagnosis not present

## 2018-10-08 DIAGNOSIS — I252 Old myocardial infarction: Secondary | ICD-10-CM | POA: Diagnosis not present

## 2018-10-08 DIAGNOSIS — I25118 Atherosclerotic heart disease of native coronary artery with other forms of angina pectoris: Secondary | ICD-10-CM | POA: Diagnosis not present

## 2018-10-12 DIAGNOSIS — N186 End stage renal disease: Secondary | ICD-10-CM | POA: Diagnosis not present

## 2018-10-12 DIAGNOSIS — Z6841 Body Mass Index (BMI) 40.0 and over, adult: Secondary | ICD-10-CM | POA: Diagnosis not present

## 2018-10-12 DIAGNOSIS — I25718 Atherosclerosis of autologous vein coronary artery bypass graft(s) with other forms of angina pectoris: Secondary | ICD-10-CM | POA: Diagnosis not present

## 2018-10-12 DIAGNOSIS — Z03818 Encounter for observation for suspected exposure to other biological agents ruled out: Secondary | ICD-10-CM | POA: Diagnosis not present

## 2018-10-12 DIAGNOSIS — I509 Heart failure, unspecified: Secondary | ICD-10-CM | POA: Diagnosis not present

## 2018-10-12 DIAGNOSIS — N189 Chronic kidney disease, unspecified: Secondary | ICD-10-CM | POA: Diagnosis not present

## 2018-10-12 DIAGNOSIS — I5033 Acute on chronic diastolic (congestive) heart failure: Secondary | ICD-10-CM | POA: Diagnosis not present

## 2018-10-12 DIAGNOSIS — R0602 Shortness of breath: Secondary | ICD-10-CM | POA: Diagnosis not present

## 2018-10-12 DIAGNOSIS — I214 Non-ST elevation (NSTEMI) myocardial infarction: Secondary | ICD-10-CM | POA: Diagnosis not present

## 2018-10-12 DIAGNOSIS — R0609 Other forms of dyspnea: Secondary | ICD-10-CM | POA: Diagnosis not present

## 2018-10-12 DIAGNOSIS — I132 Hypertensive heart and chronic kidney disease with heart failure and with stage 5 chronic kidney disease, or end stage renal disease: Secondary | ICD-10-CM | POA: Diagnosis not present

## 2018-10-13 DIAGNOSIS — I25810 Atherosclerosis of coronary artery bypass graft(s) without angina pectoris: Secondary | ICD-10-CM | POA: Diagnosis not present

## 2018-10-13 DIAGNOSIS — I10 Essential (primary) hypertension: Secondary | ICD-10-CM | POA: Diagnosis not present

## 2018-10-13 DIAGNOSIS — Z955 Presence of coronary angioplasty implant and graft: Secondary | ICD-10-CM | POA: Diagnosis not present

## 2018-10-13 DIAGNOSIS — Z7902 Long term (current) use of antithrombotics/antiplatelets: Secondary | ICD-10-CM | POA: Diagnosis not present

## 2018-10-13 DIAGNOSIS — I214 Non-ST elevation (NSTEMI) myocardial infarction: Secondary | ICD-10-CM | POA: Diagnosis present

## 2018-10-13 DIAGNOSIS — E876 Hypokalemia: Secondary | ICD-10-CM | POA: Diagnosis not present

## 2018-10-13 DIAGNOSIS — N179 Acute kidney failure, unspecified: Secondary | ICD-10-CM | POA: Diagnosis present

## 2018-10-13 DIAGNOSIS — I480 Paroxysmal atrial fibrillation: Secondary | ICD-10-CM | POA: Diagnosis present

## 2018-10-13 DIAGNOSIS — I209 Angina pectoris, unspecified: Secondary | ICD-10-CM | POA: Diagnosis not present

## 2018-10-13 DIAGNOSIS — D539 Nutritional anemia, unspecified: Secondary | ICD-10-CM | POA: Diagnosis present

## 2018-10-13 DIAGNOSIS — I132 Hypertensive heart and chronic kidney disease with heart failure and with stage 5 chronic kidney disease, or end stage renal disease: Secondary | ICD-10-CM | POA: Diagnosis present

## 2018-10-13 DIAGNOSIS — N184 Chronic kidney disease, stage 4 (severe): Secondary | ICD-10-CM | POA: Diagnosis not present

## 2018-10-13 DIAGNOSIS — I252 Old myocardial infarction: Secondary | ICD-10-CM | POA: Diagnosis not present

## 2018-10-13 DIAGNOSIS — E7800 Pure hypercholesterolemia, unspecified: Secondary | ICD-10-CM | POA: Diagnosis present

## 2018-10-13 DIAGNOSIS — Z951 Presence of aortocoronary bypass graft: Secondary | ICD-10-CM | POA: Diagnosis not present

## 2018-10-13 DIAGNOSIS — R0602 Shortness of breath: Secondary | ICD-10-CM | POA: Diagnosis not present

## 2018-10-13 DIAGNOSIS — E669 Obesity, unspecified: Secondary | ICD-10-CM | POA: Diagnosis present

## 2018-10-13 DIAGNOSIS — I25110 Atherosclerotic heart disease of native coronary artery with unstable angina pectoris: Secondary | ICD-10-CM | POA: Diagnosis not present

## 2018-10-13 DIAGNOSIS — Z6841 Body Mass Index (BMI) 40.0 and over, adult: Secondary | ICD-10-CM | POA: Diagnosis not present

## 2018-10-13 DIAGNOSIS — L409 Psoriasis, unspecified: Secondary | ICD-10-CM | POA: Diagnosis present

## 2018-10-13 DIAGNOSIS — Z7982 Long term (current) use of aspirin: Secondary | ICD-10-CM | POA: Diagnosis not present

## 2018-10-13 DIAGNOSIS — I5033 Acute on chronic diastolic (congestive) heart failure: Secondary | ICD-10-CM | POA: Diagnosis present

## 2018-10-13 DIAGNOSIS — M109 Gout, unspecified: Secondary | ICD-10-CM | POA: Diagnosis present

## 2018-10-13 DIAGNOSIS — J449 Chronic obstructive pulmonary disease, unspecified: Secondary | ICD-10-CM | POA: Diagnosis present

## 2018-10-13 DIAGNOSIS — I5023 Acute on chronic systolic (congestive) heart failure: Secondary | ICD-10-CM | POA: Diagnosis not present

## 2018-10-13 DIAGNOSIS — I25118 Atherosclerotic heart disease of native coronary artery with other forms of angina pectoris: Secondary | ICD-10-CM | POA: Diagnosis present

## 2018-10-13 DIAGNOSIS — Z992 Dependence on renal dialysis: Secondary | ICD-10-CM | POA: Diagnosis not present

## 2018-10-13 DIAGNOSIS — N19 Unspecified kidney failure: Secondary | ICD-10-CM | POA: Diagnosis not present

## 2018-10-13 DIAGNOSIS — G4733 Obstructive sleep apnea (adult) (pediatric): Secondary | ICD-10-CM | POA: Diagnosis present

## 2018-10-13 DIAGNOSIS — I255 Ischemic cardiomyopathy: Secondary | ICD-10-CM | POA: Diagnosis present

## 2018-10-13 DIAGNOSIS — I25718 Atherosclerosis of autologous vein coronary artery bypass graft(s) with other forms of angina pectoris: Secondary | ICD-10-CM | POA: Diagnosis present

## 2018-10-13 DIAGNOSIS — Z20828 Contact with and (suspected) exposure to other viral communicable diseases: Secondary | ICD-10-CM | POA: Diagnosis present

## 2018-10-13 DIAGNOSIS — I2510 Atherosclerotic heart disease of native coronary artery without angina pectoris: Secondary | ICD-10-CM | POA: Diagnosis not present

## 2018-10-13 DIAGNOSIS — N186 End stage renal disease: Secondary | ICD-10-CM | POA: Diagnosis present

## 2018-10-13 DIAGNOSIS — E785 Hyperlipidemia, unspecified: Secondary | ICD-10-CM | POA: Diagnosis not present

## 2018-10-21 DIAGNOSIS — Z992 Dependence on renal dialysis: Secondary | ICD-10-CM | POA: Diagnosis not present

## 2018-10-21 DIAGNOSIS — N186 End stage renal disease: Secondary | ICD-10-CM | POA: Diagnosis not present

## 2018-10-21 DIAGNOSIS — D509 Iron deficiency anemia, unspecified: Secondary | ICD-10-CM | POA: Diagnosis not present

## 2018-10-23 DIAGNOSIS — N186 End stage renal disease: Secondary | ICD-10-CM | POA: Diagnosis not present

## 2018-10-23 DIAGNOSIS — D509 Iron deficiency anemia, unspecified: Secondary | ICD-10-CM | POA: Diagnosis not present

## 2018-10-23 DIAGNOSIS — Z992 Dependence on renal dialysis: Secondary | ICD-10-CM | POA: Diagnosis not present

## 2018-10-26 DIAGNOSIS — D509 Iron deficiency anemia, unspecified: Secondary | ICD-10-CM | POA: Diagnosis not present

## 2018-10-26 DIAGNOSIS — N186 End stage renal disease: Secondary | ICD-10-CM | POA: Diagnosis not present

## 2018-10-26 DIAGNOSIS — Z992 Dependence on renal dialysis: Secondary | ICD-10-CM | POA: Diagnosis not present

## 2018-10-27 DIAGNOSIS — Z6839 Body mass index (BMI) 39.0-39.9, adult: Secondary | ICD-10-CM | POA: Diagnosis not present

## 2018-10-27 DIAGNOSIS — M5136 Other intervertebral disc degeneration, lumbar region: Secondary | ICD-10-CM | POA: Diagnosis not present

## 2018-10-27 DIAGNOSIS — E785 Hyperlipidemia, unspecified: Secondary | ICD-10-CM | POA: Diagnosis not present

## 2018-10-27 DIAGNOSIS — I10 Essential (primary) hypertension: Secondary | ICD-10-CM | POA: Diagnosis not present

## 2018-10-27 DIAGNOSIS — G4733 Obstructive sleep apnea (adult) (pediatric): Secondary | ICD-10-CM | POA: Diagnosis not present

## 2018-10-27 DIAGNOSIS — N189 Chronic kidney disease, unspecified: Secondary | ICD-10-CM | POA: Diagnosis not present

## 2018-10-27 DIAGNOSIS — I2510 Atherosclerotic heart disease of native coronary artery without angina pectoris: Secondary | ICD-10-CM | POA: Diagnosis not present

## 2018-10-28 DIAGNOSIS — N186 End stage renal disease: Secondary | ICD-10-CM | POA: Diagnosis not present

## 2018-10-28 DIAGNOSIS — Z992 Dependence on renal dialysis: Secondary | ICD-10-CM | POA: Diagnosis not present

## 2018-10-28 DIAGNOSIS — D509 Iron deficiency anemia, unspecified: Secondary | ICD-10-CM | POA: Diagnosis not present

## 2018-10-29 DIAGNOSIS — I6523 Occlusion and stenosis of bilateral carotid arteries: Secondary | ICD-10-CM | POA: Diagnosis not present

## 2018-10-29 DIAGNOSIS — I25111 Atherosclerotic heart disease of native coronary artery with angina pectoris with documented spasm: Secondary | ICD-10-CM | POA: Diagnosis not present

## 2018-10-29 DIAGNOSIS — I482 Chronic atrial fibrillation, unspecified: Secondary | ICD-10-CM | POA: Diagnosis not present

## 2018-10-29 DIAGNOSIS — I5022 Chronic systolic (congestive) heart failure: Secondary | ICD-10-CM | POA: Diagnosis not present

## 2018-10-30 DIAGNOSIS — Z23 Encounter for immunization: Secondary | ICD-10-CM | POA: Diagnosis not present

## 2018-10-30 DIAGNOSIS — N186 End stage renal disease: Secondary | ICD-10-CM | POA: Diagnosis not present

## 2018-10-30 DIAGNOSIS — D509 Iron deficiency anemia, unspecified: Secondary | ICD-10-CM | POA: Diagnosis not present

## 2018-10-30 DIAGNOSIS — Z992 Dependence on renal dialysis: Secondary | ICD-10-CM | POA: Diagnosis not present

## 2018-11-02 DIAGNOSIS — Z992 Dependence on renal dialysis: Secondary | ICD-10-CM | POA: Diagnosis not present

## 2018-11-02 DIAGNOSIS — N186 End stage renal disease: Secondary | ICD-10-CM | POA: Diagnosis not present

## 2018-11-02 DIAGNOSIS — D509 Iron deficiency anemia, unspecified: Secondary | ICD-10-CM | POA: Diagnosis not present

## 2018-11-04 DIAGNOSIS — Z992 Dependence on renal dialysis: Secondary | ICD-10-CM | POA: Diagnosis not present

## 2018-11-04 DIAGNOSIS — N186 End stage renal disease: Secondary | ICD-10-CM | POA: Diagnosis not present

## 2018-11-04 DIAGNOSIS — D509 Iron deficiency anemia, unspecified: Secondary | ICD-10-CM | POA: Diagnosis not present

## 2018-11-06 DIAGNOSIS — N186 End stage renal disease: Secondary | ICD-10-CM | POA: Diagnosis not present

## 2018-11-06 DIAGNOSIS — Z992 Dependence on renal dialysis: Secondary | ICD-10-CM | POA: Diagnosis not present

## 2018-11-06 DIAGNOSIS — D509 Iron deficiency anemia, unspecified: Secondary | ICD-10-CM | POA: Diagnosis not present

## 2018-11-09 DIAGNOSIS — D509 Iron deficiency anemia, unspecified: Secondary | ICD-10-CM | POA: Diagnosis not present

## 2018-11-09 DIAGNOSIS — Z992 Dependence on renal dialysis: Secondary | ICD-10-CM | POA: Diagnosis not present

## 2018-11-09 DIAGNOSIS — N186 End stage renal disease: Secondary | ICD-10-CM | POA: Diagnosis not present

## 2018-11-11 DIAGNOSIS — N186 End stage renal disease: Secondary | ICD-10-CM | POA: Diagnosis not present

## 2018-11-11 DIAGNOSIS — D509 Iron deficiency anemia, unspecified: Secondary | ICD-10-CM | POA: Diagnosis not present

## 2018-11-11 DIAGNOSIS — Z992 Dependence on renal dialysis: Secondary | ICD-10-CM | POA: Diagnosis not present

## 2018-11-12 DIAGNOSIS — I5032 Chronic diastolic (congestive) heart failure: Secondary | ICD-10-CM | POA: Diagnosis not present

## 2018-11-12 DIAGNOSIS — J449 Chronic obstructive pulmonary disease, unspecified: Secondary | ICD-10-CM | POA: Diagnosis not present

## 2018-11-12 DIAGNOSIS — N186 End stage renal disease: Secondary | ICD-10-CM | POA: Diagnosis not present

## 2018-11-12 DIAGNOSIS — Z992 Dependence on renal dialysis: Secondary | ICD-10-CM | POA: Diagnosis not present

## 2018-11-12 DIAGNOSIS — R05 Cough: Secondary | ICD-10-CM | POA: Diagnosis not present

## 2018-11-12 DIAGNOSIS — Z87891 Personal history of nicotine dependence: Secondary | ICD-10-CM | POA: Diagnosis not present

## 2018-11-13 DIAGNOSIS — N186 End stage renal disease: Secondary | ICD-10-CM | POA: Diagnosis not present

## 2018-11-13 DIAGNOSIS — Z992 Dependence on renal dialysis: Secondary | ICD-10-CM | POA: Diagnosis not present

## 2018-11-13 DIAGNOSIS — D509 Iron deficiency anemia, unspecified: Secondary | ICD-10-CM | POA: Diagnosis not present

## 2018-11-16 DIAGNOSIS — N186 End stage renal disease: Secondary | ICD-10-CM | POA: Diagnosis not present

## 2018-11-16 DIAGNOSIS — Z992 Dependence on renal dialysis: Secondary | ICD-10-CM | POA: Diagnosis not present

## 2018-11-16 DIAGNOSIS — D509 Iron deficiency anemia, unspecified: Secondary | ICD-10-CM | POA: Diagnosis not present

## 2018-11-17 DIAGNOSIS — Z992 Dependence on renal dialysis: Secondary | ICD-10-CM | POA: Diagnosis not present

## 2018-11-17 DIAGNOSIS — N186 End stage renal disease: Secondary | ICD-10-CM | POA: Diagnosis not present

## 2018-11-18 DIAGNOSIS — Z23 Encounter for immunization: Secondary | ICD-10-CM | POA: Diagnosis not present

## 2018-11-18 DIAGNOSIS — Z992 Dependence on renal dialysis: Secondary | ICD-10-CM | POA: Diagnosis not present

## 2018-11-18 DIAGNOSIS — N186 End stage renal disease: Secondary | ICD-10-CM | POA: Diagnosis not present

## 2018-11-18 DIAGNOSIS — D509 Iron deficiency anemia, unspecified: Secondary | ICD-10-CM | POA: Diagnosis not present

## 2018-11-20 DIAGNOSIS — Z23 Encounter for immunization: Secondary | ICD-10-CM | POA: Diagnosis not present

## 2018-11-20 DIAGNOSIS — N186 End stage renal disease: Secondary | ICD-10-CM | POA: Diagnosis not present

## 2018-11-20 DIAGNOSIS — Z992 Dependence on renal dialysis: Secondary | ICD-10-CM | POA: Diagnosis not present

## 2018-11-20 DIAGNOSIS — D509 Iron deficiency anemia, unspecified: Secondary | ICD-10-CM | POA: Diagnosis not present

## 2018-11-22 DIAGNOSIS — N184 Chronic kidney disease, stage 4 (severe): Secondary | ICD-10-CM | POA: Diagnosis not present

## 2018-11-22 DIAGNOSIS — N185 Chronic kidney disease, stage 5: Secondary | ICD-10-CM | POA: Diagnosis not present

## 2018-11-22 DIAGNOSIS — I6523 Occlusion and stenosis of bilateral carotid arteries: Secondary | ICD-10-CM | POA: Diagnosis not present

## 2018-11-22 DIAGNOSIS — E782 Mixed hyperlipidemia: Secondary | ICD-10-CM | POA: Diagnosis not present

## 2018-11-23 DIAGNOSIS — D509 Iron deficiency anemia, unspecified: Secondary | ICD-10-CM | POA: Diagnosis not present

## 2018-11-23 DIAGNOSIS — N186 End stage renal disease: Secondary | ICD-10-CM | POA: Diagnosis not present

## 2018-11-23 DIAGNOSIS — Z992 Dependence on renal dialysis: Secondary | ICD-10-CM | POA: Diagnosis not present

## 2018-11-23 DIAGNOSIS — Z23 Encounter for immunization: Secondary | ICD-10-CM | POA: Diagnosis not present

## 2018-11-25 DIAGNOSIS — D509 Iron deficiency anemia, unspecified: Secondary | ICD-10-CM | POA: Diagnosis not present

## 2018-11-25 DIAGNOSIS — N186 End stage renal disease: Secondary | ICD-10-CM | POA: Diagnosis not present

## 2018-11-25 DIAGNOSIS — Z992 Dependence on renal dialysis: Secondary | ICD-10-CM | POA: Diagnosis not present

## 2018-11-25 DIAGNOSIS — Z23 Encounter for immunization: Secondary | ICD-10-CM | POA: Diagnosis not present

## 2018-11-27 DIAGNOSIS — Z992 Dependence on renal dialysis: Secondary | ICD-10-CM | POA: Diagnosis not present

## 2018-11-27 DIAGNOSIS — N186 End stage renal disease: Secondary | ICD-10-CM | POA: Diagnosis not present

## 2018-11-27 DIAGNOSIS — D509 Iron deficiency anemia, unspecified: Secondary | ICD-10-CM | POA: Diagnosis not present

## 2018-11-27 DIAGNOSIS — Z23 Encounter for immunization: Secondary | ICD-10-CM | POA: Diagnosis not present

## 2018-11-30 DIAGNOSIS — Z992 Dependence on renal dialysis: Secondary | ICD-10-CM | POA: Diagnosis not present

## 2018-11-30 DIAGNOSIS — N186 End stage renal disease: Secondary | ICD-10-CM | POA: Diagnosis not present

## 2018-11-30 DIAGNOSIS — D509 Iron deficiency anemia, unspecified: Secondary | ICD-10-CM | POA: Diagnosis not present

## 2018-11-30 DIAGNOSIS — Z23 Encounter for immunization: Secondary | ICD-10-CM | POA: Diagnosis not present

## 2018-12-02 DIAGNOSIS — Z992 Dependence on renal dialysis: Secondary | ICD-10-CM | POA: Diagnosis not present

## 2018-12-02 DIAGNOSIS — Z23 Encounter for immunization: Secondary | ICD-10-CM | POA: Diagnosis not present

## 2018-12-02 DIAGNOSIS — D509 Iron deficiency anemia, unspecified: Secondary | ICD-10-CM | POA: Diagnosis not present

## 2018-12-02 DIAGNOSIS — N186 End stage renal disease: Secondary | ICD-10-CM | POA: Diagnosis not present

## 2018-12-04 DIAGNOSIS — Z23 Encounter for immunization: Secondary | ICD-10-CM | POA: Diagnosis not present

## 2018-12-04 DIAGNOSIS — N186 End stage renal disease: Secondary | ICD-10-CM | POA: Diagnosis not present

## 2018-12-04 DIAGNOSIS — D509 Iron deficiency anemia, unspecified: Secondary | ICD-10-CM | POA: Diagnosis not present

## 2018-12-04 DIAGNOSIS — Z992 Dependence on renal dialysis: Secondary | ICD-10-CM | POA: Diagnosis not present

## 2018-12-07 DIAGNOSIS — D509 Iron deficiency anemia, unspecified: Secondary | ICD-10-CM | POA: Diagnosis not present

## 2018-12-07 DIAGNOSIS — Z23 Encounter for immunization: Secondary | ICD-10-CM | POA: Diagnosis not present

## 2018-12-07 DIAGNOSIS — N186 End stage renal disease: Secondary | ICD-10-CM | POA: Diagnosis not present

## 2018-12-07 DIAGNOSIS — Z992 Dependence on renal dialysis: Secondary | ICD-10-CM | POA: Diagnosis not present

## 2018-12-09 DIAGNOSIS — Z992 Dependence on renal dialysis: Secondary | ICD-10-CM | POA: Diagnosis not present

## 2018-12-09 DIAGNOSIS — Z23 Encounter for immunization: Secondary | ICD-10-CM | POA: Diagnosis not present

## 2018-12-09 DIAGNOSIS — N186 End stage renal disease: Secondary | ICD-10-CM | POA: Diagnosis not present

## 2018-12-09 DIAGNOSIS — D509 Iron deficiency anemia, unspecified: Secondary | ICD-10-CM | POA: Diagnosis not present

## 2018-12-11 DIAGNOSIS — N186 End stage renal disease: Secondary | ICD-10-CM | POA: Diagnosis not present

## 2018-12-11 DIAGNOSIS — Z23 Encounter for immunization: Secondary | ICD-10-CM | POA: Diagnosis not present

## 2018-12-11 DIAGNOSIS — D509 Iron deficiency anemia, unspecified: Secondary | ICD-10-CM | POA: Diagnosis not present

## 2018-12-11 DIAGNOSIS — Z992 Dependence on renal dialysis: Secondary | ICD-10-CM | POA: Diagnosis not present

## 2018-12-14 DIAGNOSIS — D509 Iron deficiency anemia, unspecified: Secondary | ICD-10-CM | POA: Diagnosis not present

## 2018-12-14 DIAGNOSIS — Z992 Dependence on renal dialysis: Secondary | ICD-10-CM | POA: Diagnosis not present

## 2018-12-14 DIAGNOSIS — N186 End stage renal disease: Secondary | ICD-10-CM | POA: Diagnosis not present

## 2018-12-14 DIAGNOSIS — Z23 Encounter for immunization: Secondary | ICD-10-CM | POA: Diagnosis not present

## 2018-12-16 DIAGNOSIS — N186 End stage renal disease: Secondary | ICD-10-CM | POA: Diagnosis not present

## 2018-12-16 DIAGNOSIS — Z992 Dependence on renal dialysis: Secondary | ICD-10-CM | POA: Diagnosis not present

## 2018-12-16 DIAGNOSIS — D509 Iron deficiency anemia, unspecified: Secondary | ICD-10-CM | POA: Diagnosis not present

## 2018-12-16 DIAGNOSIS — Z23 Encounter for immunization: Secondary | ICD-10-CM | POA: Diagnosis not present

## 2018-12-17 DIAGNOSIS — Z992 Dependence on renal dialysis: Secondary | ICD-10-CM | POA: Diagnosis not present

## 2018-12-17 DIAGNOSIS — N186 End stage renal disease: Secondary | ICD-10-CM | POA: Diagnosis not present

## 2018-12-18 DIAGNOSIS — Z23 Encounter for immunization: Secondary | ICD-10-CM | POA: Diagnosis not present

## 2018-12-18 DIAGNOSIS — D509 Iron deficiency anemia, unspecified: Secondary | ICD-10-CM | POA: Diagnosis not present

## 2018-12-18 DIAGNOSIS — Z992 Dependence on renal dialysis: Secondary | ICD-10-CM | POA: Diagnosis not present

## 2018-12-18 DIAGNOSIS — N186 End stage renal disease: Secondary | ICD-10-CM | POA: Diagnosis not present

## 2018-12-21 DIAGNOSIS — Z23 Encounter for immunization: Secondary | ICD-10-CM | POA: Diagnosis not present

## 2018-12-21 DIAGNOSIS — N186 End stage renal disease: Secondary | ICD-10-CM | POA: Diagnosis not present

## 2018-12-21 DIAGNOSIS — Z992 Dependence on renal dialysis: Secondary | ICD-10-CM | POA: Diagnosis not present

## 2018-12-23 DIAGNOSIS — Z992 Dependence on renal dialysis: Secondary | ICD-10-CM | POA: Diagnosis not present

## 2018-12-23 DIAGNOSIS — Z23 Encounter for immunization: Secondary | ICD-10-CM | POA: Diagnosis not present

## 2018-12-23 DIAGNOSIS — N186 End stage renal disease: Secondary | ICD-10-CM | POA: Diagnosis not present

## 2018-12-25 DIAGNOSIS — N186 End stage renal disease: Secondary | ICD-10-CM | POA: Diagnosis not present

## 2018-12-25 DIAGNOSIS — Z992 Dependence on renal dialysis: Secondary | ICD-10-CM | POA: Diagnosis not present

## 2018-12-25 DIAGNOSIS — Z23 Encounter for immunization: Secondary | ICD-10-CM | POA: Diagnosis not present

## 2018-12-28 DIAGNOSIS — Z992 Dependence on renal dialysis: Secondary | ICD-10-CM | POA: Diagnosis not present

## 2018-12-28 DIAGNOSIS — Z23 Encounter for immunization: Secondary | ICD-10-CM | POA: Diagnosis not present

## 2018-12-28 DIAGNOSIS — N186 End stage renal disease: Secondary | ICD-10-CM | POA: Diagnosis not present

## 2018-12-30 DIAGNOSIS — Z23 Encounter for immunization: Secondary | ICD-10-CM | POA: Diagnosis not present

## 2018-12-30 DIAGNOSIS — N186 End stage renal disease: Secondary | ICD-10-CM | POA: Diagnosis not present

## 2018-12-30 DIAGNOSIS — Z992 Dependence on renal dialysis: Secondary | ICD-10-CM | POA: Diagnosis not present

## 2018-12-31 DIAGNOSIS — H353221 Exudative age-related macular degeneration, left eye, with active choroidal neovascularization: Secondary | ICD-10-CM | POA: Diagnosis not present

## 2018-12-31 DIAGNOSIS — H34211 Partial retinal artery occlusion, right eye: Secondary | ICD-10-CM | POA: Diagnosis not present

## 2019-01-01 DIAGNOSIS — Z23 Encounter for immunization: Secondary | ICD-10-CM | POA: Diagnosis not present

## 2019-01-01 DIAGNOSIS — N186 End stage renal disease: Secondary | ICD-10-CM | POA: Diagnosis not present

## 2019-01-01 DIAGNOSIS — Z992 Dependence on renal dialysis: Secondary | ICD-10-CM | POA: Diagnosis not present

## 2019-01-04 DIAGNOSIS — Z23 Encounter for immunization: Secondary | ICD-10-CM | POA: Diagnosis not present

## 2019-01-04 DIAGNOSIS — Z992 Dependence on renal dialysis: Secondary | ICD-10-CM | POA: Diagnosis not present

## 2019-01-04 DIAGNOSIS — N186 End stage renal disease: Secondary | ICD-10-CM | POA: Diagnosis not present

## 2019-01-06 DIAGNOSIS — Z23 Encounter for immunization: Secondary | ICD-10-CM | POA: Diagnosis not present

## 2019-01-06 DIAGNOSIS — N186 End stage renal disease: Secondary | ICD-10-CM | POA: Diagnosis not present

## 2019-01-06 DIAGNOSIS — Z992 Dependence on renal dialysis: Secondary | ICD-10-CM | POA: Diagnosis not present

## 2019-01-08 DIAGNOSIS — Z23 Encounter for immunization: Secondary | ICD-10-CM | POA: Diagnosis not present

## 2019-01-08 DIAGNOSIS — N186 End stage renal disease: Secondary | ICD-10-CM | POA: Diagnosis not present

## 2019-01-08 DIAGNOSIS — Z992 Dependence on renal dialysis: Secondary | ICD-10-CM | POA: Diagnosis not present

## 2019-01-10 DIAGNOSIS — N186 End stage renal disease: Secondary | ICD-10-CM | POA: Diagnosis not present

## 2019-01-10 DIAGNOSIS — Z23 Encounter for immunization: Secondary | ICD-10-CM | POA: Diagnosis not present

## 2019-01-10 DIAGNOSIS — Z992 Dependence on renal dialysis: Secondary | ICD-10-CM | POA: Diagnosis not present

## 2019-01-12 DIAGNOSIS — N186 End stage renal disease: Secondary | ICD-10-CM | POA: Diagnosis not present

## 2019-01-12 DIAGNOSIS — Z992 Dependence on renal dialysis: Secondary | ICD-10-CM | POA: Diagnosis not present

## 2019-01-12 DIAGNOSIS — Z23 Encounter for immunization: Secondary | ICD-10-CM | POA: Diagnosis not present

## 2019-01-15 DIAGNOSIS — Z23 Encounter for immunization: Secondary | ICD-10-CM | POA: Diagnosis not present

## 2019-01-15 DIAGNOSIS — Z992 Dependence on renal dialysis: Secondary | ICD-10-CM | POA: Diagnosis not present

## 2019-01-15 DIAGNOSIS — N186 End stage renal disease: Secondary | ICD-10-CM | POA: Diagnosis not present

## 2019-01-26 DIAGNOSIS — M5136 Other intervertebral disc degeneration, lumbar region: Secondary | ICD-10-CM | POA: Diagnosis not present

## 2019-01-26 DIAGNOSIS — I10 Essential (primary) hypertension: Secondary | ICD-10-CM | POA: Diagnosis not present

## 2019-01-26 DIAGNOSIS — Z6839 Body mass index (BMI) 39.0-39.9, adult: Secondary | ICD-10-CM | POA: Diagnosis not present

## 2019-01-26 DIAGNOSIS — I2510 Atherosclerotic heart disease of native coronary artery without angina pectoris: Secondary | ICD-10-CM | POA: Diagnosis not present

## 2019-01-26 DIAGNOSIS — N189 Chronic kidney disease, unspecified: Secondary | ICD-10-CM | POA: Diagnosis not present

## 2019-08-27 IMAGING — US US RENAL
1 series · 14 of 25 positions shown · non-contrast
Comparison: None.

CLINICAL DATA: Chronic renal failure.

EXAM:
RENAL / URINARY TRACT ULTRASOUND COMPLETE

[Series 1: us renal · 0.30mm/px · 14 of 67 slices shown]
[im 1/67]
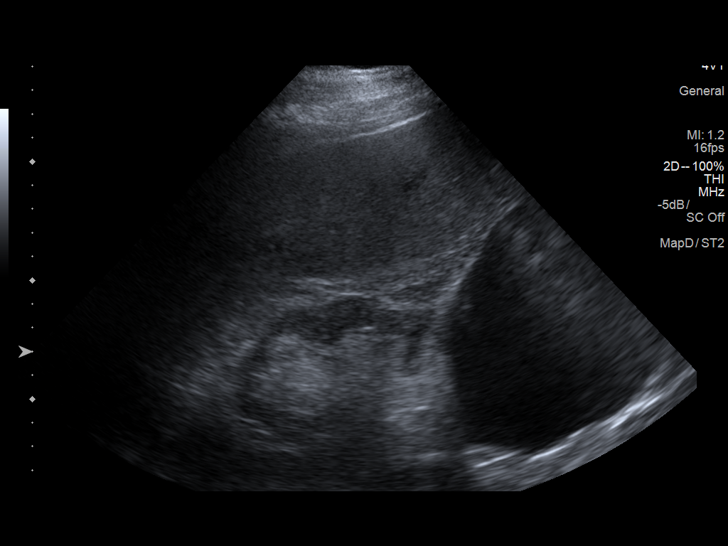
[im 6/67]
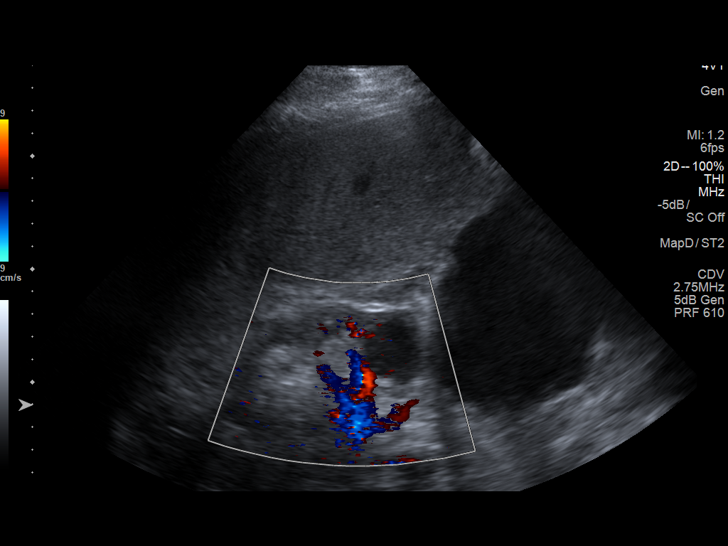
[im 12/67]
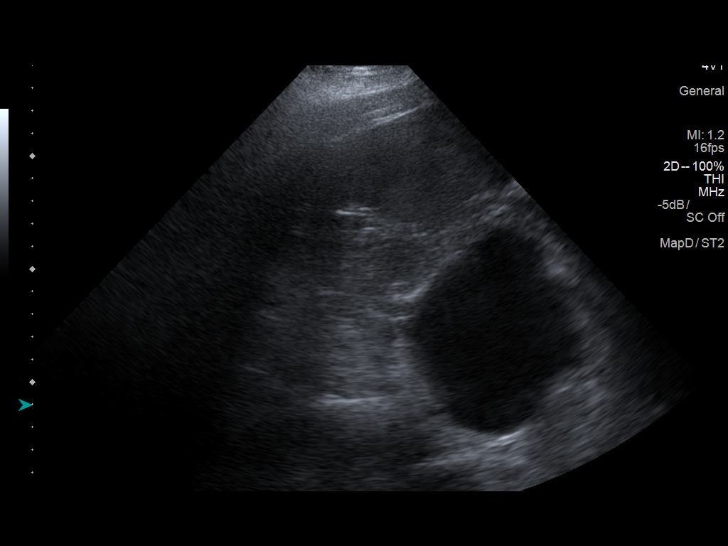
[im 17/67]
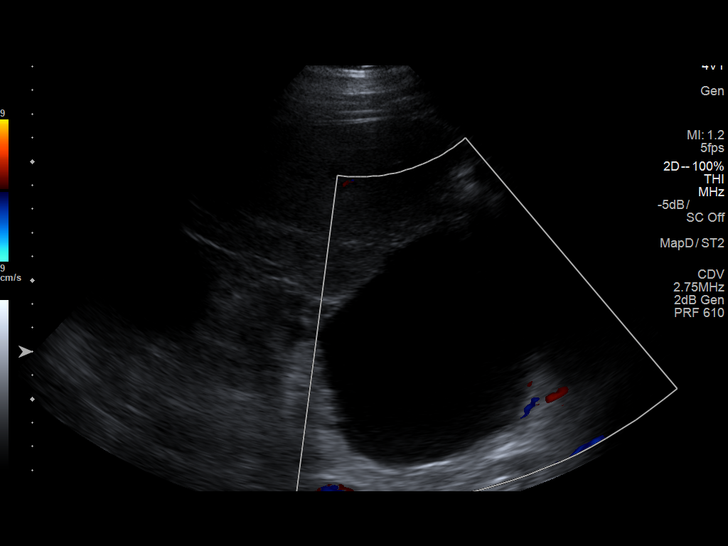
[im 23/67]
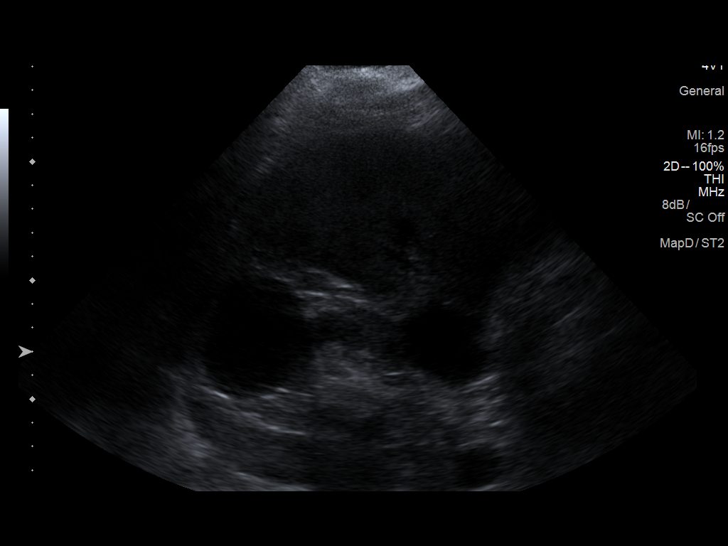
[im 25/67]
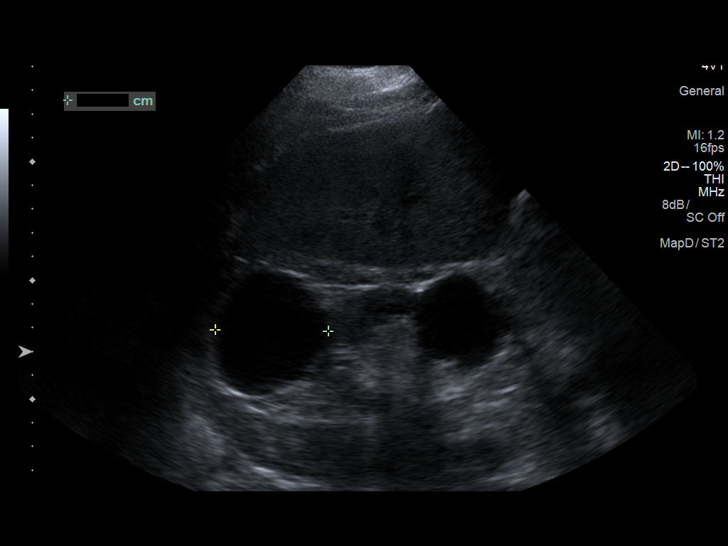
[im 31/67]
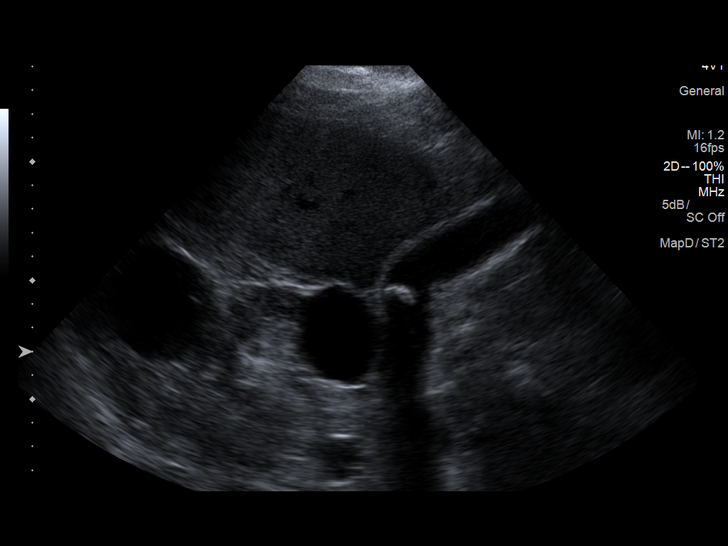
[im 36/67]
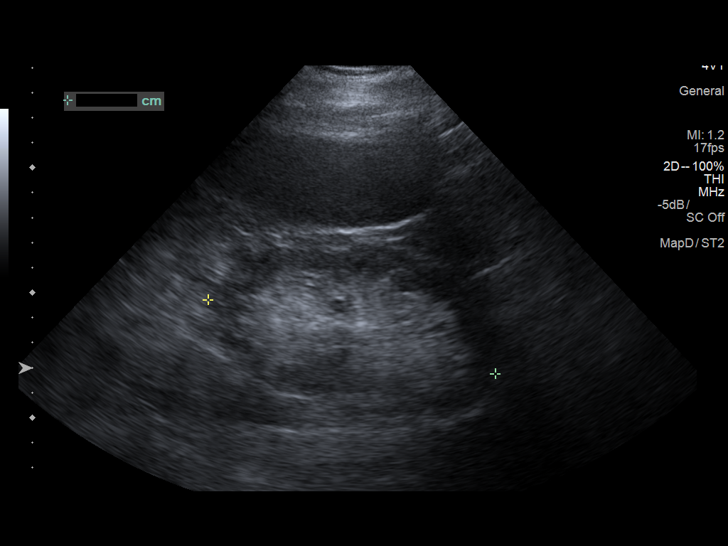
[im 42/67]
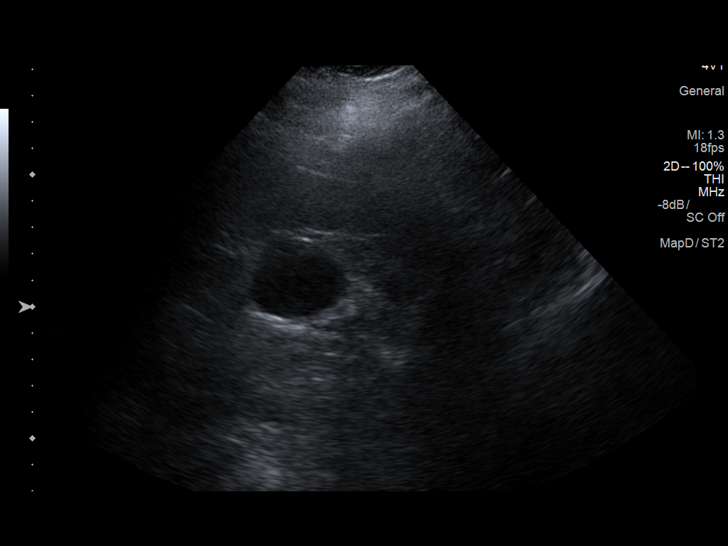
[im 45/67]
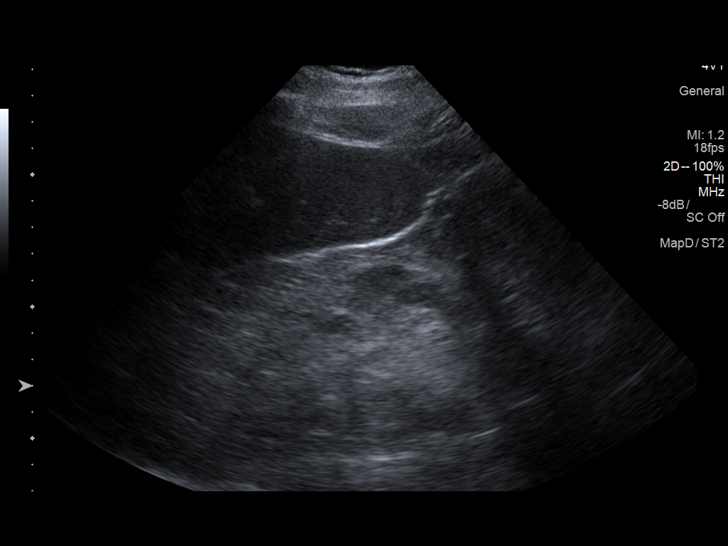
[im 50/67]
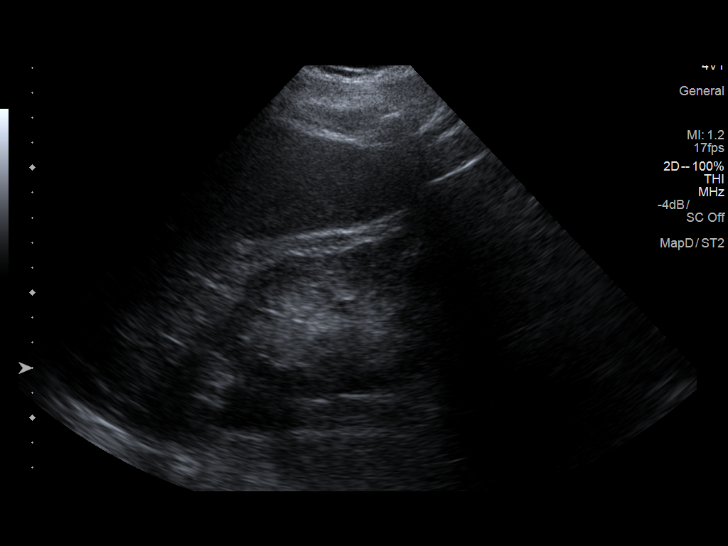
[im 56/67]
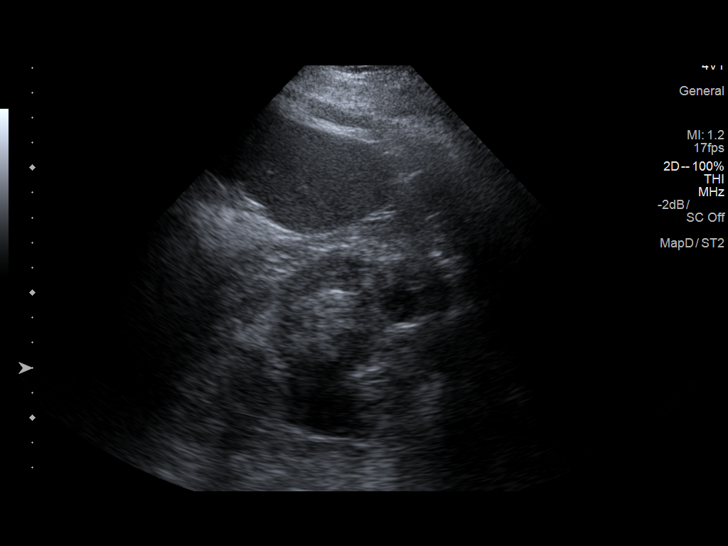
[im 61/67]
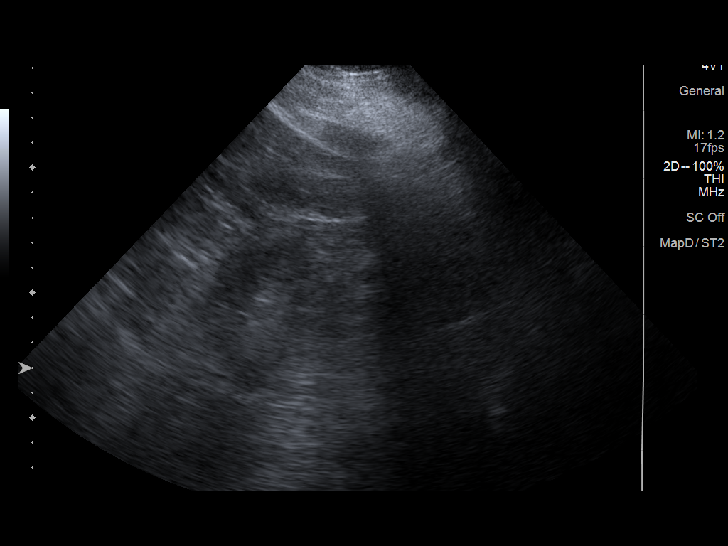
[im 67/67]
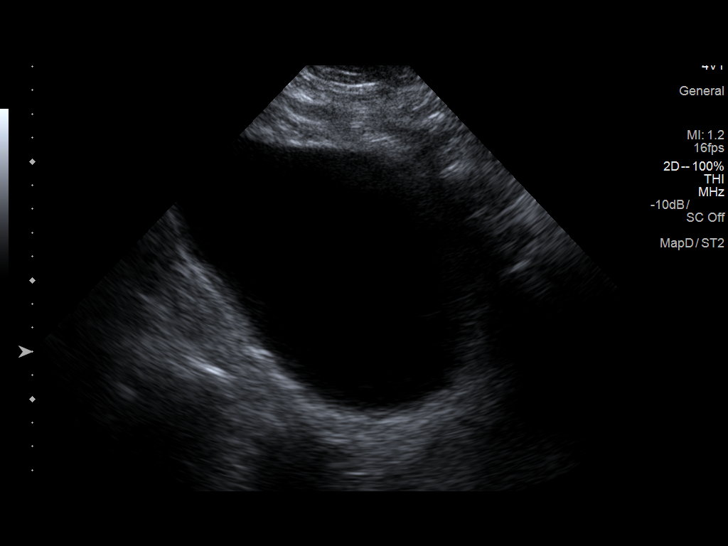

[14 of 25 positions shown; findings below may reference images not displayed]

FINDINGS: Right Kidney:

Length: 9.3 cm. Two simple cysts are noted, with the largest
measuring 10.8 cm. Echogenicity within normal limits. No mass or
hydronephrosis visualized.

Left Kidney:

Length: 11.9 cm. Two simple cysts are noted, with the largest
measuring 3.6 cm. Echogenicity within normal limits. No mass or
hydronephrosis visualized.

Bladder:

Appears normal for degree of bladder distention.
IMPRESSION: Bilateral simple renal cysts. No other significant renal abnormality
seen.

## 2019-12-08 IMAGING — DX DG CHEST 1V PORT
2 series · 2 of 2 positions shown · non-contrast
Comparison: Prior radiograph from earlier the same day.

CLINICAL DATA: Initial evaluation for acute respiratory distress,
chest pain.

EXAM:
PORTABLE CHEST 1 VIEW

[chest ap (1 of 2)]
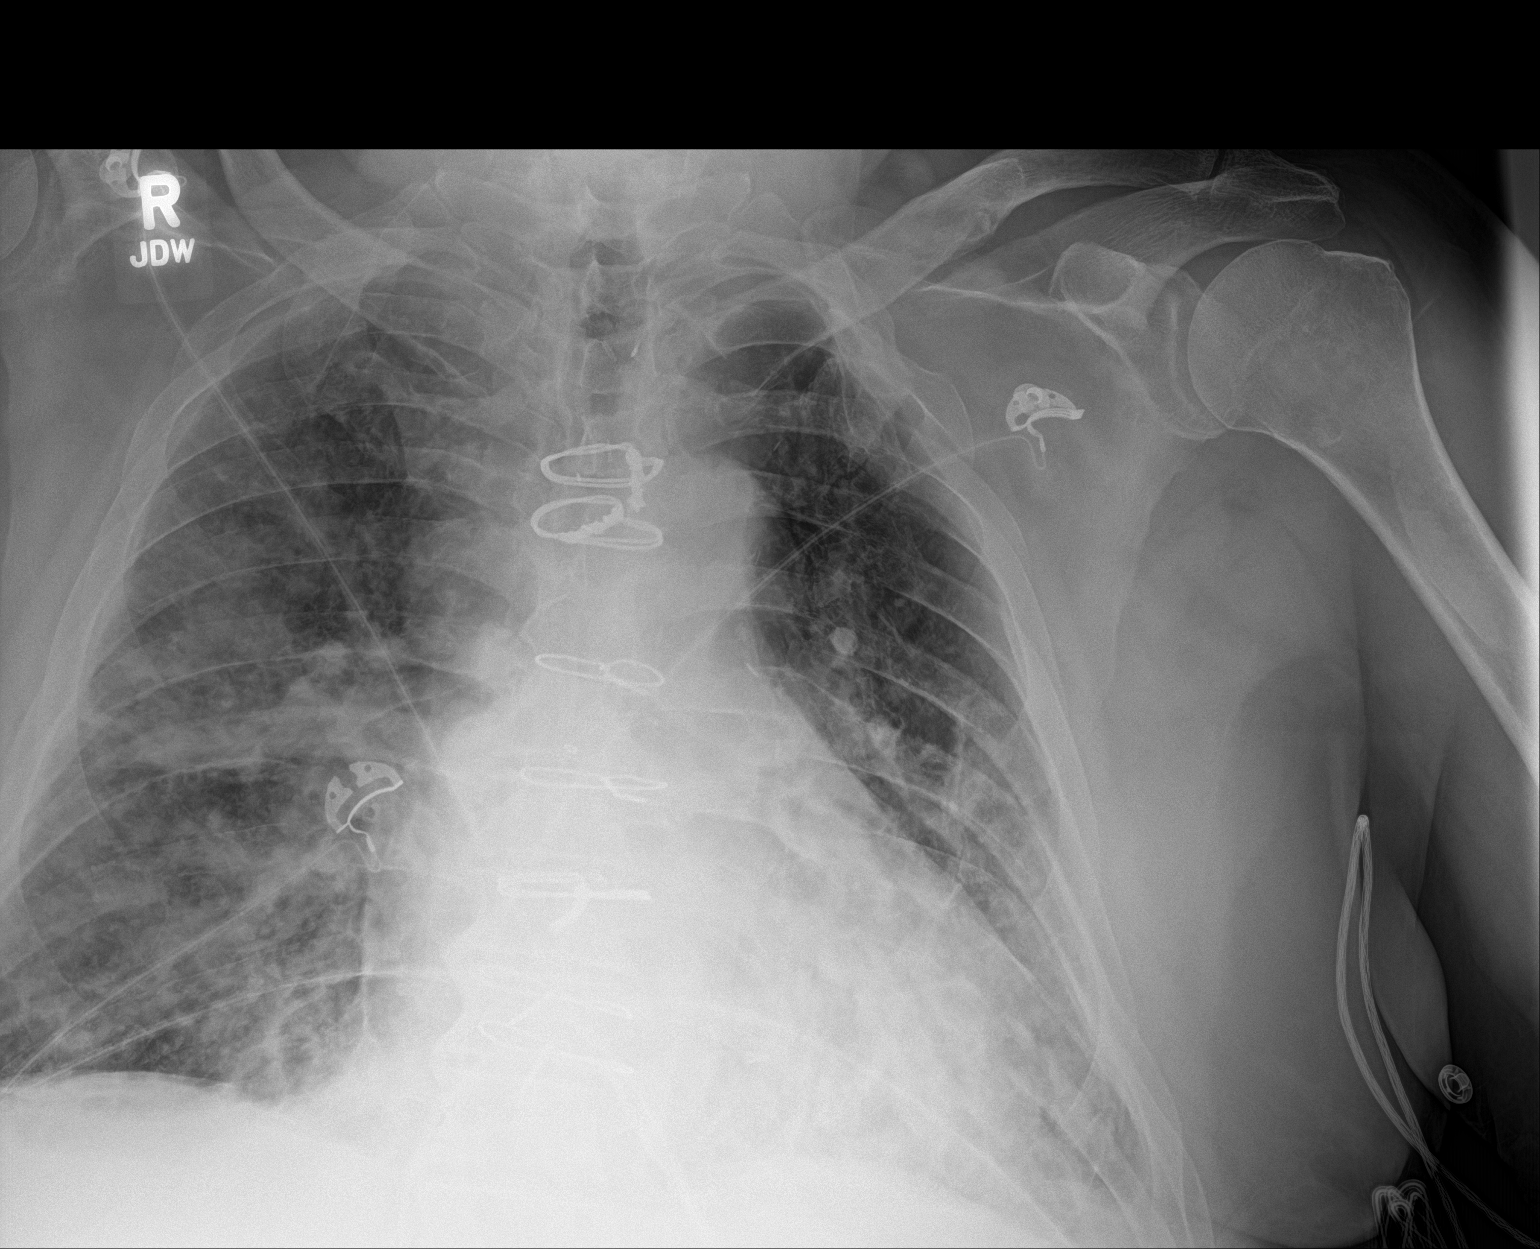

[chest ap (2 of 2)]
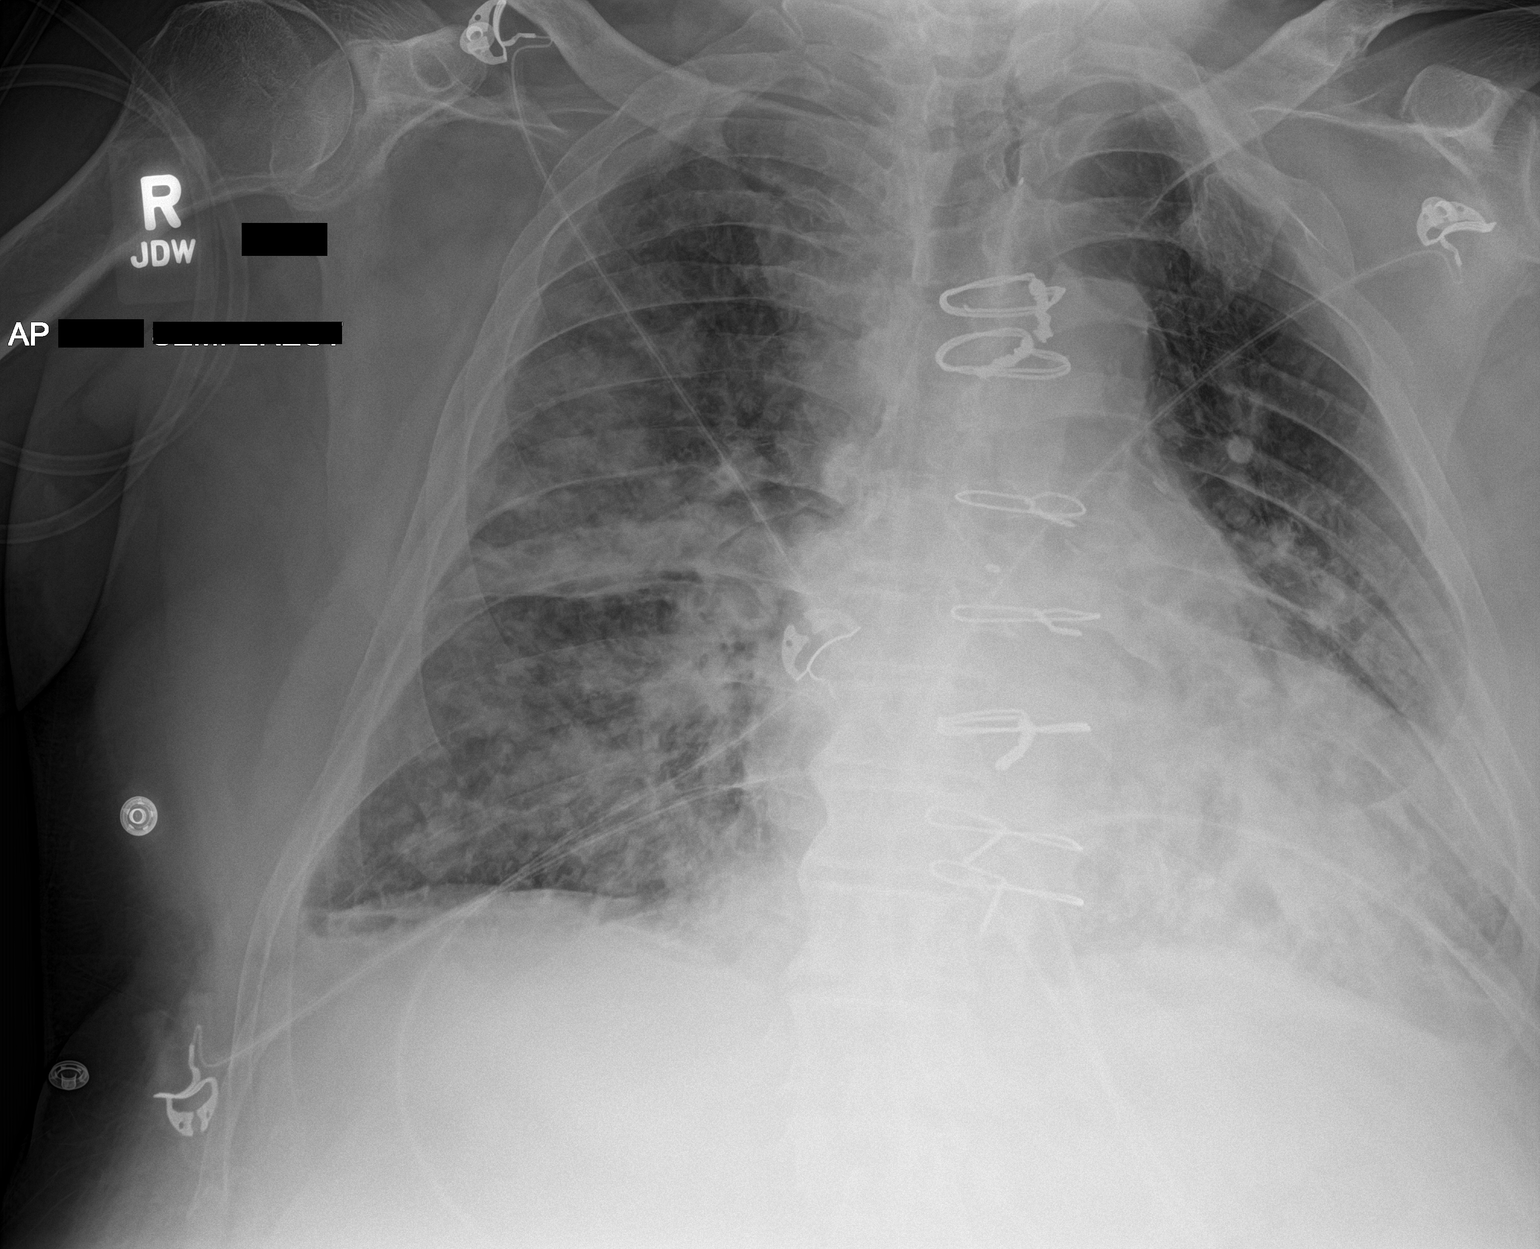

[2 of 2 positions shown; findings below may reference images not displayed]

FINDINGS: Median sternotomy wires underlying CABG markers again noted. Stable
cardiomegaly. Mediastinal silhouette normal.

Lungs normally inflated. Slightly worsened diffuse pulmonary
vascular congestion with interstitial prominence, consistent with
pulmonary edema. New parenchymal infiltrates within the right upper
and bilateral lower lobes, which may reflect edema and/or possibly
infiltrates. Small right pleural effusion. No pneumothorax.

Osseous structures are unchanged.
IMPRESSION: 1. Stable cardiomegaly with interval worsening in diffuse vascular
and interstitial prominence, consistent with pulmonary edema.
2. New more confluent patchy right upper and bibasilar opacities,
which may reflect edema and/or infiltrates.
3. Small right pleural effusion.

## 2019-12-08 IMAGING — DX DG CHEST 1V PORT
1 series · 1 of 1 positions shown · non-contrast
Comparison: May 05, 2017 pain May 01, 2017

CLINICAL DATA: Coronary artery disease, status post coronary artery
bypass grafting

EXAM:
PORTABLE CHEST 1 VIEW

[chest ap]
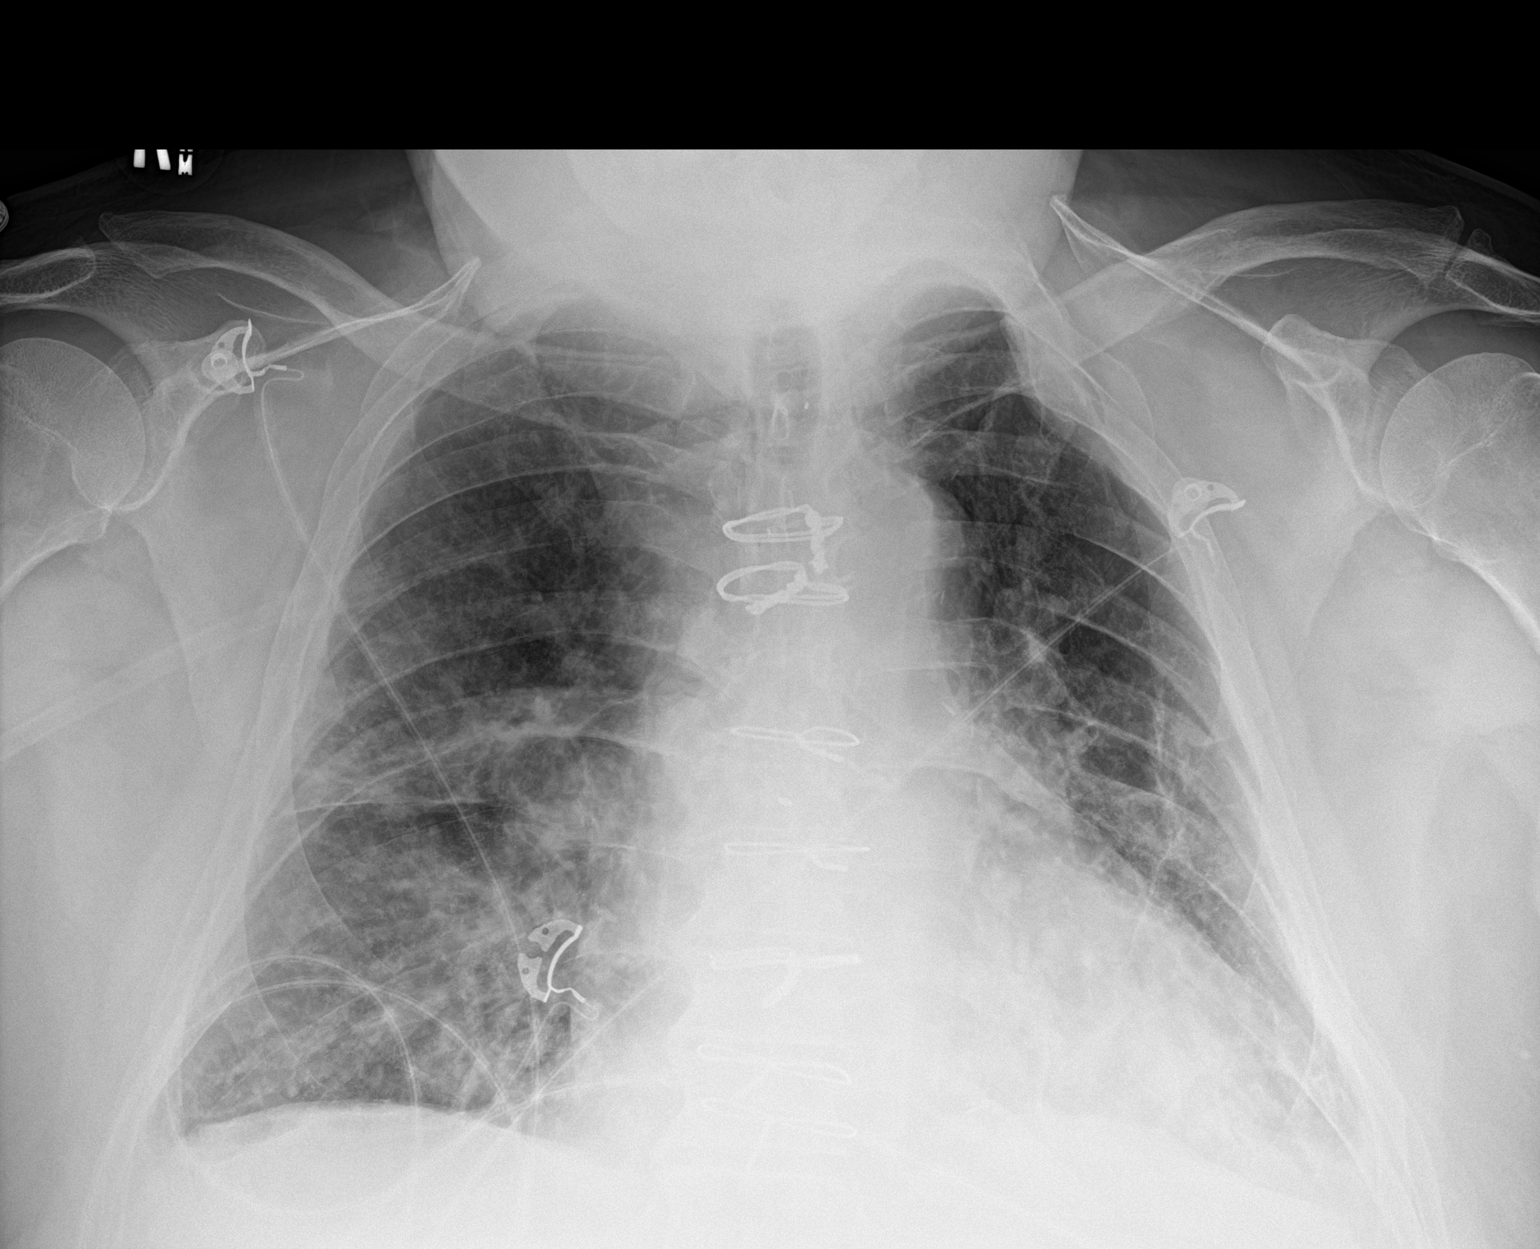

[1 of 1 positions shown; findings below may reference images not displayed]

FINDINGS: There are areas of scarring and fibrotic change bilaterally. Mild
interstitial pulmonary edema in the mid lower lung zones is stable.
There is a small pleural effusion on each side. There is no new
opacity. There is no airspace consolidation. There is cardiomegaly.
The pulmonary vascularity is normal. Patient is status post coronary
artery bypass grafting. There is evidence of old rib trauma on the
left superiorly.
IMPRESSION: Mild interstitial edema, stable. Small pleural effusions
bilaterally. Underlying areas of fibrosis and scarring. Stable
cardiomegaly. No appreciable change compared to 1 day prior.

## 2019-12-11 IMAGING — DX DG CHEST 1V PORT
1 series · 1 of 1 positions shown · non-contrast
Comparison: Chest x-ray from yesterday.

CLINICAL DATA: Shortness of breath.

EXAM:
PORTABLE CHEST 1 VIEW

[chest]
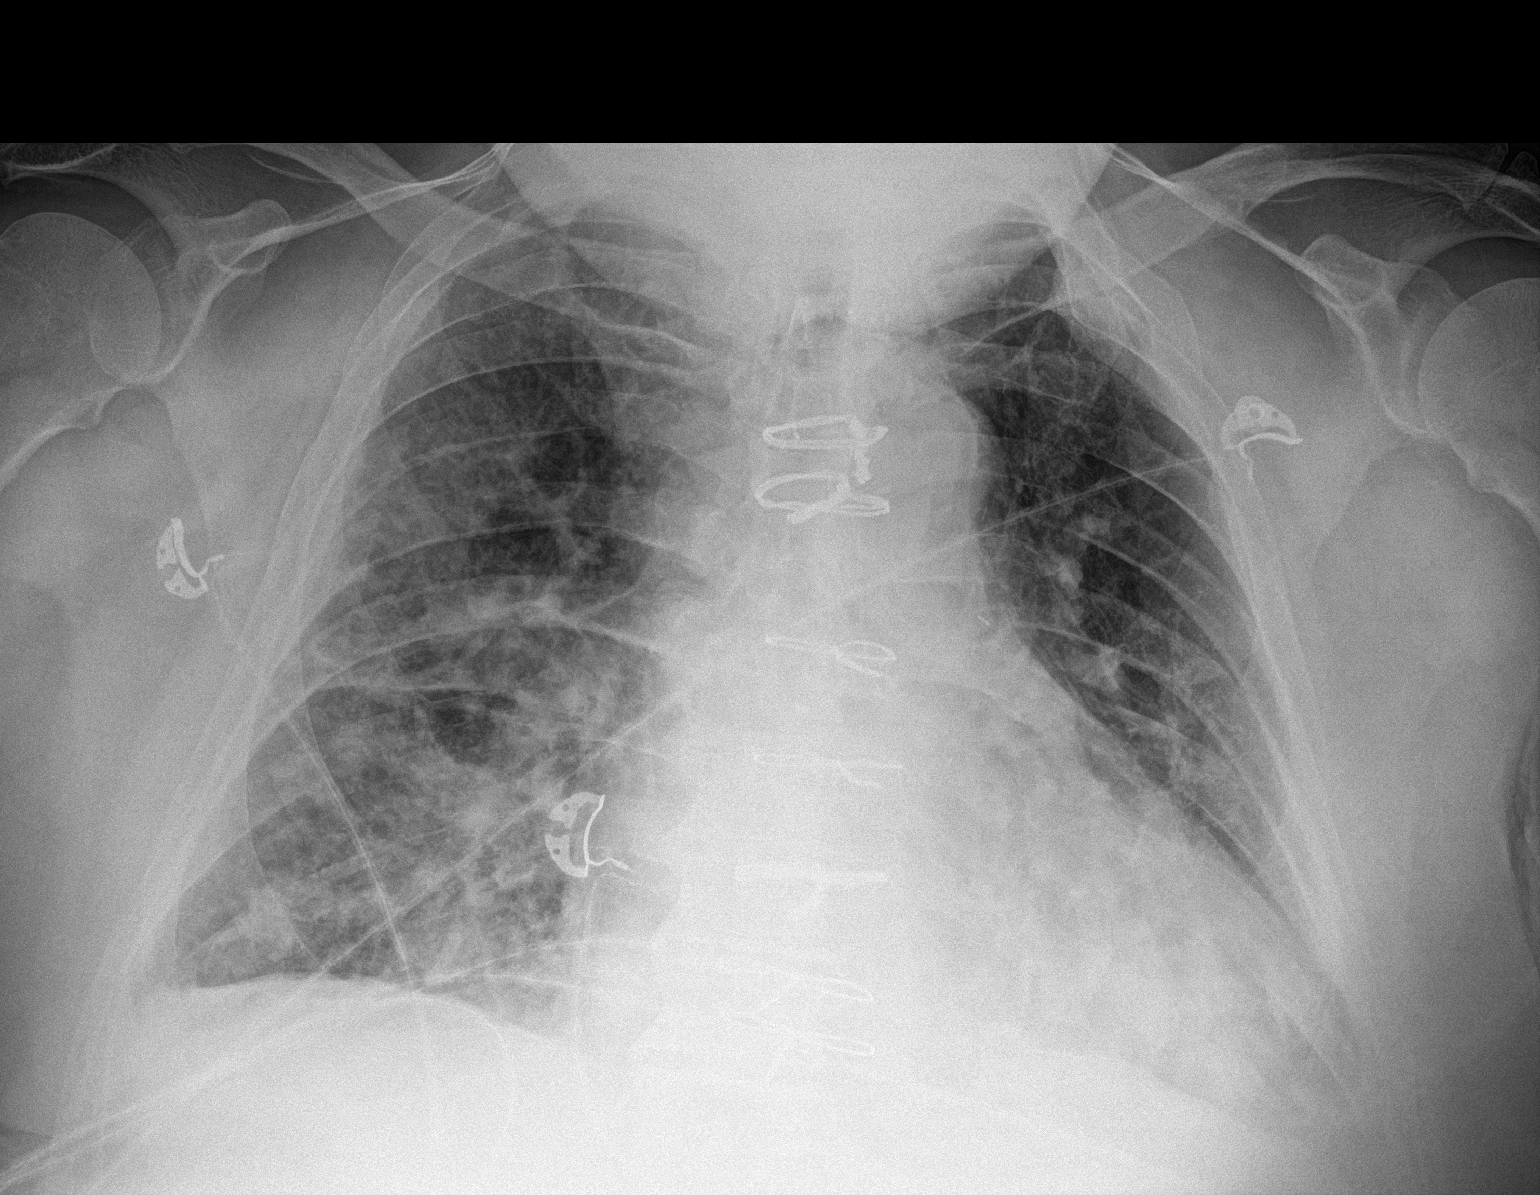

[1 of 1 positions shown; findings below may reference images not displayed]

FINDINGS: Stable cardiomegaly and vascular congestion. Patchy airspace
opacities in the right greater than left lungs are similar to prior
study. Unchanged small right pleural effusion. No pneumothorax. No
acute osseous abnormality.
IMPRESSION: Stable vascular congestion and patchy right greater than left
airspace opacities which could reflect edema or infection.

## 2023-11-18 DEATH — deceased
